# Patient Record
Sex: Female | Born: 1955
Health system: Southern US, Community
[De-identification: ages and names within clinical notes are randomized; demographics above are authoritative.]

## PROBLEM LIST (undated history)

## (undated) DIAGNOSIS — Z789 Other specified health status: Secondary | ICD-10-CM

## (undated) DIAGNOSIS — K7460 Unspecified cirrhosis of liver: Secondary | ICD-10-CM

## (undated) DIAGNOSIS — B182 Chronic viral hepatitis C: Secondary | ICD-10-CM

## (undated) DIAGNOSIS — D696 Thrombocytopenia, unspecified: Secondary | ICD-10-CM

## (undated) DIAGNOSIS — R7989 Other specified abnormal findings of blood chemistry: Secondary | ICD-10-CM

## (undated) DIAGNOSIS — R945 Abnormal results of liver function studies: Secondary | ICD-10-CM

## (undated) DIAGNOSIS — E785 Hyperlipidemia, unspecified: Secondary | ICD-10-CM

## (undated) DIAGNOSIS — B1920 Unspecified viral hepatitis C without hepatic coma: Secondary | ICD-10-CM

## (undated) HISTORY — PX: ABDOMINAL HYSTERECTOMY: SHX81

## (undated) HISTORY — DX: Thrombocytopenia, unspecified: D69.6

## (undated) HISTORY — DX: Unspecified viral hepatitis C without hepatic coma: B19.20

## (undated) HISTORY — DX: Unspecified cirrhosis of liver: K74.60

## (undated) HISTORY — PX: PARTIAL HYSTERECTOMY: SHX80

## (undated) HISTORY — PX: APPENDECTOMY: SHX54

## (undated) HISTORY — DX: Chronic viral hepatitis C: B18.2

## (undated) HISTORY — DX: Hyperlipidemia, unspecified: E78.5

## (undated) HISTORY — DX: Other specified abnormal findings of blood chemistry: R79.89

## (undated) HISTORY — DX: Abnormal results of liver function studies: R94.5

---

## 1998-09-16 ENCOUNTER — Encounter: Payer: Self-pay | Admitting: Emergency Medicine

## 1998-09-16 ENCOUNTER — Emergency Department (HOSPITAL_COMMUNITY): Admission: EM | Admit: 1998-09-16 | Discharge: 1998-09-16 | Payer: Self-pay | Admitting: Emergency Medicine

## 1998-11-28 ENCOUNTER — Emergency Department (HOSPITAL_COMMUNITY): Admission: EM | Admit: 1998-11-28 | Discharge: 1998-11-28 | Payer: Self-pay | Admitting: Emergency Medicine

## 1998-11-28 ENCOUNTER — Encounter: Payer: Self-pay | Admitting: Emergency Medicine

## 1998-11-30 ENCOUNTER — Emergency Department (HOSPITAL_COMMUNITY): Admission: EM | Admit: 1998-11-30 | Discharge: 1998-11-30 | Payer: Self-pay | Admitting: *Deleted

## 2001-07-19 ENCOUNTER — Ambulatory Visit (HOSPITAL_COMMUNITY): Admission: RE | Admit: 2001-07-19 | Discharge: 2001-07-19 | Payer: Self-pay | Admitting: Internal Medicine

## 2001-07-19 ENCOUNTER — Encounter: Payer: Self-pay | Admitting: Internal Medicine

## 2002-01-31 ENCOUNTER — Encounter: Payer: Self-pay | Admitting: Emergency Medicine

## 2002-01-31 ENCOUNTER — Emergency Department (HOSPITAL_COMMUNITY): Admission: EM | Admit: 2002-01-31 | Discharge: 2002-01-31 | Payer: Self-pay

## 2002-06-13 ENCOUNTER — Encounter: Payer: Self-pay | Admitting: Internal Medicine

## 2002-06-13 ENCOUNTER — Ambulatory Visit (HOSPITAL_COMMUNITY): Admission: RE | Admit: 2002-06-13 | Discharge: 2002-06-13 | Payer: Self-pay | Admitting: Internal Medicine

## 2002-06-13 ENCOUNTER — Encounter (INDEPENDENT_AMBULATORY_CARE_PROVIDER_SITE_OTHER): Payer: Self-pay | Admitting: Specialist

## 2002-10-10 DIAGNOSIS — B182 Chronic viral hepatitis C: Secondary | ICD-10-CM

## 2002-10-10 HISTORY — DX: Chronic viral hepatitis C: B18.2

## 2002-10-11 ENCOUNTER — Ambulatory Visit (HOSPITAL_COMMUNITY): Admission: RE | Admit: 2002-10-11 | Discharge: 2002-10-11 | Payer: Self-pay | Admitting: Internal Medicine

## 2003-04-24 ENCOUNTER — Emergency Department (HOSPITAL_COMMUNITY): Admission: EM | Admit: 2003-04-24 | Discharge: 2003-04-24 | Payer: Self-pay | Admitting: Emergency Medicine

## 2003-04-25 ENCOUNTER — Emergency Department (HOSPITAL_COMMUNITY): Admission: EM | Admit: 2003-04-25 | Discharge: 2003-04-25 | Payer: Self-pay | Admitting: Emergency Medicine

## 2003-11-25 ENCOUNTER — Emergency Department (HOSPITAL_COMMUNITY): Admission: EM | Admit: 2003-11-25 | Discharge: 2003-11-25 | Payer: Self-pay | Admitting: Emergency Medicine

## 2003-11-26 ENCOUNTER — Emergency Department (HOSPITAL_COMMUNITY): Admission: EM | Admit: 2003-11-26 | Discharge: 2003-11-26 | Payer: Self-pay | Admitting: Emergency Medicine

## 2004-06-25 ENCOUNTER — Ambulatory Visit (HOSPITAL_COMMUNITY): Admission: RE | Admit: 2004-06-25 | Discharge: 2004-06-25 | Payer: Self-pay | Admitting: Internal Medicine

## 2004-11-15 ENCOUNTER — Emergency Department (HOSPITAL_COMMUNITY): Admission: EM | Admit: 2004-11-15 | Discharge: 2004-11-15 | Payer: Self-pay | Admitting: Emergency Medicine

## 2005-09-09 ENCOUNTER — Emergency Department (HOSPITAL_COMMUNITY): Admission: EM | Admit: 2005-09-09 | Discharge: 2005-09-09 | Payer: Self-pay | Admitting: Emergency Medicine

## 2006-02-07 ENCOUNTER — Emergency Department (HOSPITAL_COMMUNITY): Admission: EM | Admit: 2006-02-07 | Discharge: 2006-02-07 | Payer: Self-pay | Admitting: Emergency Medicine

## 2006-03-24 ENCOUNTER — Emergency Department (HOSPITAL_COMMUNITY): Admission: EM | Admit: 2006-03-24 | Discharge: 2006-03-24 | Payer: Self-pay | Admitting: Emergency Medicine

## 2006-05-24 ENCOUNTER — Ambulatory Visit (HOSPITAL_COMMUNITY): Admission: RE | Admit: 2006-05-24 | Discharge: 2006-05-24 | Payer: Self-pay | Admitting: Family Medicine

## 2006-08-25 ENCOUNTER — Emergency Department (HOSPITAL_COMMUNITY): Admission: EM | Admit: 2006-08-25 | Discharge: 2006-08-25 | Payer: Self-pay | Admitting: Emergency Medicine

## 2006-10-01 ENCOUNTER — Emergency Department (HOSPITAL_COMMUNITY): Admission: EM | Admit: 2006-10-01 | Discharge: 2006-10-01 | Payer: Self-pay | Admitting: Family Medicine

## 2006-10-31 ENCOUNTER — Ambulatory Visit: Payer: Self-pay | Admitting: Gastroenterology

## 2007-02-23 ENCOUNTER — Ambulatory Visit (HOSPITAL_COMMUNITY): Admission: RE | Admit: 2007-02-23 | Discharge: 2007-02-23 | Payer: Self-pay | Admitting: Gastroenterology

## 2007-02-23 ENCOUNTER — Encounter (INDEPENDENT_AMBULATORY_CARE_PROVIDER_SITE_OTHER): Payer: Self-pay | Admitting: Interventional Radiology

## 2007-03-02 ENCOUNTER — Emergency Department (HOSPITAL_COMMUNITY): Admission: EM | Admit: 2007-03-02 | Discharge: 2007-03-02 | Payer: Self-pay | Admitting: Emergency Medicine

## 2007-05-29 ENCOUNTER — Ambulatory Visit (HOSPITAL_COMMUNITY): Admission: RE | Admit: 2007-05-29 | Discharge: 2007-05-29 | Payer: Self-pay | Admitting: Internal Medicine

## 2007-08-03 ENCOUNTER — Emergency Department (HOSPITAL_COMMUNITY): Admission: EM | Admit: 2007-08-03 | Discharge: 2007-08-03 | Payer: Self-pay | Admitting: *Deleted

## 2007-08-24 ENCOUNTER — Emergency Department (HOSPITAL_COMMUNITY): Admission: EM | Admit: 2007-08-24 | Discharge: 2007-08-24 | Payer: Self-pay | Admitting: Emergency Medicine

## 2008-05-29 ENCOUNTER — Ambulatory Visit (HOSPITAL_COMMUNITY): Admission: RE | Admit: 2008-05-29 | Discharge: 2008-05-29 | Payer: Self-pay | Admitting: Internal Medicine

## 2008-11-10 ENCOUNTER — Emergency Department (HOSPITAL_COMMUNITY): Admission: EM | Admit: 2008-11-10 | Discharge: 2008-11-10 | Payer: Self-pay | Admitting: Emergency Medicine

## 2009-05-29 ENCOUNTER — Ambulatory Visit (HOSPITAL_COMMUNITY): Admission: RE | Admit: 2009-05-29 | Discharge: 2009-05-29 | Payer: Self-pay | Admitting: Internal Medicine

## 2010-06-01 ENCOUNTER — Ambulatory Visit (HOSPITAL_COMMUNITY): Admission: RE | Admit: 2010-06-01 | Discharge: 2010-06-01 | Payer: Self-pay | Admitting: Internal Medicine

## 2010-10-31 ENCOUNTER — Encounter: Payer: Self-pay | Admitting: Gastroenterology

## 2010-10-31 ENCOUNTER — Encounter: Payer: Self-pay | Admitting: Family Medicine

## 2010-11-14 ENCOUNTER — Emergency Department (HOSPITAL_COMMUNITY): Payer: 59

## 2010-11-14 ENCOUNTER — Emergency Department (HOSPITAL_COMMUNITY)
Admission: EM | Admit: 2010-11-14 | Discharge: 2010-11-14 | Disposition: A | Discharge status: Home / Self Care | Payer: 59 | Attending: Emergency Medicine | Admitting: Emergency Medicine

## 2010-11-14 DIAGNOSIS — R05 Cough: Secondary | ICD-10-CM | POA: Insufficient documentation

## 2010-11-14 DIAGNOSIS — J069 Acute upper respiratory infection, unspecified: Secondary | ICD-10-CM | POA: Insufficient documentation

## 2010-11-14 DIAGNOSIS — R509 Fever, unspecified: Secondary | ICD-10-CM | POA: Insufficient documentation

## 2010-11-14 DIAGNOSIS — R0602 Shortness of breath: Secondary | ICD-10-CM | POA: Insufficient documentation

## 2010-11-14 DIAGNOSIS — R059 Cough, unspecified: Secondary | ICD-10-CM | POA: Insufficient documentation

## 2010-11-14 DIAGNOSIS — Z8619 Personal history of other infectious and parasitic diseases: Secondary | ICD-10-CM | POA: Insufficient documentation

## 2011-03-29 ENCOUNTER — Emergency Department (HOSPITAL_COMMUNITY): Payer: Self-pay

## 2011-03-29 ENCOUNTER — Emergency Department (HOSPITAL_COMMUNITY)
Admission: EM | Admit: 2011-03-29 | Discharge: 2011-03-29 | Disposition: A | Discharge status: Home / Self Care | Payer: Self-pay | Attending: Emergency Medicine | Admitting: Emergency Medicine

## 2011-03-29 DIAGNOSIS — R059 Cough, unspecified: Secondary | ICD-10-CM | POA: Insufficient documentation

## 2011-03-29 DIAGNOSIS — J029 Acute pharyngitis, unspecified: Secondary | ICD-10-CM | POA: Insufficient documentation

## 2011-03-29 DIAGNOSIS — J069 Acute upper respiratory infection, unspecified: Secondary | ICD-10-CM | POA: Insufficient documentation

## 2011-03-29 DIAGNOSIS — R05 Cough: Secondary | ICD-10-CM | POA: Insufficient documentation

## 2011-03-29 DIAGNOSIS — Z8619 Personal history of other infectious and parasitic diseases: Secondary | ICD-10-CM | POA: Insufficient documentation

## 2011-03-29 LAB — RAPID STREP SCREEN (MED CTR MEBANE ONLY): Streptococcus, Group A Screen (Direct): NEGATIVE

## 2011-04-22 ENCOUNTER — Other Ambulatory Visit (HOSPITAL_COMMUNITY): Payer: Self-pay | Admitting: Internal Medicine

## 2011-04-22 DIAGNOSIS — Z1231 Encounter for screening mammogram for malignant neoplasm of breast: Secondary | ICD-10-CM

## 2011-06-03 ENCOUNTER — Ambulatory Visit (HOSPITAL_COMMUNITY)
Admission: RE | Admit: 2011-06-03 | Discharge: 2011-06-03 | Disposition: A | Discharge status: Home / Self Care | Payer: Self-pay | Source: Ambulatory Visit | Attending: Internal Medicine | Admitting: Internal Medicine

## 2011-06-03 DIAGNOSIS — Z1231 Encounter for screening mammogram for malignant neoplasm of breast: Secondary | ICD-10-CM | POA: Insufficient documentation

## 2011-06-17 LAB — HM COLONOSCOPY

## 2011-08-26 ENCOUNTER — Emergency Department (HOSPITAL_COMMUNITY)
Admission: EM | Admit: 2011-08-26 | Discharge: 2011-08-26 | Disposition: A | Discharge status: Home / Self Care | Payer: Self-pay | Attending: Emergency Medicine | Admitting: Emergency Medicine

## 2011-08-26 ENCOUNTER — Encounter: Payer: Self-pay | Admitting: Emergency Medicine

## 2011-08-26 ENCOUNTER — Emergency Department (HOSPITAL_COMMUNITY): Payer: Self-pay

## 2011-08-26 DIAGNOSIS — R0602 Shortness of breath: Secondary | ICD-10-CM | POA: Insufficient documentation

## 2011-08-26 DIAGNOSIS — IMO0001 Reserved for inherently not codable concepts without codable children: Secondary | ICD-10-CM | POA: Insufficient documentation

## 2011-08-26 DIAGNOSIS — R51 Headache: Secondary | ICD-10-CM | POA: Insufficient documentation

## 2011-08-26 DIAGNOSIS — R059 Cough, unspecified: Secondary | ICD-10-CM | POA: Insufficient documentation

## 2011-08-26 DIAGNOSIS — R509 Fever, unspecified: Secondary | ICD-10-CM | POA: Insufficient documentation

## 2011-08-26 DIAGNOSIS — J069 Acute upper respiratory infection, unspecified: Secondary | ICD-10-CM | POA: Insufficient documentation

## 2011-08-26 DIAGNOSIS — R05 Cough: Secondary | ICD-10-CM | POA: Insufficient documentation

## 2011-08-26 MED ORDER — ALBUTEROL SULFATE HFA 108 (90 BASE) MCG/ACT IN AERS: 2.0000 | INHALATION_SPRAY | RESPIRATORY_TRACT | Status: DC | PRN

## 2011-08-26 MED ORDER — HYDROCOD POLST-CHLORPHEN POLST 10-8 MG/5ML PO LQCR: 5.0000 mL | Freq: Every evening | ORAL | Status: DC | PRN

## 2011-08-26 NOTE — ED Provider Notes (Signed)
History     CSN: [id number] Arrival date & time: 08/26/2011  3:39 PM   First MD Initiated Contact with Patient 08/26/11 1705      Chief Complaint  Patient presents with  . URI    (Consider location/radiation/quality/duration/timing/severity/associated sxs/prior treatment) HPI Comments: Patient reports body aches, headache, subjective fever, cough and SOB x 4 days.  Cough is sometimes productive of yellow sputum.  States this is similar to URI and bronchitis she has had in the past but is worried she has pneumonia because she recently had friends die of pneumonia.  Has been taking alka seltzer, nyquil, dayquil, and vitamin C without relief.  Has had sick contacts with similar symptoms at work.     Patient is a 55 y.o. female presenting with URI. The history is provided by the patient. No language interpreter was used.  URI The primary symptoms include headaches, cough and myalgias. The problem has not changed since onset. The onset of the illness is associated with exposure to sick contacts.    History reviewed. No pertinent past medical history.  History reviewed. No pertinent past surgical history.  History reviewed. No pertinent family history.  History  Substance Use Topics  . Smoking status: Former Games developer  . Smokeless tobacco: Not on file  . Alcohol Use: No    OB History    Grav Para Term Preterm Abortions TAB SAB Ect Mult Living                  Review of Systems  Respiratory: Positive for cough.   Musculoskeletal: Positive for myalgias.  Neurological: Positive for headaches.  All other systems reviewed and are negative.    Allergies  Penicillins  Home Medications   Current Outpatient Rx  Name Route Sig Dispense Refill  . ASPIRIN EFFERVESCENT 325 MG PO TBEF Oral Take 325 mg by mouth daily as needed. As needed for cough/cold.     Doreatha Martin COLD & FLU PO Oral Take 1 capsule by mouth daily as needed. As needed for cough/cold.     Marland Kitchen ERGOCALCIFEROL 50000  UNITS PO CAPS Oral Take 50,000 Units by mouth 2 (two) times a week. Tuesday and Thursday.     . DAYQUIL PO Oral Take 1 capsule by mouth daily as needed. As needed for cough/cold.       BP 118/90  Pulse 69  Temp(Src) 98.2 F (36.8 C) (Oral)  Resp 18  SpO2 100%  Physical Exam  Constitutional: She is oriented to person, place, and time. She appears well-developed and well-nourished.  HENT:  Mouth/Throat: Uvula is midline. No uvula swelling. No oropharyngeal exudate, posterior oropharyngeal edema or tonsillar abscesses.  Neck: Trachea normal, normal range of motion and phonation normal. Neck supple. No tracheal tenderness present. No rigidity. No tracheal deviation and normal range of motion present.  Cardiovascular: Normal rate and regular rhythm.   Pulmonary/Chest: Effort normal and breath sounds normal. No stridor. No respiratory distress. She has no wheezes. She has no rales.  Neurological: She is alert and oriented to person, place, and time.    ED Course  Procedures (including critical care time)  Labs Reviewed - No data to display Dg Chest 2 View  08/26/2011  *RADIOLOGY REPORT*  Clinical Data: Cough and shortness of breath  CHEST - 2 VIEW  Comparison: 03/29/2011  Findings: Cardiomediastinal silhouette is within normal limits. The lungs are clear. No pleural effusion.  No pneumothorax.  No acute osseous abnormality.  IMPRESSION: No acute cardiopulmonary process.  Original  Report Authenticated By: Harrel Lemon, M.D.     1. URI (upper respiratory infection)       MDM  Patient with cough and chills, body aches x 4 days + sick contacts, CXR clear.  No pneumonia.          Dillard Cannon [place], [place] 08/26/11 [phone]

## 2011-08-26 NOTE — ED Notes (Signed)
Pt discharged home. Had no further questions regarding discharge.  

## 2011-08-26 NOTE — ED Notes (Signed)
Pt presents to department for evaluation of generalized body aches, upper chest congestion, fever and productive cough. Ongoing since Sunday, no relief with OTC medications. Pt states 6/10 body aches. She is alert and oriented x4. No signs of distress.

## 2011-08-26 NOTE — ED Notes (Signed)
C/o productive cough with yellow sputum, headache, and generalized body aches since Sunday.  Taking OTC meds without relief.

## 2011-08-27 NOTE — ED Provider Notes (Signed)
Evaluation and management procedures were performed by the PA/NP under my supervision/collaboration.    Felisa Bonier, MD 08/27/11 [phone]

## 2011-09-20 ENCOUNTER — Encounter (HOSPITAL_COMMUNITY): Payer: Self-pay | Admitting: *Deleted

## 2011-09-20 ENCOUNTER — Emergency Department (HOSPITAL_COMMUNITY)
Admission: EM | Admit: 2011-09-20 | Discharge: 2011-09-20 | Disposition: A | Discharge status: Home / Self Care | Payer: BC Managed Care – PPO | Attending: Emergency Medicine | Admitting: Emergency Medicine

## 2011-09-20 DIAGNOSIS — R6889 Other general symptoms and signs: Secondary | ICD-10-CM | POA: Insufficient documentation

## 2011-09-20 DIAGNOSIS — J40 Bronchitis, not specified as acute or chronic: Secondary | ICD-10-CM

## 2011-09-20 DIAGNOSIS — R059 Cough, unspecified: Secondary | ICD-10-CM | POA: Insufficient documentation

## 2011-09-20 DIAGNOSIS — J111 Influenza due to unidentified influenza virus with other respiratory manifestations: Secondary | ICD-10-CM

## 2011-09-20 DIAGNOSIS — R05 Cough: Secondary | ICD-10-CM | POA: Insufficient documentation

## 2011-09-20 MED ORDER — HYDROCOD POLST-CHLORPHEN POLST 10-8 MG/5ML PO LQCR: 5.0000 mL | Freq: Two times a day (BID) | ORAL | Status: DC

## 2011-09-20 MED ORDER — ALBUTEROL SULFATE HFA 108 (90 BASE) MCG/ACT IN AERS: 1.0000 | INHALATION_SPRAY | Freq: Four times a day (QID) | RESPIRATORY_TRACT | Status: DC | PRN

## 2011-09-20 NOTE — ED Notes (Signed)
Pt states that for 2 weeks she has had coughing, congestion, headaches, and body aches; fever at times. Has been taking OTC meds without any relief.

## 2011-09-20 NOTE — ED Provider Notes (Signed)
History     CSN: [id number] Arrival date & time: 09/20/2011  8:35 AM   First MD Initiated Contact with Patient 09/20/11 [phone]      No chief complaint on file.   CC:  Cough and congestion   55 year old female who reports 2 weeks of cough, congestion, stuffy nose, body aches. She was seen in the ER on November 16 and had a chest x-ray. That was normal. Patient states that she works at a [employer] and is exposed to hot and cold conditions and potential sick contacts. She has had no documented fevers, no chest pain, note, shortness of breath. She is using over-the-counter medications, and albuterol with intermittent relief. Denies any vomiting. She does complain of mild sore throat. Denies any neck pain.     (Consider location/radiation/quality/duration/timing/severity/associated sxs/prior treatment) Patient is a 55 y.o. female presenting with cough.  Cough    No past medical history on file.  No past surgical history on file.  No family history on file.  History  Substance Use Topics  . Smoking status: Former Games developer  . Smokeless tobacco: Not on file  . Alcohol Use: No    OB History    Grav Para Term Preterm Abortions TAB SAB Ect Mult Living                  Review of Systems  Respiratory: Positive for cough.   All other systems reviewed and are negative.    Allergies  Penicillins  Home Medications   Current Outpatient Rx  Name Route Sig Dispense Refill  . ALBUTEROL SULFATE HFA 108 (90 BASE) MCG/ACT IN AERS Inhalation Inhale 2 puffs into the lungs every 4 (four) hours as needed for wheezing. 1 Inhaler 0  . ASPIRIN EFFERVESCENT 325 MG PO TBEF Oral Take 325 mg by mouth daily as needed. As needed for cough/cold.     Marland Kitchen HYDROCOD POLST-CHLORPHEN POLST 10-8 MG/5ML PO LQCR Oral Take 5 mLs by mouth at bedtime as needed (cough). 60 mL 0  . NYQUIL COLD & FLU PO Oral Take 1 capsule by mouth daily as needed. As needed for cough/cold.     Marland Kitchen ERGOCALCIFEROL 50000 UNITS PO  CAPS Oral Take 50,000 Units by mouth 2 (two) times a week. Tuesday and Thursday.     . DAYQUIL PO Oral Take 1 capsule by mouth daily as needed. As needed for cough/cold.       There were no vitals taken for this visit.  Physical Exam  Nursing note and vitals reviewed. Constitutional: She is oriented to person, place, and time. She appears well-developed and well-nourished. No distress.  HENT:  Head: Normocephalic and atraumatic.  Right Ear: External ear normal.  Left Ear: External ear normal.  Mouth/Throat: Oropharynx is clear and moist. No oropharyngeal exudate.  Eyes: Conjunctivae and EOM are normal. Pupils are equal, round, and reactive to light. Left eye exhibits no discharge.  Neck: Neck supple.  Cardiovascular: Normal rate and regular rhythm.  Exam reveals no gallop and no friction rub.   No murmur heard. Pulmonary/Chest: Breath sounds normal. No respiratory distress. She has no wheezes. She has no rales. She exhibits no tenderness.       Occasional dry cough. Otherwise, lungs are clear to auscultation. No wheezing, rales, or rhonchi. No retractions. No tachypnea. Overall breathing is comfortable and nonlabored.  Abdominal: Soft. Bowel sounds are normal. She exhibits no distension. There is no tenderness. There is no rebound and no guarding.  Musculoskeletal: Normal range of motion.  Lymphadenopathy:    She has no cervical adenopathy.  Neurological: She is alert and oriented to person, place, and time. No cranial nerve deficit. Coordination normal.  Skin: Skin is warm and dry. No rash noted.  Psychiatric: She has a normal mood and affect.    ED Course  Procedures (including critical care time)  Labs Reviewed - No data to display No results found.   No diagnosis found.    MDM  Pt is seen and examined;  Initial history and physical completed.  Will follow.   Previous chart and records have been reviewed thoroughly.  Clinical Data: Cough and shortness of breath  CHEST  - 2 VIEW  Comparison: 03/29/2011  Findings: Cardiomediastinal silhouette is within normal limits. The  lungs are clear. No pleural effusion. No pneumothorax. No acute  osseous abnormality.  IMPRESSION:  No acute cardiopulmonary process.  Original Report Authenticated By: Harrel Lemon, M.D.         Janel Beane A. Patrica Duel, MD 09/20/11 [phone]

## 2011-10-11 HISTORY — PX: COLONOSCOPY: SHX5424

## 2012-04-25 ENCOUNTER — Other Ambulatory Visit (HOSPITAL_COMMUNITY): Payer: Self-pay | Admitting: Internal Medicine

## 2012-04-25 DIAGNOSIS — Z1231 Encounter for screening mammogram for malignant neoplasm of breast: Secondary | ICD-10-CM

## 2012-06-04 ENCOUNTER — Ambulatory Visit (HOSPITAL_COMMUNITY)
Admission: RE | Admit: 2012-06-04 | Discharge: 2012-06-04 | Disposition: A | Discharge status: Home / Self Care | Payer: BC Managed Care – PPO | Source: Ambulatory Visit | Attending: Internal Medicine | Admitting: Internal Medicine

## 2012-06-04 DIAGNOSIS — Z1231 Encounter for screening mammogram for malignant neoplasm of breast: Secondary | ICD-10-CM

## 2012-11-15 ENCOUNTER — Emergency Department (HOSPITAL_COMMUNITY)
Admission: EM | Admit: 2012-11-15 | Discharge: 2012-11-15 | Disposition: A | Discharge status: Home / Self Care | Payer: BC Managed Care – PPO | Attending: Emergency Medicine | Admitting: Emergency Medicine

## 2012-11-15 ENCOUNTER — Emergency Department (HOSPITAL_COMMUNITY): Payer: BC Managed Care – PPO

## 2012-11-15 DIAGNOSIS — Z79899 Other long term (current) drug therapy: Secondary | ICD-10-CM | POA: Insufficient documentation

## 2012-11-15 DIAGNOSIS — R079 Chest pain, unspecified: Secondary | ICD-10-CM | POA: Insufficient documentation

## 2012-11-15 DIAGNOSIS — R509 Fever, unspecified: Secondary | ICD-10-CM | POA: Insufficient documentation

## 2012-11-15 DIAGNOSIS — J029 Acute pharyngitis, unspecified: Secondary | ICD-10-CM | POA: Insufficient documentation

## 2012-11-15 DIAGNOSIS — R059 Cough, unspecified: Secondary | ICD-10-CM | POA: Insufficient documentation

## 2012-11-15 DIAGNOSIS — R5381 Other malaise: Secondary | ICD-10-CM | POA: Insufficient documentation

## 2012-11-15 DIAGNOSIS — J069 Acute upper respiratory infection, unspecified: Secondary | ICD-10-CM

## 2012-11-15 DIAGNOSIS — R05 Cough: Secondary | ICD-10-CM | POA: Insufficient documentation

## 2012-11-15 DIAGNOSIS — Z87891 Personal history of nicotine dependence: Secondary | ICD-10-CM | POA: Insufficient documentation

## 2012-11-15 DIAGNOSIS — R5383 Other fatigue: Secondary | ICD-10-CM | POA: Insufficient documentation

## 2012-11-15 MED ORDER — IBUPROFEN 800 MG PO TABS: 800.0000 mg | ORAL_TABLET | Freq: Three times a day (TID) | ORAL | Status: DC | PRN

## 2012-11-15 MED ORDER — PREDNISONE 50 MG PO TABS: 50.0000 mg | ORAL_TABLET | Freq: Every day | ORAL | Status: DC

## 2012-11-15 MED ORDER — PROMETHAZINE-DM 6.25-15 MG/5ML PO SYRP: 5.0000 mL | ORAL_SOLUTION | Freq: Four times a day (QID) | ORAL | Status: DC | PRN

## 2012-11-15 MED ORDER — GUAIFENESIN ER 1200 MG PO TB12: 1.0000 | ORAL_TABLET | Freq: Two times a day (BID) | ORAL | Status: DC

## 2012-11-15 MED ORDER — ACETAMINOPHEN-CODEINE 120-12 MG/5ML PO SOLN: 10.0000 mL | ORAL | Status: DC | PRN

## 2012-11-15 NOTE — ED Notes (Signed)
Patient with cough, fever, pain in chest and ribs from coughing according to patient.  Denies nausea, vomiting.  Coughing up yellowish green sputum.

## 2012-11-15 NOTE — ED Provider Notes (Signed)
History     CSN: [id number]  Arrival date & time 11/15/12  1604   First MD Initiated Contact with Patient 11/15/12 1738      Chief Complaint  Patient presents with  . Cough    (Consider location/radiation/quality/duration/timing/severity/associated sxs/prior treatment) Patient is a 57 y.o. female presenting with cough.  Cough   Patient is a 57 year old female who presents today with a chief complaint of cough.  She has had this cough since Sunday.  Initially it was a dry cough and now she is producing some phlegm but no blood.  Also complains of chest soreness/pain increased with coughing and deep breaths.  Between dull and sharp pain.  No history of hypertension, hyperlipidemia or previous heart attacks.  States that she feels weak, has a sore throat and has noticed that she has been getting hot and cold.  Denies nausea, vomiting, diarrhea, constipation or anyone around her being sick.    No past medical history on file.  Past Surgical History  Procedure Date  . Abdominal hysterectomy   . Appendectomy     Family History  Problem Relation Age of Onset  . Hypertension Mother     History  Substance Use Topics  . Smoking status: Former Games developer  . Smokeless tobacco: Never Used  . Alcohol Use: No    OB History    Grav Para Term Preterm Abortions TAB SAB Ect Mult Living                  Review of Systems  Respiratory: Positive for cough.    All other systems negative except as documented in the HPI. All pertinent positives and negatives as reviewed in the HPI.  Allergies  Hydrocodone and Penicillins  Home Medications   Current Outpatient Rx  Name  Route  Sig  Dispense  Refill  . ASPIRIN EFFERVESCENT 325 MG PO TBEF   Oral   Take 325 mg by mouth daily as needed. As needed for cough/cold.          Doreatha Martin COLD & FLU PO   Oral   Take 1 capsule by mouth daily as needed. As needed for cough/cold.          Juanita Laster COLD & COUGH PO   Oral   Take 1 packet by  mouth daily as needed. For cold symptoms          . DAYQUIL PO   Oral   Take 1 capsule by mouth daily as needed. As needed for cough/cold.          . ALBUTEROL SULFATE HFA 108 (90 BASE) MCG/ACT IN AERS   Inhalation   Inhale 2 puffs into the lungs every 4 (four) hours as needed for wheezing.   1 Inhaler   0   . ALBUTEROL SULFATE HFA 108 (90 BASE) MCG/ACT IN AERS   Inhalation   Inhale 1-2 puffs into the lungs every 6 (six) hours as needed for wheezing.   1 Inhaler   0     BP 117/82  Pulse 87  Temp 98.3 F (36.8 C) (Oral)  Resp 20  Wt 135 lb (61.236 kg)  SpO2 100%  Physical Exam  Constitutional: She appears well-developed and well-nourished.  HENT:  Head: Normocephalic and atraumatic.  Right Ear: Hearing, tympanic membrane, external ear and ear canal normal.  Left Ear: Hearing, tympanic membrane, external ear and ear canal normal.  Nose: Nose normal.  Mouth/Throat: Uvula is midline, oropharynx is clear and  moist and mucous membranes are normal.  Neck:    Cardiovascular: Normal heart sounds.   Pulmonary/Chest: Effort normal. She has no wheezes. She has no rhonchi. She has no rales.  Lymphadenopathy:    She has no cervical adenopathy.    ED Course  Procedures (including critical care time)  Return here as needed. Increase your fluid intake. The patient most likely has URI based on HPI and PE.   MDM          Carlyle Dolly, PA-C 11/17/12 1506

## 2012-11-19 NOTE — ED Provider Notes (Signed)
Medical screening examination/treatment/procedure(s) were performed by non-physician practitioner and as supervising physician I was immediately available for consultation/collaboration.  Gilda Crease, MD 11/19/12 1318

## 2013-05-14 ENCOUNTER — Other Ambulatory Visit (HOSPITAL_COMMUNITY): Payer: Self-pay | Admitting: Internal Medicine

## 2013-05-14 DIAGNOSIS — Z1231 Encounter for screening mammogram for malignant neoplasm of breast: Secondary | ICD-10-CM

## 2013-06-05 ENCOUNTER — Ambulatory Visit (HOSPITAL_COMMUNITY)
Admission: RE | Admit: 2013-06-05 | Discharge: 2013-06-05 | Disposition: A | Discharge status: Home / Self Care | Payer: BC Managed Care – PPO | Source: Ambulatory Visit | Attending: Internal Medicine | Admitting: Internal Medicine

## 2013-06-05 ENCOUNTER — Ambulatory Visit (HOSPITAL_COMMUNITY): Payer: BC Managed Care – PPO

## 2013-06-05 DIAGNOSIS — Z1231 Encounter for screening mammogram for malignant neoplasm of breast: Secondary | ICD-10-CM | POA: Insufficient documentation

## 2013-07-05 ENCOUNTER — Encounter (HOSPITAL_COMMUNITY): Payer: Self-pay | Admitting: *Deleted

## 2013-07-05 ENCOUNTER — Inpatient Hospital Stay (HOSPITAL_COMMUNITY)
Admission: AD | Admit: 2013-07-05 | Discharge: 2013-07-05 | Disposition: A | Discharge status: Home / Self Care | Payer: BC Managed Care – PPO | Source: Ambulatory Visit | Attending: Obstetrics & Gynecology | Admitting: Obstetrics & Gynecology

## 2013-07-05 DIAGNOSIS — J069 Acute upper respiratory infection, unspecified: Secondary | ICD-10-CM | POA: Insufficient documentation

## 2013-07-05 DIAGNOSIS — B9789 Other viral agents as the cause of diseases classified elsewhere: Secondary | ICD-10-CM | POA: Insufficient documentation

## 2013-07-05 DIAGNOSIS — R059 Cough, unspecified: Secondary | ICD-10-CM | POA: Insufficient documentation

## 2013-07-05 DIAGNOSIS — R05 Cough: Secondary | ICD-10-CM | POA: Insufficient documentation

## 2013-07-05 HISTORY — DX: Other specified health status: Z78.9

## 2013-07-05 MED ORDER — GUAIFENESIN-CODEINE 100-10 MG/5ML PO SOLN: 5.0000 mL | Freq: Three times a day (TID) | ORAL | Status: DC | PRN

## 2013-07-05 MED ORDER — GUAIFENESIN-CODEINE 100-10 MG/5ML PO SOLN
5.0000 mL | Freq: Once | ORAL | Status: AC
  Administered 2013-07-05: 5 mL via ORAL
  Filled 2013-07-05: qty 5

## 2013-07-05 NOTE — MAU Provider Note (Signed)
Attestation of Attending Supervision of Advanced Practitioner (CNM/NP): Evaluation and management procedures were performed by the Advanced Practitioner under my supervision and collaboration.  I have reviewed the Advanced Practitioner's note and chart, and I agree with the management and plan.  HARRAWAY-SMITH, Shah Insley 3:50 PM     

## 2013-07-05 NOTE — MAU Provider Note (Signed)
  History     CSN: [id number]  Arrival date and time: 07/05/13 1417   First Provider Initiated Contact with Patient 07/05/13 1508      Chief Complaint  Patient presents with  . Cough   Cough    Taylor Santiago is a 57 y.o. who presents today with cough since Wednesday. She has had sneezing, runny nose and sore throat for one week. She has been taking Nyquil, Alka-seltzer cold, Dayquil, Delsym. She has also been drinking hot tea. She states that she gets a respiratory infection about twice a year. She goes to [facility]. She has to ride the bus, and wanted to come to the closest place.   Past Medical History  Diagnosis Date  . Medical history non-contributory     Past Surgical History  Procedure Laterality Date  . Abdominal hysterectomy    . Appendectomy      Family History  Problem Relation Age of Onset  . Hypertension Mother     History  Substance Use Topics  . Smoking status: Former Games developer  . Smokeless tobacco: Never Used  . Alcohol Use: No    Allergies:  Allergies  Allergen Reactions  . Hydrocodone Itching  . Penicillins Hives    sweats    Prescriptions prior to admission  Medication Sig Dispense Refill  . albuterol (PROVENTIL HFA;VENTOLIN HFA) 108 (90 BASE) MCG/ACT inhaler Inhale 2 puffs into the lungs every 4 (four) hours as needed for wheezing.  1 Inhaler  0  . albuterol (PROVENTIL HFA;VENTOLIN HFA) 108 (90 BASE) MCG/ACT inhaler Inhale 1-2 puffs into the lungs every 6 (six) hours as needed for wheezing.  1 Inhaler  0  . aspirin-sod bicarb-citric acid (ALKA-SELTZER) 325 MG TBEF Take 325 mg by mouth daily as needed. As needed for cough/cold.       Marland Kitchen DM-Doxylamine-Acetaminophen (NYQUIL COLD & FLU PO) Take 1 capsule by mouth daily as needed. As needed for cough/cold.       . Guaifenesin 1200 MG TB12 Take 1 tablet (1,200 mg total) by mouth 2 (two) times daily.  20 each  0  . Phenylephrine-Pheniramine-DM (THERAFLU COLD & COUGH PO) Take 1 packet by  mouth daily as needed. For cold symptoms       . Pseudoephedrine-APAP-DM (DAYQUIL PO) Take 1 capsule by mouth daily as needed. As needed for cough/cold.         Review of Systems  Respiratory: Positive for cough.    Physical Exam   Blood pressure 126/91, pulse 93, temperature 98.4 F (36.9 C), temperature source Oral, resp. rate 16, SpO2 100.00%.  Physical Exam  Nursing note and vitals reviewed. Constitutional: She is oriented to person, place, and time. She appears well-developed and well-nourished. No distress.  Cardiovascular: Normal rate.   Respiratory: Effort normal. No respiratory distress. She has no wheezes. She has no rales. She exhibits no tenderness.  Lymphadenopathy:    She has no cervical adenopathy.  Neurological: She is alert and oriented to person, place, and time.  Skin: Skin is warm and dry.  Psychiatric: She has a normal mood and affect.    MAU Course  Procedures    Assessment and Plan   1. Viral URI with cough    Comfort measures Reviewed Antibiotic nonuse discussed FU with PCP if symptoms last longer than 10 days.    Tawnya Crook 07/05/2013, 3:14 PM

## 2013-07-05 NOTE — MAU Note (Signed)
Patient states she started having a sore throat about one week ago that has gotten progressively worse and now has a bad cough. Body aches. Has bronchitis every year.

## 2013-11-14 ENCOUNTER — Emergency Department (HOSPITAL_COMMUNITY)
Admission: EM | Admit: 2013-11-14 | Discharge: 2013-11-14 | Disposition: A | Discharge status: Home / Self Care | Payer: BC Managed Care – PPO | Attending: Emergency Medicine | Admitting: Emergency Medicine

## 2013-11-14 ENCOUNTER — Encounter (HOSPITAL_COMMUNITY): Payer: Self-pay | Admitting: Emergency Medicine

## 2013-11-14 DIAGNOSIS — J069 Acute upper respiratory infection, unspecified: Secondary | ICD-10-CM | POA: Insufficient documentation

## 2013-11-14 DIAGNOSIS — Z79899 Other long term (current) drug therapy: Secondary | ICD-10-CM | POA: Insufficient documentation

## 2013-11-14 DIAGNOSIS — B9789 Other viral agents as the cause of diseases classified elsewhere: Secondary | ICD-10-CM

## 2013-11-14 DIAGNOSIS — Z87891 Personal history of nicotine dependence: Secondary | ICD-10-CM | POA: Insufficient documentation

## 2013-11-14 DIAGNOSIS — Z880 Allergy status to penicillin: Secondary | ICD-10-CM | POA: Insufficient documentation

## 2013-11-14 LAB — RAPID STREP SCREEN (MED CTR MEBANE ONLY): Streptococcus, Group A Screen (Direct): NEGATIVE

## 2013-11-14 MED ORDER — HYDROCOD POLST-CHLORPHEN POLST 10-8 MG/5ML PO LQCR: 5.0000 mL | Freq: Two times a day (BID) | ORAL | Status: DC | PRN

## 2013-11-14 NOTE — ED Notes (Signed)
Pt with scratchy throat, body aches and upper respiratory congestion x 2 weeks.  Denies fevers nvd.  VS stable.

## 2013-11-14 NOTE — Discharge Instructions (Signed)
Take ibuprofen w/ food up to three times a day, as needed for pain, fever and chills.  Take sudafed as needed for nasal/sinus congestion.  Take tussionex at night as needed for cough and severe pain.  Do not drive within four hours of taking this medication (may cause drowsiness or confusion).    Get rest, drink plenty of fluids and wash hands often to prevent spread of infection.  You should return to the ER if your cough worsens, you develop difficulty breathing or pain in your chest or symptoms have not started to improve in 5-7days.

## 2013-11-14 NOTE — ED Provider Notes (Signed)
CSN: [id number]     Arrival date & time 11/14/13  1214 History   First MD Initiated Contact with Patient 11/14/13 1401     Chief Complaint  Patient presents with  . Generalized Body Aches  . URI   (Consider location/radiation/quality/duration/timing/severity/associated sxs/prior Treatment) HPI History provided by pt.   Pt presents w/ body aches x 5 days.  Associated w/ chills, headache, nasal congestion, rhinorrhea, sore throat, productive cough, generalized weakness and fatigue.  Denies dyspnea, chest pain, vomiting and diarrhea.  Has been taking several OTC cough and cough medications w/out relief.  No known sick contacts but takes the bus frequently.  No PMH.  Past Medical History  Diagnosis Date  . Medical history non-contributory    Past Surgical History  Procedure Laterality Date  . Abdominal hysterectomy    . Appendectomy     Family History  Problem Relation Age of Onset  . Hypertension Mother    History  Substance Use Topics  . Smoking status: Former Research scientist (life sciences)  . Smokeless tobacco: Never Used  . Alcohol Use: No   OB History   Grav Para Term Preterm Abortions TAB SAB Ect Mult Living   1 1        1      Review of Systems  All other systems reviewed and are negative.    Allergies  Hydrocodone and Penicillins  Home Medications   Current Outpatient Rx  Name  Route  Sig  Dispense  Refill  . albuterol (PROVENTIL HFA;VENTOLIN HFA) 108 (90 BASE) MCG/ACT inhaler   Inhalation   Inhale 2 puffs into the lungs every 6 (six) hours as needed for wheezing or shortness of breath (rescue).         Marland Kitchen aspirin-sod bicarb-citric acid (ALKA-SELTZER) 325 MG TBEF   Oral   Take 325 mg by mouth at bedtime as needed (for cold/flu symptoms).          . Chlorphen-Pseudoephed-APAP (THERAFLU FLU/COLD/SORE THROAT PO)   Oral   Take 1 packet by mouth daily as needed (for cold).         Marland Kitchen ibuprofen (ADVIL,MOTRIN) 200 MG tablet   Oral   Take 400 mg by mouth every 6 (six) hours as  needed.         . naproxen sodium (ANAPROX) 220 MG tablet   Oral   Take 440 mg by mouth daily as needed (for pain).         . Pseudoeph-Doxylamine-DM-APAP (NYQUIL MULTI-SYMPTOM PO)   Oral   Take 2 capsules by mouth daily as needed (for cold).          BP 145/104  Pulse 76  Temp(Src) 98.7 F (37.1 C) (Oral)  Resp 18  SpO2 99% Physical Exam  Nursing note and vitals reviewed. Constitutional: She is oriented to person, place, and time. She appears well-developed and well-nourished. No distress.  HENT:  Head: Normocephalic and atraumatic.  No erythema, edema or exudate of posterior pharynx or tonsils.  Uvula mid-line.  No trismus.    Eyes:  Normal appearance  Neck: Normal range of motion.  Cardiovascular: Normal rate and regular rhythm.   Pulmonary/Chest: Effort normal and breath sounds normal. No respiratory distress. She exhibits tenderness.  Musculoskeletal: Normal range of motion.  Tenderness of muscles of arms w/out guarding  Lymphadenopathy:    She has cervical adenopathy.  Neurological: She is alert and oriented to person, place, and time.  Skin: Skin is warm and dry. No rash noted.  Psychiatric: She has a  normal mood and affect. Her behavior is normal.    ED Course  Procedures (including critical care time) Labs Review Labs Reviewed  RAPID STREP SCREEN  CULTURE, GROUP A STREP   Imaging Review No results found.  EKG Interpretation   None       MDM   1. Viral URI with cough    Healthy 58yo F presents w/ respiratory illness x 5 days.  Afebrile, NAD, well-hydrated, unremarkable ENT, nml breath sounds on exam.  Suspect influenza.  Prescribed tussionex for cough and body aches and recommended tylenol/motrin prn, fluids, rest and sudafed.  Pt aware that triage BP elevated and that she should follow up with her PCP for recheck.  Return precautions discussed.       Remer Macho, PA-C 11/14/13 [phone]

## 2013-11-14 NOTE — ED Provider Notes (Signed)
Medical screening examination/treatment/procedure(s) were performed by non-physician practitioner and as supervising physician I was immediately available for consultation/collaboration.  EKG Interpretation   None       Rolland Porter, MD, Abram Sander   Janice Norrie, MD 11/14/13 [phone]

## 2013-11-14 NOTE — ED Notes (Signed)
Pt sts she has tried aleve, [employer], Nyquil, and theraflu without relief.

## 2013-11-16 LAB — CULTURE, GROUP A STREP

## 2014-03-12 ENCOUNTER — Other Ambulatory Visit: Payer: Self-pay | Admitting: Nurse Practitioner

## 2014-03-12 DIAGNOSIS — C220 Liver cell carcinoma: Secondary | ICD-10-CM

## 2014-04-25 ENCOUNTER — Other Ambulatory Visit: Payer: Self-pay

## 2014-04-25 ENCOUNTER — Emergency Department (HOSPITAL_COMMUNITY)
Admission: EM | Admit: 2014-04-25 | Discharge: 2014-04-25 | Disposition: A | Discharge status: Home / Self Care | Payer: BC Managed Care – PPO | Attending: Emergency Medicine | Admitting: Emergency Medicine

## 2014-04-25 ENCOUNTER — Encounter (HOSPITAL_COMMUNITY): Payer: Self-pay | Admitting: Emergency Medicine

## 2014-04-25 ENCOUNTER — Emergency Department (HOSPITAL_COMMUNITY): Payer: BC Managed Care – PPO

## 2014-04-25 DIAGNOSIS — R079 Chest pain, unspecified: Secondary | ICD-10-CM | POA: Insufficient documentation

## 2014-04-25 DIAGNOSIS — Z79899 Other long term (current) drug therapy: Secondary | ICD-10-CM | POA: Insufficient documentation

## 2014-04-25 DIAGNOSIS — Z791 Long term (current) use of non-steroidal anti-inflammatories (NSAID): Secondary | ICD-10-CM | POA: Insufficient documentation

## 2014-04-25 DIAGNOSIS — Z880 Allergy status to penicillin: Secondary | ICD-10-CM | POA: Insufficient documentation

## 2014-04-25 DIAGNOSIS — G5630 Lesion of radial nerve, unspecified upper limb: Secondary | ICD-10-CM | POA: Insufficient documentation

## 2014-04-25 DIAGNOSIS — G5632 Lesion of radial nerve, left upper limb: Secondary | ICD-10-CM

## 2014-04-25 DIAGNOSIS — R61 Generalized hyperhidrosis: Secondary | ICD-10-CM | POA: Insufficient documentation

## 2014-04-25 DIAGNOSIS — Z87891 Personal history of nicotine dependence: Secondary | ICD-10-CM | POA: Insufficient documentation

## 2014-04-25 LAB — BASIC METABOLIC PANEL
ANION GAP: 13 (ref 5–15)
BUN: 8 mg/dL (ref 6–23)
CHLORIDE: 102 meq/L (ref 96–112)
CO2: 25 meq/L (ref 19–32)
Calcium: 8.6 mg/dL (ref 8.4–10.5)
Creatinine, Ser: 0.77 mg/dL (ref 0.50–1.10)
GFR calc Af Amer: >90 mL/min (ref >90)
GFR calc non Af Amer: >90 mL/min (ref >90)
Glucose, Bld: 147 mg/dL — ABNORMAL HIGH (ref 70–99)
Potassium: 4.3 mEq/L (ref 3.7–5.3)
SODIUM: 140 meq/L (ref 137–147)

## 2014-04-25 LAB — I-STAT TROPONIN, ED
TROPONIN I, POC: 0 ng/mL (ref 0.00–0.08)
Troponin i, poc: 0 ng/mL (ref 0.00–0.08)

## 2014-04-25 LAB — CBC
HCT: 33.1 % — ABNORMAL LOW (ref 36.0–46.0)
HEMOGLOBIN: 11.1 g/dL — AB (ref 12.0–15.0)
MCH: 31.4 pg (ref 26.0–34.0)
MCHC: 33.5 g/dL (ref 30.0–36.0)
MCV: 93.5 fL (ref 78.0–100.0)
PLATELETS: 76 10*3/uL — AB (ref 150–400)
RBC: 3.54 MIL/uL — AB (ref 3.87–5.11)
RDW: 13.3 % (ref 11.5–15.5)
WBC: 4.1 10*3/uL (ref 4.0–10.5)

## 2014-04-25 LAB — D-DIMER, QUANTITATIVE: D-Dimer, Quant: <0.27 ug/mL-FEU (ref 0.00–0.48)

## 2014-04-25 MED ORDER — ONDANSETRON 4 MG PO TBDP
8.0000 mg | ORAL_TABLET | Freq: Once | ORAL | Status: AC
  Administered 2014-04-25: 8 mg via ORAL
  Filled 2014-04-25: qty 2

## 2014-04-25 MED ORDER — OXYCODONE-ACETAMINOPHEN 5-325 MG PO TABS
2.0000 | ORAL_TABLET | Freq: Once | ORAL | Status: AC
  Administered 2014-04-25: 2 via ORAL
  Filled 2014-04-25: qty 2

## 2014-04-25 NOTE — ED Provider Notes (Signed)
Complains of anterior chest pain, improved by deep inspiration onset yesterday last 10 minutes at a time. Pain is nonradiating patient also awakened this morning with a left wrist drop and and numbness in her entire right arm on exam patient is alert Glasgow Coma Score 15 HEENT exam no facial asymmetry neck supple trachea midline lungs clear auscultation heart regular rate and rhythm. Abdomen nondistended nontender. Left upper extremity no swelling no deformity. He does have full range of motion with the exception of the left wrist which is unable to extend. Radial pulse 2+. Good flexion of fingers.  Heart score equals 2 based on risk factors i.e. family history, with MI in her brother in his 26s and father in his 24s ex-smoker quit 8 years ago  Orlie Dakin, MD 04/25/14 1227

## 2014-04-25 NOTE — ED Notes (Signed)
She said she felt numbness in her left arm and pain in her chest throughout the night last night. Today when she got to work she became "shaky and the pain started throbbing." shes a&ox4, resp e/u

## 2014-04-25 NOTE — ED Notes (Signed)
Ortho paged and returned page.  

## 2014-04-25 NOTE — ED Provider Notes (Signed)
CSN: [id number]     Arrival date & time 04/25/14  7846 History   First MD Initiated Contact with Patient 04/25/14 1015     Chief Complaint  Patient presents with  . Chest Pain     (Consider location/radiation/quality/duration/timing/severity/associated sxs/prior Treatment) HPI Comments: Patient is a 58 year old right hand dominant female who presents to the emergency department today with arm numbness which began when she woke up this morning. She reports this was around 5am and she is currently unable to extend her left wrist. She has associated throbbing extending up her arm. Around 830 or 9 this morning she developed chest pian which was associated with diaphoresis. This lasted approximately 15 minutes. She describes the chest pain only as a "hard hurt". She states that breathing improves her pain. She is wondering if her chest pain is related to anxiety about her arm. She denies any history of CAD. She does have a positive family history of early heart disease with her brother having an MI in his late 19s. Patient is not a smoker.  The history is provided by the patient. No language interpreter was used.    Past Medical History  Diagnosis Date  . Medical history non-contributory    Past Surgical History  Procedure Laterality Date  . Abdominal hysterectomy    . Appendectomy     Family History  Problem Relation Age of Onset  . Hypertension Mother    History  Substance Use Topics  . Smoking status: Former Research scientist (life sciences)  . Smokeless tobacco: Never Used  . Alcohol Use: No   OB History   Grav Para Term Preterm Abortions TAB SAB Ect Mult Living   1 1        1      Review of Systems  Constitutional: Positive for diaphoresis. Negative for fever and chills.  Respiratory: Negative for cough, chest tightness and shortness of breath.   Cardiovascular: Positive for chest pain.  Gastrointestinal: Negative for nausea, vomiting and abdominal pain.  Musculoskeletal: Positive for myalgias.   Neurological: Positive for weakness and numbness.  All other systems reviewed and are negative.     Allergies  Hydrocodone and Penicillins  Home Medications   Prior to Admission medications   Medication Sig Start Date End Date Taking? Authorizing Provider  albuterol (PROVENTIL HFA;VENTOLIN HFA) 108 (90 BASE) MCG/ACT inhaler Inhale 2 puffs into the lungs every 6 (six) hours as needed for wheezing or shortness of breath (rescue).   Yes Historical Provider, MD  Cholecalciferol (VITAMIN D3) 5000 UNITS CAPS Take 5,000 Units by mouth 2 (two) times a week.   Yes Historical Provider, MD  ibuprofen (ADVIL,MOTRIN) 200 MG tablet Take 400 mg by mouth every 6 (six) hours as needed for fever, headache or mild pain.    Yes Historical Provider, MD  naproxen sodium (ANAPROX) 220 MG tablet Take 440 mg by mouth daily as needed (for pain).   Yes Historical Provider, MD   BP 121/70  Pulse 73  Temp(Src) 98 F (36.7 C) (Oral)  Resp 15  Ht 5\' 7"  (1.702 m)  Wt 122 lb (55.339 kg)  BMI 19.10 kg/m2  SpO2 100% Physical Exam  Nursing note and vitals reviewed. Constitutional: She is oriented to person, place, and time. She appears well-developed and well-nourished. No distress.  HENT:  Head: Normocephalic and atraumatic.  Right Ear: External ear normal.  Left Ear: External ear normal.  Nose: Nose normal.  Mouth/Throat: Oropharynx is clear and moist.  Eyes: Conjunctivae are normal.  Neck:  Normal range of motion.  Cardiovascular: Normal rate, regular rhythm, normal heart sounds, intact distal pulses and normal pulses.   Pulses:      Radial pulses are 2+ on the right side, and 2+ on the left side.       Posterior tibial pulses are 2+ on the right side, and 2+ on the left side.  Capillary refill < 3 seconds in all fingers  Pulmonary/Chest: Effort normal and breath sounds normal. No stridor. No respiratory distress. She has no wheezes. She has no rales.  Abdominal: Soft. She exhibits no distension.   Musculoskeletal: Normal range of motion.  Patient unable to extend left wrist.   Neurological: She is alert and oriented to person, place, and time. A sensory deficit is present. GCS eye subscore is 4. GCS verbal subscore is 5. GCS motor subscore is 6.  Sensation intact in first and fifth finger. Decreased sensation in 2-4.   Skin: Skin is warm and dry. She is not diaphoretic. No erythema.  Psychiatric: She has a normal mood and affect. Her behavior is normal.    ED Course  Procedures (including critical care time) Labs Review Labs Reviewed  CBC - Abnormal; Notable for the following:    RBC 3.54 (*)    Hemoglobin 11.1 (*)    HCT 33.1 (*)    Platelets 76 (*)    All other components within normal limits  BASIC METABOLIC PANEL - Abnormal; Notable for the following:    Glucose, Bld 147 (*)    All other components within normal limits  D-DIMER, QUANTITATIVE  I-STAT TROPOININ, ED  Randolm Idol, ED    Imaging Review Dg Chest 2 View  04/25/2014   CLINICAL DATA:  Chest and left arm pain.  History of hepatitis.  EXAM: CHEST  2 VIEW  COMPARISON:  PA and lateral chest 11/15/2012.  FINDINGS: The lungs are clear. Heart size is upper normal. No pneumothorax or pleural effusion. No focal bony abnormality.  IMPRESSION: No acute finding.  Stable compared to prior exam.   Electronically Signed   By: Inge Rise M.D.   On: 04/25/2014 09:56     EKG Interpretation   Date/Time:  Friday April 25 2014 09:09:15 EDT Ventricular Rate:  83 PR Interval:  176 QRS Duration: 66 QT Interval:  368 QTC Calculation: 432 R Axis:   66 Text Interpretation:  Normal sinus rhythm Cannot rule out Anterior infarct  , age undetermined Abnormal ECG ED PHYSICIAN INTERPRETATION AVAILABLE IN  [facility] [address] Confirmed by TEST, Record ([id number]) on 04/27/2014 8:49:11 AM      2:21 PM Left message for Dr. Baird Cancer. This was attempt #4 to contact her office. Left call back number.   2:53 PM Tried to call Dr. Baird Cancer  again as I am hoping to discharge patient soon. No answer and unable to leave additional message.  2:56 PM Discussed this case with Dr. Baird Cancer' MA. Dr. Baird Cancer will be able to see patient July 22 at noon.   MDM   Final diagnoses:  Chest pain, unspecified chest pain type  Radial nerve palsy, left    Patient presents to ED with chest pain and a radial nerve palsy on her left side. Patient placed in volar short arm splint and instructed to follow up with hand surgery. Chest pain is unlikely cardiac. Heart score puts patient in low category. I called patient's PCP office to ensure close follow up. Patient has appointment on July 22 at noon. Discussed reasons to return to ED immediately.  Vital signs stable for discharge. Dr. Winfred Leeds evaluated patient and agrees with plan. Patient / Family / Caregiver informed of clinical course, understand medical decision-making process, and agree with plan.     Elwyn Lade, PA-C 04/28/14 1929

## 2014-04-25 NOTE — Discharge Instructions (Signed)
Chest Pain (Nonspecific) °It is often hard to give a specific diagnosis for the cause of chest pain. There is always a chance that your pain could be related to something serious, such as a heart attack or a blood clot in the lungs. You need to follow up with your health care provider for further evaluation. °CAUSES  °· Heartburn. °· Pneumonia or bronchitis. °· Anxiety or stress. °· Inflammation around your heart (pericarditis) or lung (pleuritis or pleurisy). °· A blood clot in the lung. °· A collapsed lung (pneumothorax). It can develop suddenly on its own (spontaneous pneumothorax) or from trauma to the chest. °· Shingles infection (herpes zoster virus). °The chest wall is composed of bones, muscles, and cartilage. Any of these can be the source of the pain. °· The bones can be bruised by injury. °· The muscles or cartilage can be strained by coughing or overwork. °· The cartilage can be affected by inflammation and become sore (costochondritis). °DIAGNOSIS  °Lab tests or other studies may be needed to find the cause of your pain. Your health care provider may have you take a test called an ambulatory electrocardiogram (ECG). An ECG records your heartbeat patterns over a 24-hour period. You may also have other tests, such as: °· Transthoracic echocardiogram (TTE). During echocardiography, sound waves are used to evaluate how blood flows through your heart. °· Transesophageal echocardiogram (TEE). °· Cardiac monitoring. This allows your health care provider to monitor your heart rate and rhythm in real time. °· Holter monitor. This is a portable device that records your heartbeat and can help diagnose heart arrhythmias. It allows your health care provider to track your heart activity for several days, if needed. °· Stress tests by exercise or by giving medicine that makes the heart beat faster. °TREATMENT  °· Treatment depends on what may be causing your chest pain. Treatment may include: °¨ Acid blockers for  heartburn. °¨ Anti-inflammatory medicine. °¨ Pain medicine for inflammatory conditions. °¨ Antibiotics if an infection is present. °· You may be advised to change lifestyle habits. This includes stopping smoking and avoiding alcohol, caffeine, and chocolate. °· You may be advised to keep your head raised (elevated) when sleeping. This reduces the chance of acid going backward from your stomach into your esophagus. °Most of the time, nonspecific chest pain will improve within 2-3 days with rest and mild pain medicine.  °HOME CARE INSTRUCTIONS  °· If antibiotics were prescribed, take them as directed. Finish them even if you start to feel better. °· For the next few days, avoid physical activities that bring on chest pain. Continue physical activities as directed. °· Do not use any tobacco products, including cigarettes, chewing tobacco, or electronic cigarettes. °· Avoid drinking alcohol. °· Only take medicine as directed by your health care provider. °· Follow your health care provider's suggestions for further testing if your chest pain does not go away. °· Keep any follow-up appointments you made. If you do not go to an appointment, you could develop lasting (chronic) problems with pain. If there is any problem keeping an appointment, call to reschedule. °SEEK MEDICAL CARE IF:  °· Your chest pain does not go away, even after treatment. °· You have a rash with blisters on your chest. °· You have a fever. °SEEK IMMEDIATE MEDICAL CARE IF:  °· You have increased chest pain or pain that spreads to your arm, neck, jaw, back, or abdomen. °· You have shortness of breath. °· You have an increasing cough, or you cough   up blood.  You have severe back or abdominal pain.  You feel nauseous or vomit.  You have severe weakness.  You faint.  You have chills. This is an emergency. Do not wait to see if the pain will go away. Get medical help at once. Call your local emergency services (911 in [place] Do not drive  yourself to the hospital. MAKE SURE YOU:   Understand these instructions.  Will watch your condition.  Will get help right away if you are not doing well or get worse. Document Released: 07/06/2005 Document Revised: 10/01/2013 Document Reviewed: 05/01/2008 [facility] Patient Information 2015 [place], [place]. This information is not intended to replace advice given to you by your health care provider. Make sure you discuss any questions you have with your health care provider.  Radial Nerve Palsy Wrist drop is also known as radial nerve palsy. It is a condition in which you can not extend your wrist. This means if you are standing with your elbow bent at a right angle and with the top of your hand pointed at the ceiling, you can not hold your hand up. It falls toward the floor.  This action of extending your wrist is caused by the muscles in the back of your arm. These muscles are controlled by the radial nerve. This means that anything affecting the radial nerve so it can not tell the muscles how to work will cause wrist drop. This is medically called radial nerve palsy. Also the radial nerve is a motor and sensory nerve so anything affecting it causes problems with movement and feeling. CAUSES  Some more common causes of wrist drop are:  A break (fracture) of the large bone in the arm between your shoulder and your elbow (humerus). This is because the radial nerve winds around the humerus.  Improper use of crutches causes this because the radial nerve runs through the armpit (axilla). Crutches which are too long can put pressure on the nerve. This is sometimes called crutch palsy.  Falling asleep with your arm over a chair and supported on the back is a common cause. This is sometimes called Saturday Night Syndrome.  Wrist drop can be associated with lead poisoning because of the effect of lead on the radial nerve. SYMPTOMS  The wrist drop is an obvious problem, but there may also be numbness  in the back of the arm, forearm or hand which provides feeling in these areas by the radial nerve. There can be difficulty straightening out the elbow in addition to the wrist. There may be numbness, tingling, pain, burning sensations or other abnormal feelings. Symptoms depend entirely on where the radial nerve is injured. DIAGNOSIS   Wrist drop is obvious just by looking at it. Your caregiver may make the diagnosis by taking your history and doing a couple tests.  One test which may be done is a nerve conduction study. This test shows if the radial nerve is conducting signals well. If not, it can determine where the nerve problem is.  Sometimes X-ray studies are done. Your caregiver will determine if further testing needs to be done. TREATMENT   Usually if the problem is found to be pressure on the nerve, simply removing the pressure will allow the nerve to go back to normal in a few weeks to a few months. Other treatments will depend upon the cause found.  Only take over-the-counter or prescription medicines for pain, discomfort, or fever as directed by your caregiver.  Sometimes seizure medications  are used.  Steroids are sometimes given to decrease swelling if it is thought to be a possible cause. Document Released: 06/02/2006 Document Revised: 12/19/2011 Document Reviewed: 07/13/2006 [facility] Patient Information 2015 [place], [place]. This information is not intended to replace advice given to you by your health care provider. Make sure you discuss any questions you have with your health care provider.

## 2014-04-25 NOTE — Progress Notes (Signed)
Orthopedic Tech Progress Note Patient Details:  Taylor Santiago 03-Feb-1956 [id number]  Ortho Devices Type of Ortho Device: Ace wrap;Volar splint Ortho Device/Splint Interventions: Application   [name], [name] 04/25/2014, 3:34 PM

## 2014-05-01 NOTE — ED Provider Notes (Signed)
Medical screening examination/treatment/procedure(s) were conducted as a shared visit with non-physician practitioner(s) and myself.  I personally evaluated the patient during the encounter.   EKG Interpretation   Date/Time:  Friday April 25 2014 09:09:15 EDT Ventricular Rate:  83 PR Interval:  176 QRS Duration: 66 QT Interval:  368 QTC Calculation: 432 R Axis:   66 Text Interpretation:  Normal sinus rhythm Cannot rule out Anterior infarct  , age undetermined Abnormal ECG ED PHYSICIAN INTERPRETATION AVAILABLE IN  [facility] [place] Confirmed by TEST, Record ([id number]) on 04/27/2014 [place], MD 05/01/14 0263

## 2014-05-07 ENCOUNTER — Ambulatory Visit
Admission: RE | Admit: 2014-05-07 | Discharge: 2014-05-07 | Disposition: A | Discharge status: Home / Self Care | Payer: BC Managed Care – PPO | Source: Ambulatory Visit | Attending: Nurse Practitioner | Admitting: Nurse Practitioner

## 2014-05-07 DIAGNOSIS — C220 Liver cell carcinoma: Secondary | ICD-10-CM

## 2014-05-15 ENCOUNTER — Ambulatory Visit (INDEPENDENT_AMBULATORY_CARE_PROVIDER_SITE_OTHER): Payer: BC Managed Care – PPO | Admitting: Neurology

## 2014-05-15 ENCOUNTER — Encounter: Payer: Self-pay | Admitting: Neurology

## 2014-05-15 VITALS — BP 131/72 | HR 78 | Ht 66.0 in | Wt 123.2 lb

## 2014-05-15 DIAGNOSIS — R200 Anesthesia of skin: Secondary | ICD-10-CM

## 2014-05-15 DIAGNOSIS — B182 Chronic viral hepatitis C: Secondary | ICD-10-CM

## 2014-05-15 DIAGNOSIS — Z8619 Personal history of other infectious and parasitic diseases: Secondary | ICD-10-CM | POA: Insufficient documentation

## 2014-05-15 DIAGNOSIS — R202 Paresthesia of skin: Secondary | ICD-10-CM

## 2014-05-15 DIAGNOSIS — M6281 Muscle weakness (generalized): Secondary | ICD-10-CM

## 2014-05-15 DIAGNOSIS — R29898 Other symptoms and signs involving the musculoskeletal system: Secondary | ICD-10-CM | POA: Insufficient documentation

## 2014-05-15 DIAGNOSIS — R209 Unspecified disturbances of skin sensation: Secondary | ICD-10-CM

## 2014-05-15 MED ORDER — ASPIRIN 81 MG PO CHEW: 81.0000 mg | CHEWABLE_TABLET | Freq: Every day | ORAL | Status: AC

## 2014-05-15 NOTE — Progress Notes (Signed)
[facility] [address] [place]. [place] [address] [phone]       OFFICE CONSULT NOTE  Taylor Santiago Date of Birth:  04/22/1956 Medical Record Number:  [id number]   Referring MD: Priscille Loveless, FNP  Reason for Referral:  Radial nerve palsy   HPI :Taylor Santiago is a 34 year African American lady who developed sudden onset of left upper extremity numbness as well as weakness when she woke up one day 3 weeks ago. She states that her whole arm was numb from the shoulder down but more involving her fingers and hand. She also told moving her fingers and formed with holding objects. For last couple of weeks this then seems to have improved but she still has numbness persisting in all 5 fingers in her hand. She had trouble initially and buttoning her clothes bending her fingers but now that seems to have improved. She denied any complaint headache, slurred speech, facial weakness, gait or balance problems. She did not have any injury to her neck or shoulders. She is no history of neck pain, radical pain, gait or  balance problems. She has no history of stroke, TIA, seizures, migraines or significant neurological problems. She has chronic hepatitis C diagnosed a few years ago but has not been on treatment but plans to start soon.  ROS:   14 system review of systems is positive for weight loss, cramps, itching, numbness, weakness and falls of systems negative  PMH:  Past Medical History  Diagnosis Date  . Medical history non-contributory   . Hepatitis C virus     Social History:  History   Social History  . Marital Status: Single    Spouse Name: N/A    Number of Children: 1  . Years of Education: 12th   Occupational History  . [employer]    Social History Main Topics  . Smoking status: Former Research scientist (life sciences)  . Smokeless tobacco: Never Used  . Alcohol Use: No  . Drug Use: No  . Sexual Activity: No   Other Topics Concern  . Not on file   Social History  Narrative  . No narrative on file    Medications:   Current Outpatient Prescriptions on File Prior to Visit  Medication Sig Dispense Refill  . albuterol (PROVENTIL HFA;VENTOLIN HFA) 108 (90 BASE) MCG/ACT inhaler Inhale 2 puffs into the lungs every 6 (six) hours as needed for wheezing or shortness of breath (rescue).      . Cholecalciferol (VITAMIN D3) 5000 UNITS CAPS Take 5,000 Units by mouth 2 (two) times a week.      Marland Kitchen ibuprofen (ADVIL,MOTRIN) 200 MG tablet Take 400 mg by mouth every 6 (six) hours as needed for fever, headache or mild pain.       . naproxen sodium (ANAPROX) 220 MG tablet Take 440 mg by mouth daily as needed (for pain).       No current facility-administered medications on file prior to visit.    Allergies:   Allergies  Allergen Reactions  . Hydrocodone Itching  . Penicillins Hives    sweats    Physical Exam General: well developed, well nourished, seated, in no evident distress Head: head normocephalic and atraumatic. Orohparynx benign Neck: supple with no carotid or supraclavicular bruits Cardiovascular: regular rate and rhythm, no murmurs Musculoskeletal: no deformity Skin:  no rash/petichiae Vascular:  Normal pulses all extremities Filed Vitals:   05/15/14 1314  BP: 131/72  Pulse: 78    Neurologic Exam Mental Status: Awake  and fully alert. Oriented to place and time. Recent and remote memory intact. Attention span, concentration and fund of knowledge appropriate. Mood and affect appropriate.  Cranial Nerves: Fundoscopic exam reveals sharp disc margins. Pupils equal, briskly reactive to light. Extraocular movements full without nystagmus. Visual fields full to confrontation. Hearing intact. Facial sensation intact. Face, tongue, palate moves normally and symmetrically.  Motor: Normal bulk and tone. Diminished strength in the left hip as well as intrinsic hand muscles. Equal weakness of wrist flexion and extension. Orbits right over left approximately.    Sensory.: Diminished touch and pin prick sensation in the left upper extremity from the elbow down both medially as well as laterally extending into all 5 fingers. Position and vibration sense are preserved. Coordination: Rapid alternating movements normal in all extremities. Finger-to-nose and heel-to-shin performed accurately bilaterally. Gait and Station: Arises from chair without difficulty. Stance is normal. Gait demonstrates normal stride length and balance . Able to heel, toe and tandem walk without difficulty.  Reflexes: 1+ and symmetric. Toes downgoing.      ASSESSMENT: 52 year African American lady with sudden onset of left upper extremity numbness and weakness 3 weeks ago possibly right brain subcortical stroke. Radial nerve palsy or cervical radiculopathy is less likely given the distribution of numbness is outside radial nerve distribution    PLAN: I had a long discussion with the patient regarding her left hand weakness numbness, discuss results of clinical evaluation, differential diagnosis, planned for further testing and answered questions. I recommend she start taking aspirin 81 mg daily and check MRI scan of the brain and cervical spine as well as EMG nerve conduction study. Refer to occupational therapy to improve left hand functioning. Return for followup in 2 months or call earlier if necessary.   Note: This document was prepared with digital dictation and possible smart phrase technology. Any transcriptional errors that result from this process are unintentional.

## 2014-05-15 NOTE — Patient Instructions (Signed)
I had a long discussion with the patient regarding her left hand weakness numbness, discuss results of clinical evaluation, differential diagnosis, planned for further testing and answered questions. I recommend she start taking aspirin 81 mg daily and check MRI scan of the brain and cervical spine as well as EMG nerve conduction study. Refer to occupational therapy to improve left hand functioning. Return for followup in 2 months or call earlier if necessary.

## 2014-05-16 ENCOUNTER — Other Ambulatory Visit (HOSPITAL_COMMUNITY): Payer: Self-pay | Admitting: Nurse Practitioner

## 2014-05-16 DIAGNOSIS — B182 Chronic viral hepatitis C: Secondary | ICD-10-CM

## 2014-05-28 ENCOUNTER — Other Ambulatory Visit: Payer: BC Managed Care – PPO

## 2014-06-05 ENCOUNTER — Ambulatory Visit (INDEPENDENT_AMBULATORY_CARE_PROVIDER_SITE_OTHER): Payer: BC Managed Care – PPO

## 2014-06-05 DIAGNOSIS — R29898 Other symptoms and signs involving the musculoskeletal system: Secondary | ICD-10-CM

## 2014-06-05 DIAGNOSIS — M6281 Muscle weakness (generalized): Secondary | ICD-10-CM

## 2014-06-06 ENCOUNTER — Ambulatory Visit (INDEPENDENT_AMBULATORY_CARE_PROVIDER_SITE_OTHER): Payer: BC Managed Care – PPO | Admitting: Neurology

## 2014-06-06 ENCOUNTER — Encounter (INDEPENDENT_AMBULATORY_CARE_PROVIDER_SITE_OTHER): Payer: Self-pay

## 2014-06-06 DIAGNOSIS — R200 Anesthesia of skin: Secondary | ICD-10-CM

## 2014-06-06 DIAGNOSIS — R29898 Other symptoms and signs involving the musculoskeletal system: Secondary | ICD-10-CM

## 2014-06-06 DIAGNOSIS — Z0289 Encounter for other administrative examinations: Secondary | ICD-10-CM

## 2014-06-06 DIAGNOSIS — M6281 Muscle weakness (generalized): Secondary | ICD-10-CM

## 2014-06-06 DIAGNOSIS — R202 Paresthesia of skin: Secondary | ICD-10-CM

## 2014-06-06 MED ORDER — GADOPENTETATE DIMEGLUMINE 469.01 MG/ML IV SOLN: 10.0000 mL | Freq: Once | INTRAVENOUS | Status: AC | PRN

## 2014-06-06 NOTE — Procedures (Signed)
   NCS (NERVE CONDUCTION STUDY) WITH EMG (ELECTROMYOGRAPHY) REPORT   STUDY DATE: August 28th 2015 PATIENT NAME: Taylor Santiago DOB: 04-12-1956 MRN: [id number]    TECHNOLOGIST: Laretta Alstrom ELECTROMYOGRAPHER: Marcial Pacas M.D.  CLINICAL INFORMATION:  58 years old right-handed female, woke up with left hand arm numbness.  On examination, bilateral upper extremity motor strength is normal, including bilateral abductor pollicis brevis, and opponents. Decreased light touch at the first 3 fingerpads.  FINDINGS: NERVE CONDUCTION STUDY: Bilateral ulnar sensory and motor responses were normal. Bilateral radial sensory and motor responses were normal. Bilateral median sensory response showed moderately prolonged peak latency, with well preserved snap amplitude.  Bilateral median motor responses showed moderately prolonged distal latency, half way latencies, with well-preserved C. map amplitude, conduction velocity.  NEEDLE ELECTROMYOGRAPHY: Selected needle examination was performed at left upper extremity, left cervical paraspinal muscles.  Left abductor pollicis brevis, normally insertion activity, no  spontaneous activity, normal morphology motor unit potential, with mildly increased recruitment patterns.   Needle examination of left pronator teres, brachioradialis, biceps triceps deltoid was normal.  There was no spontaneous activity at left cervical paraspinal muscles, left C5-6 and 7  IMPRESSION:  This is an abnormal study. There is electrodiagnostic evidence of bilateral median neuropathy across the wrist, consistent with moderate bilateral carpal tunnel syndromes. There was no evidence of left cervical radiculopathy.   INTERPRETING PHYSICIAN:   Marcial Pacas M.D. Ph.D. [facility] [address] [place], [place] [address] [phone]

## 2014-06-09 ENCOUNTER — Telehealth: Payer: Self-pay | Admitting: Neurology

## 2014-07-22 ENCOUNTER — Other Ambulatory Visit (HOSPITAL_COMMUNITY): Payer: Self-pay | Admitting: Internal Medicine

## 2014-07-22 DIAGNOSIS — Z1231 Encounter for screening mammogram for malignant neoplasm of breast: Secondary | ICD-10-CM

## 2014-07-29 ENCOUNTER — Ambulatory Visit (HOSPITAL_COMMUNITY)
Admission: RE | Admit: 2014-07-29 | Discharge: 2014-07-29 | Disposition: A | Discharge status: Home / Self Care | Payer: BC Managed Care – PPO | Source: Ambulatory Visit | Attending: Internal Medicine | Admitting: Internal Medicine

## 2014-07-29 DIAGNOSIS — Z1231 Encounter for screening mammogram for malignant neoplasm of breast: Secondary | ICD-10-CM | POA: Insufficient documentation

## 2014-08-11 ENCOUNTER — Encounter: Payer: Self-pay | Admitting: Neurology

## 2014-08-20 ENCOUNTER — Ambulatory Visit: Payer: BC Managed Care – PPO | Admitting: Neurology

## 2014-10-12 ENCOUNTER — Encounter (HOSPITAL_COMMUNITY): Payer: Self-pay | Admitting: Emergency Medicine

## 2014-10-12 ENCOUNTER — Emergency Department (HOSPITAL_COMMUNITY)
Admission: EM | Admit: 2014-10-12 | Discharge: 2014-10-12 | Discharge status: Against Medical Advice | Payer: BC Managed Care – PPO | Attending: Emergency Medicine | Admitting: Emergency Medicine

## 2014-10-12 DIAGNOSIS — H9313 Tinnitus, bilateral: Secondary | ICD-10-CM | POA: Insufficient documentation

## 2014-10-12 DIAGNOSIS — Z87891 Personal history of nicotine dependence: Secondary | ICD-10-CM | POA: Insufficient documentation

## 2014-10-12 DIAGNOSIS — Z8619 Personal history of other infectious and parasitic diseases: Secondary | ICD-10-CM | POA: Insufficient documentation

## 2014-10-12 DIAGNOSIS — R52 Pain, unspecified: Secondary | ICD-10-CM | POA: Insufficient documentation

## 2014-10-12 DIAGNOSIS — Z7982 Long term (current) use of aspirin: Secondary | ICD-10-CM | POA: Insufficient documentation

## 2014-10-12 DIAGNOSIS — R0981 Nasal congestion: Secondary | ICD-10-CM | POA: Insufficient documentation

## 2014-10-12 DIAGNOSIS — Z880 Allergy status to penicillin: Secondary | ICD-10-CM | POA: Insufficient documentation

## 2014-10-12 DIAGNOSIS — M791 Myalgia: Secondary | ICD-10-CM | POA: Insufficient documentation

## 2014-10-12 DIAGNOSIS — Z79899 Other long term (current) drug therapy: Secondary | ICD-10-CM | POA: Insufficient documentation

## 2014-10-12 DIAGNOSIS — R05 Cough: Secondary | ICD-10-CM | POA: Insufficient documentation

## 2014-10-12 NOTE — ED Notes (Addendum)
Patient left prior to being discharge without warning. Unable to obtain signature for AMA.

## 2014-10-12 NOTE — ED Notes (Signed)
Pt c/o body aches, cough with yellow mucous and ears ringing for past couple of days. Pt unsure about fever. Pt states that she has had chills.

## 2014-10-12 NOTE — ED Provider Notes (Signed)
CSN: [id number]     Arrival date & time 10/12/14  0907 History   First MD Initiated Contact with Patient 10/12/14 [phone]     Chief Complaint  Patient presents with  . Generalized Body Aches  . Otalgia  . Cough     (Consider location/radiation/quality/duration/timing/severity/associated sxs/prior Treatment) HPI Taylor Santiago is a 59 y.o. female comes in for generalized body aches. Patient states for the past day and a half she has had generalized body aches with associated congestion, right-sided headache and ringing in her ears. She reports taking TheraFlu, Alka-Seltzer and NyQuil yesterday and reports her symptoms all improved. She does not have any headache today, no ringing in her ears her body aches have improved. However, she did not want to continue taking this medication today because she wanted to be evaluated to see if she needed an antibiotic. She denies any other symptoms at this time. She did receive flu shot in November. No other modifying factors  Past Medical History  Diagnosis Date  . Medical history non-contributory   . Hepatitis C virus    Past Surgical History  Procedure Laterality Date  . Abdominal hysterectomy    . Appendectomy     Family History  Problem Relation Age of Onset  . Hypertension Mother   . Diabetes Mother   . Heart disease Maternal Grandmother   . Heart disease Maternal Grandfather    History  Substance Use Topics  . Smoking status: Former Research scientist (life sciences)  . Smokeless tobacco: Never Used  . Alcohol Use: No   OB History    Gravida Para Term Preterm AB TAB SAB Ectopic Multiple Living   1 1        1      Review of Systems  Constitutional: Negative for fever.  HENT: Positive for congestion and tinnitus. Negative for sore throat.   Eyes: Negative for visual disturbance.  Respiratory: Positive for cough. Negative for shortness of breath.   Cardiovascular: Negative for chest pain.  Gastrointestinal: Negative for nausea, vomiting and abdominal pain.   Endocrine: Negative for polyuria.  Genitourinary: Negative for dysuria.  Musculoskeletal: Positive for myalgias. Negative for neck pain and neck stiffness.  Skin: Negative for color change and rash.  Neurological: Positive for headaches.      Allergies  Hydrocodone and Penicillins  Home Medications   Prior to Admission medications   Medication Sig Start Date End Date Taking? Authorizing Provider  albuterol (PROVENTIL HFA;VENTOLIN HFA) 108 (90 BASE) MCG/ACT inhaler Inhale 2 puffs into the lungs every 6 (six) hours as needed for wheezing or shortness of breath (rescue).   Yes Historical Provider, MD  DM-Phenylephrine-Acetaminophen [facility] SEVERE COLD PO) Take 1 each by mouth once.   Yes Historical Provider, MD  naproxen sodium (ANAPROX) 220 MG tablet Take 440 mg by mouth daily as needed (for pain).   Yes Historical Provider, MD  Phenyleph-CPM-DM-Aspirin (ALKA-SELTZER PLUS COLD & COUGH PO) Take 1 each by mouth once.   Yes Historical Provider, MD  Pseudoeph-Doxylamine-DM-APAP (NYQUIL PO) Take 2 capsules by mouth once.   Yes Historical Provider, MD  aspirin (ASPIRIN CHILDRENS) 81 MG chewable tablet Chew 1 tablet (81 mg total) by mouth daily. Patient not taking: Reported on 10/12/2014 05/15/14 05/15/15  Antony Contras, MD   BP 124/83 mmHg  Pulse 86  Temp(Src) 98.1 F (36.7 C) (Oral)  Resp 18  SpO2 100% Physical Exam  Constitutional: She is oriented to person, place, and time. She appears well-developed and well-nourished. No distress.  HENT:  Head:  Normocephalic and atraumatic.  Mouth/Throat: Oropharynx is clear and moist. No oropharyngeal exudate.  Eyes: Conjunctivae are normal. Pupils are equal, round, and reactive to light. Right eye exhibits no discharge. Left eye exhibits no discharge. No scleral icterus.  Neck: Neck supple.  Cardiovascular: Normal rate, regular rhythm and normal heart sounds.   Pulmonary/Chest: Effort normal and breath sounds normal. No respiratory distress. She has  no wheezes. She has no rales.  Abdominal: Soft. She exhibits no distension. There is no tenderness.  Musculoskeletal: She exhibits no tenderness.  Neurological: She is alert and oriented to person, place, and time.  Cranial Nerves II-XII grossly intact  Skin: Skin is warm and dry. No rash noted. She is not diaphoretic.  Psychiatric: She has a normal mood and affect.  Nursing note and vitals reviewed.   ED Course  Procedures (including critical care time) Labs Review Labs Reviewed - No data to display  Imaging Review No results found.   EKG Interpretation None     Meds given in ED:  Medications - No data to display  Discharge Medication List as of 10/12/2014 10:40 AM     Filed Vitals:   10/12/14 0923  BP: 124/83  Pulse: 86  Temp: 98.1 F (36.7 C)  TempSrc: Oral  Resp: 18  SpO2: 100%    MDM  Taylor Santiago is a 59 y.o. female who comes in for generalized body aches, congestion for the past 1 day. She has tried OTC Sinus and Cold medications that provided relief of her symptoms. She reports most of her symptoms have resolved at this time, including HA and tinnitus.Denies fevers, N/V/D/C, abd pain, cp , sob.  Vitals stable - WNL -afebrile Pt resting comfortably in ED. PE--Benign Lung, Abdominal exams. Not concerning for other acute or emergent pathology.  Pt likely experiencing viral illness that is already improving. Encouraged symptomatic support and continued use of OTC medications Discussed f/u with PCP and return precautions, pt very amenable to plan.  Pt eloped prior to discharge.  Final diagnoses:  Body aches        [place], PA-C 10/13/14 [place], MD 10/14/14 [phone]

## 2014-12-29 ENCOUNTER — Emergency Department (HOSPITAL_COMMUNITY)
Admission: EM | Admit: 2014-12-29 | Discharge: 2014-12-29 | Disposition: A | Discharge status: Home / Self Care | Payer: Self-pay | Attending: Emergency Medicine | Admitting: Emergency Medicine

## 2014-12-29 ENCOUNTER — Emergency Department (HOSPITAL_COMMUNITY): Payer: Self-pay

## 2014-12-29 ENCOUNTER — Encounter (HOSPITAL_COMMUNITY): Payer: Self-pay | Admitting: Emergency Medicine

## 2014-12-29 DIAGNOSIS — Z79899 Other long term (current) drug therapy: Secondary | ICD-10-CM | POA: Insufficient documentation

## 2014-12-29 DIAGNOSIS — R058 Other specified cough: Secondary | ICD-10-CM

## 2014-12-29 DIAGNOSIS — R05 Cough: Secondary | ICD-10-CM

## 2014-12-29 DIAGNOSIS — R52 Pain, unspecified: Secondary | ICD-10-CM

## 2014-12-29 DIAGNOSIS — Z8619 Personal history of other infectious and parasitic diseases: Secondary | ICD-10-CM | POA: Insufficient documentation

## 2014-12-29 DIAGNOSIS — Z880 Allergy status to penicillin: Secondary | ICD-10-CM | POA: Insufficient documentation

## 2014-12-29 DIAGNOSIS — Z7982 Long term (current) use of aspirin: Secondary | ICD-10-CM | POA: Insufficient documentation

## 2014-12-29 DIAGNOSIS — R0981 Nasal congestion: Secondary | ICD-10-CM

## 2014-12-29 DIAGNOSIS — J069 Acute upper respiratory infection, unspecified: Secondary | ICD-10-CM | POA: Insufficient documentation

## 2014-12-29 DIAGNOSIS — J40 Bronchitis, not specified as acute or chronic: Secondary | ICD-10-CM | POA: Insufficient documentation

## 2014-12-29 MED ORDER — FLUTICASONE PROPIONATE 50 MCG/ACT NA SUSP: 2.0000 | Freq: Every day | NASAL | Status: DC

## 2014-12-29 MED ORDER — NAPROXEN 500 MG PO TABS
500.0000 mg | ORAL_TABLET | Freq: Once | ORAL | Status: AC
  Administered 2014-12-29: 500 mg via ORAL
  Filled 2014-12-29: qty 1

## 2014-12-29 MED ORDER — NAPROXEN 500 MG PO TABS
500.0000 mg | ORAL_TABLET | Freq: Two times a day (BID) | ORAL | Status: DC | PRN
Start: 2014-12-29 — End: 2015-12-31

## 2014-12-29 NOTE — ED Notes (Signed)
Patient transported to X-ray 

## 2014-12-29 NOTE — Discharge Instructions (Signed)
Continue to stay well-hydrated. Gargle warm salt water and spit it out. Continue to alternate between Tylenol and naprosyn for pain or fever. Use Mucinex for cough suppression/expectoration of mucus. Use netipot and flonase to help with nasal congestion. May consider over-the-counter Benadryl or other antihistamine to decrease secretions and for watery itchy eyes. Followup with your primary care doctor in 5-7 days for recheck of ongoing symptoms. Return to emergency department for emergent changing or worsening of symptoms.   Cough, Adult  A cough is a reflex. It helps you clear your throat and airways. A cough can help heal your body. A cough can last 2 or 3 weeks (acute) or may last more than 8 weeks (chronic). Some common causes of a cough can include an infection, allergy, or a cold. HOME CARE  Only take medicine as told by your doctor.  If given, take your medicines (antibiotics) as told. Finish them even if you start to feel better.  Use a cold steam vaporizer or humidifier in your home. This can help loosen thick spit (secretions).  Sleep so you are almost sitting up (semi-upright). Use pillows to do this. This helps reduce coughing.  Rest as needed.  Stop smoking if you smoke. GET HELP RIGHT AWAY IF:  You have yellowish-white fluid (pus) in your thick spit.  Your cough gets worse.  Your medicine does not reduce coughing, and you are losing sleep.  You cough up blood.  You have trouble breathing.  Your pain gets worse and medicine does not help.  You have a fever. MAKE SURE YOU:   Understand these instructions.  Will watch your condition.  Will get help right away if you are not doing well or get worse. Document Released: 06/09/2011 Document Revised: 02/10/2014 Document Reviewed: 06/09/2011 [facility] Patient Information 2015 [place], [place]. This information is not intended to replace advice given to you by your health care provider. Make sure you discuss any questions  you have with your health care provider.  Upper Respiratory Infection, Adult An upper respiratory infection (URI) is also known as the common cold. It is often caused by a type of germ (virus). Colds are easily spread (contagious). You can pass it to others by kissing, coughing, sneezing, or drinking out of the same glass. Usually, you get better in 1 or 2 weeks.  HOME CARE   Only take medicine as told by your doctor.  Use a warm mist humidifier or breathe in steam from a hot shower.  Drink enough water and fluids to keep your pee (urine) clear or pale yellow.  Get plenty of rest.  Return to work when your temperature is back to normal or as told by your doctor. You may use a face mask and wash your hands to stop your cold from spreading. GET HELP RIGHT AWAY IF:   After the first few days, you feel you are getting worse.  You have questions about your medicine.  You have chills, shortness of breath, or brown or red spit (mucus).  You have yellow or brown snot (nasal discharge) or pain in the face, especially when you bend forward.  You have a fever, puffy (swollen) neck, pain when you swallow, or white spots in the back of your throat.  You have a bad headache, ear pain, sinus pain, or chest pain.  You have a high-pitched whistling sound when you breathe in and out (wheezing).  You have a lasting cough or cough up blood.  You have sore muscles or a  stiff neck. MAKE SURE YOU:   Understand these instructions.  Will watch your condition.  Will get help right away if you are not doing well or get worse. Document Released: 03/14/2008 Document Revised: 12/19/2011 Document Reviewed: 01/01/2014 [facility] Patient Information 2015 [place], [place]. This information is not intended to replace advice given to you by your health care provider. Make sure you discuss any questions you have with your health care provider.

## 2014-12-29 NOTE — ED Notes (Signed)
Pt states that she has had a cough and body aches x 2 days. Low grade temp. Alert and oriented.

## 2014-12-29 NOTE — ED Provider Notes (Signed)
CSN: [id number]     Arrival date & time 12/29/14  1630 History  This chart was scribed for non-physician practitioner, Zacarias Pontes, PA-C, working with Quintella Reichert, MD, by Jeanell Sparrow, ED Scribe. This patient was seen in room WTR5/WTR5 and the patient's care was started at 6:38 PM.    Chief Complaint  Patient presents with  . Generalized Body Aches  . Cough   Patient is a 59 y.o. female presenting with cough. The history is provided by the patient. No language interpreter was used.  Cough Cough characteristics:  Productive Sputum characteristics:  Bloody Severity:  Moderate Onset quality:  Gradual Duration:  3 days Timing:  Intermittent Progression:  Unchanged Chronicity:  New Smoker: no   Context: upper respiratory infection   Relieved by:  Nothing Worsened by:  Nothing tried Ineffective treatments: OTC medication. Associated symptoms: chest pain (only with cough), chills, fever (subjective feeling of warmth), headaches (mild intermittent), myalgias (generalized), rhinorrhea (clear), sinus congestion and sore throat   Associated symptoms: no ear fullness, no ear pain, no rash, no shortness of breath and no wheezing   Risk factors: no recent travel     HPI Comments: Taylor Santiago is a 59 y.o. female with a PMHx of hepatitis C, who presents to the Emergency Department complaining of intermittent moderate cough that started 2 days ago. She states that she has been sick the past 2 days. She describes the cough as a a productive cough with yellowish phlegm and intermittent streaks of bright red blood, without frank hemoptysis. She reports associated subjective fever (feels warm), chills, intermittent mild headache/sinus congestion pain, body aches, clear rhinorrhea, sore throat, and chest pain only with coughing. Pt's temperature in the ED is 98.1. She states that she took OTC medication without any relief. She denies any hx of asthma or smoking. She also denies any SOB,  wheezing, trismus, drooling, ear pain/drainage, abdominal pain, nausea, vomiting, diarrhea, hematuria, dysuria, weakness, light headedness, or blurry vision. Denies known sick contacts.    Past Medical History  Diagnosis Date  . Medical history non-contributory   . Hepatitis C virus    Past Surgical History  Procedure Laterality Date  . Abdominal hysterectomy    . Appendectomy     Family History  Problem Relation Age of Onset  . Hypertension Mother   . Diabetes Mother   . Heart disease Maternal Grandmother   . Heart disease Maternal Grandfather    History  Substance Use Topics  . Smoking status: Former Research scientist (life sciences)  . Smokeless tobacco: Never Used  . Alcohol Use: No   OB History    Gravida Para Term Preterm AB TAB SAB Ectopic Multiple Living   1 1        1      Review of Systems  Constitutional: Positive for fever (subjective feeling of warmth) and chills.  HENT: Positive for rhinorrhea (clear), sinus pressure and sore throat. Negative for drooling, ear discharge, ear pain and trouble swallowing.   Eyes: Negative for photophobia and visual disturbance.  Respiratory: Positive for cough. Negative for shortness of breath and wheezing.   Cardiovascular: Positive for chest pain (only with cough).  Gastrointestinal: Negative for nausea, vomiting, abdominal pain and diarrhea.  Genitourinary: Negative for dysuria and hematuria.  Musculoskeletal: Positive for myalgias (generalized). Negative for arthralgias.  Skin: Negative for rash.  Allergic/Immunologic: Negative for immunocompromised state.  Neurological: Positive for headaches (mild intermittent). Negative for weakness, light-headedness and numbness.  Psychiatric/Behavioral: Negative for confusion.  10 Systems reviewed and  all are negative for acute change except as noted in the HPI.  Allergies  Hydrocodone and Penicillins  Home Medications   Prior to Admission medications   Medication Sig Start Date End Date Taking?  Authorizing Provider  albuterol (PROVENTIL HFA;VENTOLIN HFA) 108 (90 BASE) MCG/ACT inhaler Inhale 2 puffs into the lungs every 6 (six) hours as needed for wheezing or shortness of breath (rescue).    Historical Provider, MD  aspirin (ASPIRIN CHILDRENS) 81 MG chewable tablet Chew 1 tablet (81 mg total) by mouth daily. Patient not taking: Reported on 10/12/2014 05/15/14 05/15/15  Garvin Fila, MD  DM-Phenylephrine-Acetaminophen [facility] SEVERE COLD PO) Take 1 each by mouth once.    Historical Provider, MD  naproxen sodium (ANAPROX) 220 MG tablet Take 440 mg by mouth daily as needed (for pain).    Historical Provider, MD  Phenyleph-CPM-DM-Aspirin (ALKA-SELTZER PLUS COLD & COUGH PO) Take 1 each by mouth once.    Historical Provider, MD  Pseudoeph-Doxylamine-DM-APAP (NYQUIL PO) Take 2 capsules by mouth once.    Historical Provider, MD   BP 127/72 mmHg  Pulse 85  Temp(Src) 98.1 F (36.7 C) (Oral)  SpO2 100% Physical Exam  Constitutional: She is oriented to person, place, and time. Vital signs are normal. She appears well-developed and well-nourished.  Non-toxic appearance. No distress.  Afebrile, nontoxic, NAD  HENT:  Head: Normocephalic and atraumatic.  Right Ear: Hearing, tympanic membrane, external ear and ear canal normal.  Left Ear: Hearing, tympanic membrane, external ear and ear canal normal.  Nose: Mucosal edema and rhinorrhea present. Right sinus exhibits maxillary sinus tenderness and frontal sinus tenderness. Left sinus exhibits maxillary sinus tenderness and frontal sinus tenderness.  Mouth/Throat: Uvula is midline, oropharynx is clear and moist and mucous membranes are normal. No trismus in the jaw. No uvula swelling.  Ears clear bilaterally. Nose with mild nasal mucosa edema and clear rhinorrhea. Orophranyx clear and moist without tonillar swelling or exudates. All sinuses TTP.   Eyes: Conjunctivae and EOM are normal. Right eye exhibits no discharge. Left eye exhibits no discharge.   Neck: Normal range of motion. Neck supple. No spinous process tenderness and no muscular tenderness present. No rigidity. Normal range of motion present.  No meningismus.   Cardiovascular: Normal rate, regular rhythm, normal heart sounds and intact distal pulses.  Exam reveals no gallop and no friction rub.   No murmur heard. RRR, nl s1/s2, no m/r/g, distal pulses intact, no pedal edema   Pulmonary/Chest: Effort normal and breath sounds normal. No respiratory distress. She has no decreased breath sounds. She has no wheezes. She has no rhonchi. She has no rales. She exhibits tenderness. She exhibits no crepitus, no deformity and no retraction.  CTAB in all lung fields, no w/r/r, no hypoxia or increased WOB, speaking in full sentences, SpO2 100% on RA  Mild chest wall TTP anteriorly without crepitus, retraction or deformity.   Abdominal: Soft. Normal appearance and bowel sounds are normal. She exhibits no distension. There is no tenderness. There is no rigidity, no rebound and no guarding.  Musculoskeletal: Normal range of motion.  Lymphadenopathy:    She has cervical adenopathy.  Shotty anterior LAD bilaterally.   Neurological: She is alert and oriented to person, place, and time. She has normal strength. No sensory deficit.  Skin: Skin is warm, dry and intact. No rash noted.  Psychiatric: She has a normal mood and affect.  Nursing note and vitals reviewed.   ED Course  Procedures (including critical care time) DIAGNOSTIC STUDIES:  Oxygen Saturation is 100% on RA, normal by my interpretation.    COORDINATION OF CARE: 6:43 PM- Pt advised of plan for treatment and pt agrees.  Labs Review Labs Reviewed - No data to display  Imaging Review Dg Chest 2 View  12/29/2014   CLINICAL DATA:  Fever, cough, congestion for 2 days  EXAM: CHEST  2 VIEW  COMPARISON:  April 25, 2014  FINDINGS: The heart size and mediastinal contours are within normal limits. The heart size is upper limits are normal.  There is no focal infiltrate, pulmonary edema, or pleural effusion. The visualized skeletal structures are stable.  IMPRESSION: No active cardiopulmonary disease.   Electronically Signed   By: Abelardo Diesel M.D.   On: 12/29/2014 19:40     EKG Interpretation None      MDM   Final diagnoses:  Cough productive of purulent sputum  Nasal congestion  Body aches  Bronchitis  URI (upper respiratory infection)    59 y.o. female here with productive cough, unknown fevers but with chills. Pt is afebrile with a clear lung exam but given productive cough will obtain CXR. Mild rhinorrhea with nasal mucosa congestion. Likely viral URI but will get CXR to r/o PNA. Doubt need for inhalers or prednisone. Will give naprosyn here for pain and then reassess.  8:12 PM CXR clear. Pt is agreeable to symptomatic treatment with close follow up with PCP as needed but spoke at length about emergent changing or worsening of symptoms that should prompt return to ER. Pt voices understanding and is agreeable to plan. Stable at time of discharge.  I personally performed the services described in this documentation, which was scribed in my presence. The recorded information has been reviewed and is accurate.  BP 127/72 mmHg  Pulse 85  Temp(Src) 98.1 F (36.7 C) (Oral)  SpO2 100%  Meds ordered this encounter  Medications  . naproxen (NAPROSYN) tablet 500 mg    Sig:   . naproxen (NAPROSYN) 500 MG tablet    Sig: Take 1 tablet (500 mg total) by mouth 2 (two) times daily as needed for mild pain, moderate pain or headache (TAKE WITH MEALS.).    Dispense:  20 tablet    Refill:  0    Order Specific Question:  Supervising Provider    Answer:  MILLER, BRIAN [[id number]]  . fluticasone (FLONASE) 50 MCG/ACT nasal spray    Sig: Place 2 sprays into both nostrils daily.    Dispense:  16 g    Refill:  0    Order Specific Question:  Supervising Provider    Answer:  Noemi Chapel [[id number]]       Louie Flenner Camprubi-Soms,  PA-C 12/29/14 2016  Quintella Reichert, MD 12/29/14 2317

## 2015-08-19 ENCOUNTER — Emergency Department (HOSPITAL_COMMUNITY)
Admission: EM | Admit: 2015-08-19 | Discharge: 2015-08-19 | Disposition: A | Discharge status: Home / Self Care | Payer: 59 | Attending: Physician Assistant | Admitting: Physician Assistant

## 2015-08-19 ENCOUNTER — Encounter (HOSPITAL_COMMUNITY): Payer: Self-pay

## 2015-08-19 ENCOUNTER — Emergency Department (HOSPITAL_COMMUNITY): Payer: 59

## 2015-08-19 DIAGNOSIS — Z87891 Personal history of nicotine dependence: Secondary | ICD-10-CM | POA: Insufficient documentation

## 2015-08-19 DIAGNOSIS — Z7951 Long term (current) use of inhaled steroids: Secondary | ICD-10-CM | POA: Insufficient documentation

## 2015-08-19 DIAGNOSIS — J069 Acute upper respiratory infection, unspecified: Secondary | ICD-10-CM | POA: Insufficient documentation

## 2015-08-19 DIAGNOSIS — Z880 Allergy status to penicillin: Secondary | ICD-10-CM | POA: Diagnosis not present

## 2015-08-19 DIAGNOSIS — Z8619 Personal history of other infectious and parasitic diseases: Secondary | ICD-10-CM | POA: Diagnosis not present

## 2015-08-19 DIAGNOSIS — Z79899 Other long term (current) drug therapy: Secondary | ICD-10-CM | POA: Insufficient documentation

## 2015-08-19 DIAGNOSIS — R05 Cough: Secondary | ICD-10-CM | POA: Diagnosis present

## 2015-08-19 LAB — I-STAT CHEM 8, ED
BUN: 9 mg/dL (ref 6–20)
Calcium, Ion: 1.24 mmol/L — ABNORMAL HIGH (ref 1.12–1.23)
Chloride: 98 mmol/L — ABNORMAL LOW (ref 101–111)
Creatinine, Ser: 0.8 mg/dL (ref 0.44–1.00)
Glucose, Bld: 110 mg/dL — ABNORMAL HIGH (ref 65–99)
HEMATOCRIT: 43 % (ref 36.0–46.0)
HEMOGLOBIN: 14.6 g/dL (ref 12.0–15.0)
Potassium: 3.9 mmol/L (ref 3.5–5.1)
Sodium: 139 mmol/L (ref 135–145)
TCO2: 26 mmol/L (ref 0–100)

## 2015-08-19 LAB — RAPID STREP SCREEN (MED CTR MEBANE ONLY): Streptococcus, Group A Screen (Direct): NEGATIVE

## 2015-08-19 MED ORDER — ACETAMINOPHEN 325 MG PO TABS
650.0000 mg | ORAL_TABLET | Freq: Once | ORAL | Status: AC
  Administered 2015-08-19: 650 mg via ORAL
  Filled 2015-08-19: qty 2

## 2015-08-19 MED ORDER — BENZONATATE 100 MG PO CAPS
100.0000 mg | ORAL_CAPSULE | Freq: Three times a day (TID) | ORAL | Status: DC | PRN
Start: 2015-08-19 — End: 2015-12-31

## 2015-08-19 MED ORDER — BENZONATATE 100 MG PO CAPS
100.0000 mg | ORAL_CAPSULE | Freq: Once | ORAL | Status: AC
  Administered 2015-08-19: 100 mg via ORAL
  Filled 2015-08-19: qty 1

## 2015-08-19 MED ORDER — ACETAMINOPHEN 500 MG PO TABS: 500.0000 mg | ORAL_TABLET | Freq: Four times a day (QID) | ORAL | Status: DC | PRN

## 2015-08-19 NOTE — ED Notes (Signed)
Pt. Reports having sore throat , cough congestion, body aches chills since Sunday .  Pt. Got the flu shot 1 month ago .  She has a hx of URI>

## 2015-08-19 NOTE — ED Notes (Signed)
Patient being transported to xray at this time 

## 2015-08-19 NOTE — ED Notes (Signed)
Patient has returned from being out of the department for testing

## 2015-08-19 NOTE — ED Provider Notes (Signed)
CSN: [id number]     Arrival date & time 08/19/15  1118 History  By signing my name below, I, Eustaquio Maize, attest that this documentation has been prepared under the direction and in the presence of Gloriann Loan, PA-C. Electronically Signed: Eustaquio Maize, ED Scribe. 08/19/2015. 12:24 PM.  Chief Complaint  Patient presents with  . Cough   The history is provided by the patient. No language interpreter was used.     HPI Comments: Taylor Santiago is a 59 y.o. female who presents to the Emergency Department complaining of gradual onset, constant, sore throat, rhinorrhea, productive cough with phlegm, and myalgias x 3 days, gradually worsening. She notes that she began coughing up flecks of bright red blood in her sputum today. Pt also complains of chest tightness with coughing. She has taken Alka seltzer, NyQuil Cold & Flu, and drank hot tea without relief. Pt has been staying hydrated and eating normally. She has hx of URI and states her symptoms feel similar. Denies fever, ear pain, neck stiffness, leg swelling, or any other associated symptoms. Non smoker. Pt had a flu shot approximately 1 month ago. No hx DVT/PE.   Past Medical History  Diagnosis Date  . Medical history non-contributory   . Hepatitis C virus    Past Surgical History  Procedure Laterality Date  . Abdominal hysterectomy    . Appendectomy     Family History  Problem Relation Age of Onset  . Hypertension Mother   . Diabetes Mother   . Heart disease Maternal Grandmother   . Heart disease Maternal Grandfather    Social History  Substance Use Topics  . Smoking status: Former Research scientist (life sciences)  . Smokeless tobacco: Never Used  . Alcohol Use: No   OB History    Gravida Para Term Preterm AB TAB SAB Ectopic Multiple Living   1 1        1      Review of Systems  All other systems reviewed and are negative.   Allergies  Hydrocodone and Penicillins  Home Medications   Prior to Admission medications   Medication Sig Start  Date End Date Taking? Authorizing Provider  acetaminophen (TYLENOL) 500 MG tablet Take 1 tablet (500 mg total) by mouth every 6 (six) hours as needed. 08/19/15   Gloriann Loan, PA-C  albuterol (PROVENTIL HFA;VENTOLIN HFA) 108 (90 BASE) MCG/ACT inhaler Inhale 2 puffs into the lungs every 6 (six) hours as needed for wheezing or shortness of breath (rescue).    Historical Provider, MD  benzonatate (TESSALON) 100 MG capsule Take 1 capsule (100 mg total) by mouth 3 (three) times daily as needed for cough. 08/19/15   Avianah Pellman, PA-C  DM-Phenylephrine-Acetaminophen (THERAFLU SEVERE COLD PO) Take 30 mLs by mouth daily as needed (flu symptoms).     Historical Provider, MD  fluticasone (FLONASE) 50 MCG/ACT nasal spray Place 2 sprays into both nostrils daily. 12/29/14   Mercedes Camprubi-Soms, PA-C  naproxen (NAPROSYN) 500 MG tablet Take 1 tablet (500 mg total) by mouth 2 (two) times daily as needed for mild pain, moderate pain or headache (TAKE WITH MEALS.). 12/29/14   Mercedes Camprubi-Soms, PA-C  naproxen sodium (ANAPROX) 220 MG tablet Take 220 mg by mouth daily as needed (for pain).     Historical Provider, MD  Phenyleph-CPM-DM-Aspirin (ALKA-SELTZER PLUS COLD & COUGH PO) Take 1 tablet by mouth daily as needed (flu symptoms).     Historical Provider, MD  Pseudoeph-Doxylamine-DM-APAP (NYQUIL PO) Take 2 capsules by mouth daily as needed (flu symptoms).  Historical Provider, MD   Triage Vitals: BP 130/78 mmHg  Pulse 62  Temp(Src) 98.1 F (36.7 C) (Oral)  Resp 20  SpO2 97%   Physical Exam  Constitutional: She is oriented to person, place, and time. She appears well-developed and well-nourished. No distress.  HENT:  Head: Normocephalic and atraumatic.  Right Ear: Tympanic membrane normal.  Left Ear: Tympanic membrane normal.  Nose: Nose normal.  Mouth/Throat: Uvula is midline, oropharynx is clear and moist and mucous membranes are normal. No oropharyngeal exudate, posterior oropharyngeal edema, posterior  oropharyngeal erythema or tonsillar abscesses.  Eyes: Conjunctivae and EOM are normal.  Neck: Neck supple. No tracheal deviation present.  No nuchal rigidity.  Cardiovascular: Normal rate, regular rhythm and normal heart sounds.   Pulmonary/Chest: Effort normal and breath sounds normal. No respiratory distress. She has no wheezes. She has no rales.  Abdominal: Soft. Bowel sounds are normal.  Musculoskeletal: Normal range of motion.  Neurological: She is alert and oriented to person, place, and time.  Skin: Skin is warm and dry.  Psychiatric: She has a normal mood and affect. Her behavior is normal.  Nursing note and vitals reviewed.   ED Course  Procedures (including critical care time)  DIAGNOSTIC STUDIES: Oxygen Saturation is 97% on RA, normal by my interpretation.    COORDINATION OF CARE: 12:23 PM-Discussed treatment plan which includes Tylenol, CXR, rapid strep test, and Chem 8 with pt at bedside and pt agreed to plan.   Labs Review Labs Reviewed  I-STAT CHEM 8, ED - Abnormal; Notable for the following:    Chloride 98 (*)    Glucose, Bld 110 (*)    Calcium, Ion 1.24 (*)    All other components within normal limits  RAPID STREP SCREEN (NOT AT [facility])  CULTURE, GROUP A STREP    Imaging Review Dg Chest 2 View  08/19/2015  CLINICAL DATA:  Cough EXAM: CHEST  2 VIEW COMPARISON:  12/29/2014 FINDINGS: The heart size and mediastinal contours are within normal limits. Both lungs are clear. The visualized skeletal structures are unremarkable. IMPRESSION: No active cardiopulmonary disease. Electronically Signed   By: Franchot Gallo M.D.   On: 08/19/2015 13:12   I have personally reviewed and evaluated these images and lab results as part of my medical decision-making.   EKG Interpretation None      MDM   Final diagnoses:  URI (upper respiratory infection)   Patient presents with cough and sore throat.  VSS, NAD.  HEENT unremarkable.  Lungs CTAB bilaterally.  Heart RRR,abdomen  soft and benign.  Will obtain CXR and chem 8.  Will give tylenol, tessalon perle. Doubt PE.  Doubt PNA.  Doubt meningitis. Suspect URI.  Hgb stable 14.6.  CXR negative.  Evaluation does not show pathology requring ongoing emergent intervention or admission. Pt is hemodynamically stable and mentating appropriately. Discussed findings/results and plan with patient/guardian, who agrees with plan. All questions answered. Return precautions discussed and outpatient follow up given.    I personally performed the services described in this documentation, which was scribed in my presence. The recorded information has been reviewed and is accurate.      Gloriann Loan, PA-C 08/19/15 Ward, MD 08/19/15 1645

## 2015-08-19 NOTE — ED Notes (Signed)
Pt c/o sore throat, cough and bodyaches x 4 days. States has had blood in sputum.

## 2015-08-19 NOTE — Discharge Instructions (Signed)
Upper Respiratory Infection, Adult Most upper respiratory infections (URIs) are a viral infection of the air passages leading to the lungs. A URI affects the nose, throat, and upper air passages. The most common type of URI is nasopharyngitis and is typically referred to as "the common cold." URIs run their course and usually go away on their own. Most of the time, a URI does not require medical attention, but sometimes a bacterial infection in the upper airways can follow a viral infection. This is called a secondary infection. Sinus and middle ear infections are common types of secondary upper respiratory infections. Bacterial pneumonia can also complicate a URI. A URI can worsen asthma and chronic obstructive pulmonary disease (COPD). Sometimes, these complications can require emergency medical care and may be life threatening.  CAUSES Almost all URIs are caused by viruses. A virus is a type of germ and can spread from one person to another.  RISKS FACTORS You may be at risk for a URI if:   You smoke.   You have chronic heart or lung disease.  You have a weakened defense (immune) system.   You are very young or very old.   You have nasal allergies or asthma.  You work in crowded or poorly ventilated areas.  You work in health care facilities or schools. SIGNS AND SYMPTOMS  Symptoms typically develop 2-3 days after you come in contact with a cold virus. Most viral URIs last 7-10 days. However, viral URIs from the influenza virus (flu virus) can last 14-18 days and are typically more severe. Symptoms may include:   Runny or stuffy (congested) nose.   Sneezing.   Cough.   Sore throat.   Headache.   Fatigue.   Fever.   Loss of appetite.   Pain in your forehead, behind your eyes, and over your cheekbones (sinus pain).  Muscle aches.  DIAGNOSIS  Your health care provider may diagnose a URI by:  Physical exam.  Tests to check that your symptoms are not due to  another condition such as:  Strep throat.  Sinusitis.  Pneumonia.  Asthma. TREATMENT  A URI goes away on its own with time. It cannot be cured with medicines, but medicines may be prescribed or recommended to relieve symptoms. Medicines may help:  Reduce your fever.  Reduce your cough.  Relieve nasal congestion. HOME CARE INSTRUCTIONS   Take medicines only as directed by your health care provider.   Gargle warm saltwater or take cough drops to comfort your throat as directed by your health care provider.  Use a warm mist humidifier or inhale steam from a shower to increase air moisture. This may make it easier to breathe.  Drink enough fluid to keep your urine clear or pale yellow.   Eat soups and other clear broths and maintain good nutrition.   Rest as needed.   Return to work when your temperature has returned to normal or as your health care provider advises. You may need to stay home longer to avoid infecting others. You can also use a face mask and careful hand washing to prevent spread of the virus.  Increase the usage of your inhaler if you have asthma.   Do not use any tobacco products, including cigarettes, chewing tobacco, or electronic cigarettes. If you need help quitting, ask your health care provider. PREVENTION  The best way to protect yourself from getting a cold is to practice good hygiene.   Avoid oral or hand contact with people with cold   symptoms.   Wash your hands often if contact occurs.  There is no clear evidence that vitamin C, vitamin E, echinacea, or exercise reduces the chance of developing a cold. However, it is always recommended to get plenty of rest, exercise, and practice good nutrition.  SEEK MEDICAL CARE IF:   You are getting worse rather than better.   Your symptoms are not controlled by medicine.   You have chills.  You have worsening shortness of breath.  You have brown or red mucus.  You have yellow or brown nasal  discharge.  You have pain in your face, especially when you bend forward.  You have a fever.  You have swollen neck glands.  You have pain while swallowing.  You have white areas in the back of your throat. SEEK IMMEDIATE MEDICAL CARE IF:   You have severe or persistent:  Headache.  Ear pain.  Sinus pain.  Chest pain.  You have chronic lung disease and any of the following:  Wheezing.  Prolonged cough.  Coughing up blood.  A change in your usual mucus.  You have a stiff neck.  You have changes in your:  Vision.  Hearing.  Thinking.  Mood. MAKE SURE YOU:   Understand these instructions.  Will watch your condition.  Will get help right away if you are not doing well or get worse.   This information is not intended to replace advice given to you by your health care provider. Make sure you discuss any questions you have with your health care provider.   Document Released: 03/22/2001 Document Revised: 02/10/2015 Document Reviewed: 01/01/2014 Elsevier Interactive Patient Education 2016 [employer]  

## 2015-08-21 LAB — CULTURE, GROUP A STREP: Strep A Culture: NEGATIVE

## 2015-12-31 ENCOUNTER — Emergency Department (HOSPITAL_COMMUNITY)
Admission: EM | Admit: 2015-12-31 | Discharge: 2015-12-31 | Disposition: A | Discharge status: Home / Self Care | Payer: Self-pay | Attending: Emergency Medicine | Admitting: Emergency Medicine

## 2015-12-31 ENCOUNTER — Encounter (HOSPITAL_COMMUNITY): Payer: Self-pay | Admitting: *Deleted

## 2015-12-31 ENCOUNTER — Emergency Department (HOSPITAL_COMMUNITY): Payer: Self-pay

## 2015-12-31 DIAGNOSIS — Z880 Allergy status to penicillin: Secondary | ICD-10-CM | POA: Insufficient documentation

## 2015-12-31 DIAGNOSIS — J159 Unspecified bacterial pneumonia: Secondary | ICD-10-CM | POA: Insufficient documentation

## 2015-12-31 DIAGNOSIS — Z7951 Long term (current) use of inhaled steroids: Secondary | ICD-10-CM | POA: Insufficient documentation

## 2015-12-31 DIAGNOSIS — J189 Pneumonia, unspecified organism: Secondary | ICD-10-CM

## 2015-12-31 DIAGNOSIS — Z8619 Personal history of other infectious and parasitic diseases: Secondary | ICD-10-CM | POA: Insufficient documentation

## 2015-12-31 DIAGNOSIS — Z87891 Personal history of nicotine dependence: Secondary | ICD-10-CM | POA: Insufficient documentation

## 2015-12-31 DIAGNOSIS — Z79899 Other long term (current) drug therapy: Secondary | ICD-10-CM | POA: Insufficient documentation

## 2015-12-31 MED ORDER — BENZONATATE 100 MG PO CAPS: 100.0000 mg | ORAL_CAPSULE | Freq: Three times a day (TID) | ORAL | Status: DC | PRN

## 2015-12-31 MED ORDER — NAPROXEN 500 MG PO TABS: 500.0000 mg | ORAL_TABLET | Freq: Two times a day (BID) | ORAL | Status: DC

## 2015-12-31 MED ORDER — IBUPROFEN 400 MG PO TABS
800.0000 mg | ORAL_TABLET | Freq: Once | ORAL | Status: AC
  Administered 2015-12-31: 800 mg via ORAL
  Filled 2015-12-31: qty 4

## 2015-12-31 MED ORDER — BENZONATATE 100 MG PO CAPS
100.0000 mg | ORAL_CAPSULE | Freq: Once | ORAL | Status: AC
  Administered 2015-12-31: 100 mg via ORAL
  Filled 2015-12-31: qty 1

## 2015-12-31 MED ORDER — DOXYCYCLINE HYCLATE 100 MG PO CAPS: 100.0000 mg | ORAL_CAPSULE | Freq: Two times a day (BID) | ORAL | Status: DC

## 2015-12-31 NOTE — ED Provider Notes (Signed)
CSN: [id number]     Arrival date & time 12/31/15  1451 History  By signing my name below, I, Rowan Blase, attest that this documentation has been prepared under the direction and in the presence of non-physician practitioner, Gloriann Loan, PA-C. Electronically Signed: Rowan Blase, Scribe. 12/31/2015. 4:26 PM.   Chief Complaint  Patient presents with  . Cough   The history is provided by the patient. No language interpreter was used.   HPI Comments:  Taylor Santiago is a 60 y.o. female who presents to the Emergency Department complaining of persistent cough intermittently productive of yellow phlegm and flecks of blood for the past two weeks. Pt reports associated sore throat, subjective fever, headache, body aches, chills, and shortness of breath only when coughing. She has taken OTC cold medications with no relief. She notes she has a h/o URI twice a year; she states current symptoms feel similar. Pt denies h/o smoking or asthma, h/o DVT/PE, recent surgery, recent travel, nausea or vomiting.  Past Medical History  Diagnosis Date  . Medical history non-contributory   . Hepatitis C virus    Past Surgical History  Procedure Laterality Date  . Abdominal hysterectomy    . Appendectomy     Family History  Problem Relation Age of Onset  . Hypertension Mother   . Diabetes Mother   . Heart disease Maternal Grandmother   . Heart disease Maternal Grandfather    Social History  Substance Use Topics  . Smoking status: Former Research scientist (life sciences)  . Smokeless tobacco: Never Used  . Alcohol Use: No   OB History    Gravida Para Term Preterm AB TAB SAB Ectopic Multiple Living   1 1        1      Review of Systems  Constitutional: Positive for fever and chills.  HENT: Positive for sore throat.   Respiratory: Positive for cough and shortness of breath.   Gastrointestinal: Negative for nausea and vomiting.  Musculoskeletal: Positive for myalgias (generalized).  Neurological: Positive for  headaches.  All other systems reviewed and are negative.   Allergies  Hydrocodone and Penicillins  Home Medications   Prior to Admission medications   Medication Sig Start Date End Date Taking? Authorizing Provider  acetaminophen (TYLENOL) 500 MG tablet Take 1 tablet (500 mg total) by mouth every 6 (six) hours as needed. 08/19/15   Gloriann Loan, PA-C  albuterol (PROVENTIL HFA;VENTOLIN HFA) 108 (90 BASE) MCG/ACT inhaler Inhale 2 puffs into the lungs every 6 (six) hours as needed for wheezing or shortness of breath (rescue).    Historical Provider, MD  benzonatate (TESSALON) 100 MG capsule Take 1 capsule (100 mg total) by mouth 3 (three) times daily as needed for cough. 08/19/15   Kyrstan Gotwalt, PA-C  DM-Phenylephrine-Acetaminophen (THERAFLU SEVERE COLD PO) Take 30 mLs by mouth daily as needed (flu symptoms).     Historical Provider, MD  fluticasone (FLONASE) 50 MCG/ACT nasal spray Place 2 sprays into both nostrils daily. 12/29/14   Mercedes Camprubi-Soms, PA-C  naproxen (NAPROSYN) 500 MG tablet Take 1 tablet (500 mg total) by mouth 2 (two) times daily as needed for mild pain, moderate pain or headache (TAKE WITH MEALS.). 12/29/14   Mercedes Camprubi-Soms, PA-C  naproxen sodium (ANAPROX) 220 MG tablet Take 220 mg by mouth daily as needed (for pain).     Historical Provider, MD  Phenyleph-CPM-DM-Aspirin (ALKA-SELTZER PLUS COLD & COUGH PO) Take 1 tablet by mouth daily as needed (flu symptoms).     Historical Provider, MD  Pseudoeph-Doxylamine-DM-APAP (NYQUIL PO) Take 2 capsules by mouth daily as needed (flu symptoms).     Historical Provider, MD   BP 121/90 mmHg  Pulse 72  Temp(Src) 98.2 F (36.8 C) (Oral)  Resp 16  SpO2 100% Physical Exam  Constitutional: She is oriented to person, place, and time. She appears well-developed and well-nourished.  Non-toxic appearance. She does not have a sickly appearance. She does not appear ill.  HENT:  Head: Normocephalic and atraumatic.  Right Ear: Tympanic  membrane and external ear normal.  Left Ear: Tympanic membrane and external ear normal.  Nose: Nose normal.  Mouth/Throat: Uvula is midline, oropharynx is clear and moist and mucous membranes are normal. No oropharyngeal exudate, posterior oropharyngeal edema or posterior oropharyngeal erythema.  Eyes: Conjunctivae are normal. Pupils are equal, round, and reactive to light.  Neck: Normal range of motion. Neck supple.  Cardiovascular: Normal rate, regular rhythm and normal heart sounds.   No murmur heard. Pulmonary/Chest: Effort normal and breath sounds normal. No accessory muscle usage or stridor. No respiratory distress. She has no wheezes. She has no rhonchi. She has no rales.  Abdominal: Soft. Bowel sounds are normal. She exhibits no distension. There is no tenderness.  Musculoskeletal: Normal range of motion.  Lymphadenopathy:    She has no cervical adenopathy.  Neurological: She is alert and oriented to person, place, and time.  Speech clear without dysarthria.  Skin: Skin is warm and dry.  Psychiatric: She has a normal mood and affect. Her behavior is normal.    ED Course  Procedures  DIAGNOSTIC STUDIES:  Oxygen Saturation is 100% on RA, normal by my interpretation.    COORDINATION OF CARE:  4:25 PM Will order chest x-ray and administer cough and pain medication. Discussed treatment plan with pt at bedside and pt agreed to plan.  Labs Review Labs Reviewed - No data to display  Imaging Review Dg Chest 2 View  12/31/2015  CLINICAL DATA:  Persistent cough intermittently productive of yellow phlegm and blood for 2 weeks. Sore throat, fever and headaches. EXAM: CHEST  2 VIEW COMPARISON:  08/19/2015 and 12/29/2014. FINDINGS: The heart size is stable at the upper limits of normal. The mediastinal contours are normal. There are patchy nodular opacities peripherally in the right lung, probably in the upper and middle lobes, best seen on the frontal examination. The left lung is clear.  There is no pleural effusion or pneumothorax. No acute osseous findings are seen. IMPRESSION: Patchy peripheral airspace opacities in the right lung suspicious for inflammation or aspirated blood. Followup PA and lateral chest X-ray is recommended in 3-4 weeks (following trial of antibiotic therapy as clinically warranted) to ensure resolution and exclude underlying malignancy. Electronically Signed   By: Richardean Sale M.D.   On: 12/31/2015 17:18   I have personally reviewed and evaluated these images and lab results as part of my medical decision-making.   EKG Interpretation None      MDM   Final diagnoses:  CAP (community acquired pneumonia)    Patient presents with persistent cough, likely bronchitis.  VSS, NAD.  Subjective fevers, myalgias, and generalized headache.  On exam, heart RRR, lungs CTAB, abdomen soft and benign, patient appears non-toxic or septic.  HENT exam unremarkable.  CENTOR criteria 0, no indication for rapid strep.  Will obtain CXR to evaluate for PNA.  No indication for breathing treatment or prednisone, no wheezing on exam, no hx of asthma.  Patient given tessalon perle and ibuprofen in ED. CXR shows patchy  peripheral airspace opacities in right lung suspicious for inflammation or aspirated blood.  Follow up CXR in 3-4 weeks to ensure resolution and exclude underlying malignancy.  Patient is low risk for CAP using CURB criteria; stable for outpatient treatment.  No recent hospitalization or other risk factors to meet HCAP.  Plan to discharge home with Doxycycline and tessalon perles.  Discussed return precautions.  Patient agrees and acknowledges the above plan for discharge.    I personally performed the services described in this documentation, which was scribed in my presence. The recorded information has been reviewed and is accurate.    Gloriann Loan, PA-C 12/31/15 1742  Virgel Manifold, MD 01/01/16 [phone]

## 2015-12-31 NOTE — ED Notes (Signed)
Pt reports cough, congestion, fevers, sore throat for 2 weeks.

## 2015-12-31 NOTE — Discharge Instructions (Signed)
1. Continue home medications. 2. Start taking Doxycycline twice a day.  Tessalon perles for your cough, and ibuprofen or tylenol for pain. 3.  Follow up with your PCP or this department for repeat chest xray in 3-4 weeks to make sure pneumonia has been cleared.   Community-Acquired Pneumonia, Adult Pneumonia is an infection of the lungs. There are different types of pneumonia. One type can develop while a person is in a hospital. A different type, called community-acquired pneumonia, develops in people who are not, or have not recently been, in the hospital or other health care facility.  CAUSES Pneumonia may be caused by bacteria, viruses, or funguses. Community-acquired pneumonia is often caused by Streptococcus pneumonia bacteria. These bacteria are often passed from one person to another by breathing in droplets from the cough or sneeze of an infected person. RISK FACTORS The condition is more likely to develop in:  People who havechronic diseases, such as chronic obstructive pulmonary disease (COPD), asthma, congestive heart failure, cystic fibrosis, diabetes, or kidney disease.  People who haveearly-stage or late-stage HIV.  People who havesickle cell disease.  People who havehad their spleen removed (splenectomy).  People who havepoor Human resources officer.  People who havemedical conditions that increase the risk of breathing in (aspirating) secretions their own mouth and nose.   People who havea weakened immune system (immunocompromised).  People who smoke.  People whotravel to areas where pneumonia-causing germs commonly exist.  People whoare around animal habitats or animals that have pneumonia-causing germs, including birds, bats, rabbits, cats, and farm animals. SYMPTOMS Symptoms of this condition include:  Adry cough.  A wet (productive) cough.  Fever.  Sweating.  Chest pain, especially when breathing deeply or coughing.  Rapid breathing or difficulty  breathing.  Shortness of breath.  Shaking chills.  Fatigue.  Muscle aches. DIAGNOSIS Your health care provider will take a medical history and perform a physical exam. You may also have other tests, including:  Imaging studies of your chest, including X-rays.  Tests to check your blood oxygen level and other blood gases.  Other tests on blood, mucus (sputum), fluid around your lungs (pleural fluid), and urine. If your pneumonia is severe, other tests may be done to identify the specific cause of your illness. TREATMENT The type of treatment that you receive depends on many factors, such as the cause of your pneumonia, the medicines you take, and other medical conditions that you have. For most adults, treatment and recovery from pneumonia may occur at home. In some cases, treatment must happen in a hospital. Treatment may include:  Antibiotic medicines, if the pneumonia was caused by bacteria.  Antiviral medicines, if the pneumonia was caused by a virus.  Medicines that are given by mouth or through an IV tube.  Oxygen.  Respiratory therapy. Although rare, treating severe pneumonia may include:  Mechanical ventilation. This is done if you are not breathing well on your own and you cannot maintain a safe blood oxygen level.  Thoracentesis. This procedureremoves fluid around one lung or both lungs to help you breathe better. HOME CARE INSTRUCTIONS  Take over-the-counter and prescription medicines only as told by your health care provider.  Only takecough medicine if you are losing sleep. Understand that cough medicine can prevent your body's natural ability to remove mucus from your lungs.  If you were prescribed an antibiotic medicine, take it as told by your health care provider. Do not stop taking the antibiotic even if you start to feel better.  Sleep in  a semi-upright position at night. Try sleeping in a reclining chair, or place a few pillows under your head.  Do  not use tobacco products, including cigarettes, chewing tobacco, and e-cigarettes. If you need help quitting, ask your health care provider.  Drink enough water to keep your urine clear or pale yellow. This will help to thin out mucus secretions in your lungs. PREVENTION There are ways that you can decrease your risk of developing community-acquired pneumonia. Consider getting a pneumococcal vaccine if:  You are older than 60 years of age.  You are older than 60 years of age and are undergoing cancer treatment, have chronic lung disease, or have other medical conditions that affect your immune system. Ask your health care provider if this applies to you. There are different types and schedules of pneumococcal vaccines. Ask your health care provider which vaccination option is best for you. You may also prevent community-acquired pneumonia if you take these actions:  Get an influenza vaccine every year. Ask your health care provider which type of influenza vaccine is best for you.  Go to the dentist on a regular basis.  Wash your hands often. Use hand sanitizer if soap and water are not available. SEEK MEDICAL CARE IF:  You have a fever.  You are losing sleep because you cannot control your cough with cough medicine. SEEK IMMEDIATE MEDICAL CARE IF:  You have worsening shortness of breath.  You have increased chest pain.  Your sickness becomes worse, especially if you are an older adult or have a weakened immune system.  You cough up blood.   This information is not intended to replace advice given to you by your health care provider. Make sure you discuss any questions you have with your health care provider.   Document Released: 09/26/2005 Document Revised: 06/17/2015 Document Reviewed: 01/21/2015 Elsevier Interactive Patient Education [employer].

## 2016-02-15 ENCOUNTER — Encounter (HOSPITAL_COMMUNITY): Payer: Self-pay | Admitting: Emergency Medicine

## 2016-02-15 ENCOUNTER — Emergency Department (HOSPITAL_COMMUNITY)
Admission: EM | Admit: 2016-02-15 | Discharge: 2016-02-15 | Disposition: A | Discharge status: Home / Self Care | Payer: BLUE CROSS/BLUE SHIELD | Attending: Emergency Medicine | Admitting: Emergency Medicine

## 2016-02-15 ENCOUNTER — Emergency Department (HOSPITAL_COMMUNITY): Payer: BLUE CROSS/BLUE SHIELD

## 2016-02-15 DIAGNOSIS — Z87891 Personal history of nicotine dependence: Secondary | ICD-10-CM | POA: Insufficient documentation

## 2016-02-15 DIAGNOSIS — M25572 Pain in left ankle and joints of left foot: Secondary | ICD-10-CM | POA: Diagnosis not present

## 2016-02-15 DIAGNOSIS — Z7951 Long term (current) use of inhaled steroids: Secondary | ICD-10-CM | POA: Insufficient documentation

## 2016-02-15 DIAGNOSIS — M79672 Pain in left foot: Secondary | ICD-10-CM | POA: Diagnosis present

## 2016-02-15 DIAGNOSIS — Z880 Allergy status to penicillin: Secondary | ICD-10-CM | POA: Insufficient documentation

## 2016-02-15 DIAGNOSIS — M25472 Effusion, left ankle: Secondary | ICD-10-CM | POA: Insufficient documentation

## 2016-02-15 DIAGNOSIS — Z8619 Personal history of other infectious and parasitic diseases: Secondary | ICD-10-CM | POA: Diagnosis not present

## 2016-02-15 DIAGNOSIS — Z79899 Other long term (current) drug therapy: Secondary | ICD-10-CM | POA: Diagnosis not present

## 2016-02-15 MED ORDER — NAPROXEN 500 MG PO TABS: 500.0000 mg | ORAL_TABLET | Freq: Two times a day (BID) | ORAL | Status: DC

## 2016-02-15 MED ORDER — NAPROXEN 500 MG PO TABS
500.0000 mg | ORAL_TABLET | Freq: Once | ORAL | Status: AC
  Administered 2016-02-15: 500 mg via ORAL
  Filled 2016-02-15: qty 1

## 2016-02-15 NOTE — ED Provider Notes (Signed)
CSN: [id number]     Arrival date & time 02/15/16  1828 History  By signing my name below, I, Taylor Santiago, attest that this documentation has been prepared under the direction and in the presence of Gloriann Loan, PA-C Electronically Signed: Soijett Santiago, ED Scribe. 02/15/2016. 8:11 PM.   Chief Complaint  Patient presents with  . Foot Pain     The history is provided by the patient. No language interpreter was used.    Taylor Santiago is a 60 y.o. female who presents to the Emergency Department complaining of left foot pain with swelling onset 4 days. Denies injury/trauma to her left foot. Pt states that she stands on hard floors at work. Pt is having associated symptoms of leg cramping at night and redness. She notes that she has tried home remedies without medications for the relief of her symptoms. She denies fever and any other symptoms.   Pt secondarily notes that she was dx with CAP and she completed the abx course. Pt states that she didn't come back for the follow up CXR and would like to be evaluated today for it. Pt denies any other symptoms at this time.    Past Medical History  Diagnosis Date  . Medical history non-contributory   . Hepatitis C virus    Past Surgical History  Procedure Laterality Date  . Abdominal hysterectomy    . Appendectomy     Family History  Problem Relation Age of Onset  . Hypertension Mother   . Diabetes Mother   . Heart disease Maternal Grandmother   . Heart disease Maternal Grandfather    Social History  Substance Use Topics  . Smoking status: Former Research scientist (life sciences)  . Smokeless tobacco: Never Used  . Alcohol Use: No   OB History    Gravida Para Term Preterm AB TAB SAB Ectopic Multiple Living   1 1        1      Review of Systems  Constitutional: Negative for fever.  Musculoskeletal: Positive for joint swelling and arthralgias. Negative for gait problem.  Skin: Positive for color change. Negative for wound.  All other systems reviewed and are  negative.     Allergies  Hydrocodone and Penicillins  Home Medications   Prior to Admission medications   Medication Sig Start Date End Date Taking? Authorizing Provider  albuterol (PROVENTIL HFA;VENTOLIN HFA) 108 (90 BASE) MCG/ACT inhaler Inhale 2 puffs into the lungs every 6 (six) hours as needed for wheezing or shortness of breath (rescue).   Yes Historical Provider, MD  naproxen sodium (ANAPROX) 220 MG tablet Take 220 mg by mouth daily as needed (for pain).    Yes Historical Provider, MD  acetaminophen (TYLENOL) 500 MG tablet Take 1 tablet (500 mg total) by mouth every 6 (six) hours as needed. Patient not taking: Reported on 02/15/2016 08/19/15   Gloriann Loan, PA-C  benzonatate (TESSALON) 100 MG capsule Take 1 capsule (100 mg total) by mouth 3 (three) times daily as needed for cough. Patient not taking: Reported on 02/15/2016 12/31/15   Gloriann Loan, PA-C  doxycycline (VIBRAMYCIN) 100 MG capsule Take 1 capsule (100 mg total) by mouth 2 (two) times daily. Patient not taking: Reported on 02/15/2016 12/31/15   Gloriann Loan, PA-C  fluticasone [facility]) 50 MCG/ACT nasal spray Place 2 sprays into both nostrils daily. Patient not taking: Reported on 02/15/2016 12/29/14   Mercedes Camprubi-Soms, PA-C  naproxen (NAPROSYN) 500 MG tablet Take 1 tablet (500 mg total) by mouth 2 (two) times  daily. 02/15/16   Gloriann Loan, PA-C   BP 145/81 mmHg  Pulse 72  Temp(Src) 98.3 F (36.8 C) (Oral)  Resp 16  Ht 5\' 6"  (1.676 m)  Wt 54.432 kg  BMI 19.38 kg/m2  SpO2 100% Physical Exam  Constitutional: She is oriented to person, place, and time. She appears well-developed and well-nourished.  Non-toxic appearance. She does not have a sickly appearance. She does not appear ill.  HENT:  Head: Normocephalic and atraumatic.  Mouth/Throat: Oropharynx is clear and moist.  Eyes: Conjunctivae are normal. Pupils are equal, round, and reactive to light.  Neck: Normal range of motion. Neck supple.  Cardiovascular: Normal rate,  regular rhythm and normal heart sounds.   No murmur heard. Pulses:      Dorsalis pedis pulses are 2+ on the left side.  Pulmonary/Chest: Effort normal and breath sounds normal. No accessory muscle usage or stridor. No respiratory distress. She has no wheezes. She has no rhonchi. She has no rales.  Abdominal: Soft. Bowel sounds are normal. She exhibits no distension. There is no tenderness.  Musculoskeletal:       Left ankle: She exhibits decreased range of motion (secondary to pain) and swelling. She exhibits no deformity, no laceration and normal pulse. Tenderness. Lateral malleolus tenderness found.       Left foot: Normal.  Lymphadenopathy:    She has no cervical adenopathy.  Neurological: She is alert and oriented to person, place, and time.  Speech clear without dysarthria. Sensation and strength intact bilaterally in lower extremities.    Skin: Skin is warm and dry. There is erythema.  Mild swelling of left ankle with warmth and erythema extending to distal shin. No induration or fluctuance.  Psychiatric: She has a normal mood and affect. Her behavior is normal.  Nursing note and vitals reviewed.   ED Course  Procedures (including critical care time) DIAGNOSTIC STUDIES: Oxygen Saturation is 100% on RA, nl by my interpretation.    COORDINATION OF CARE: 8:11 PM Discussed treatment plan with pt at bedside which includes left ankle, CXR, and pt agreed to plan.    Labs Review Labs Reviewed - No data to display  Imaging Review Dg Chest 2 View  02/15/2016  CLINICAL DATA:  Followup pneumonia EXAM: CHEST  2 VIEW COMPARISON:  12/31/2015 FINDINGS: The heart size and mediastinal contours are within normal limits. No airspace consolidation identified to suggest ongoing infection. Within the right upper lobe there is a cluster of peripheral nodular densities. These are indeterminate but appear new from 04/25/2014. The visualized skeletal structures are unremarkable. IMPRESSION: No evidence  for active pneumonia. Indeterminate peripheral nodular densities in the right upper lobe are new from 04/25/2014. Consider more definitive assessment with nonemergent CT of the chest. Electronically Signed   By: Kerby Moors M.D.   On: 02/15/2016 20:47   Dg Ankle Complete Left  02/15/2016  CLINICAL DATA:  Left ankle pain for 4 days.  No known injury. EXAM: LEFT ANKLE COMPLETE - 3+ VIEW COMPARISON:  None. FINDINGS: Mild lateral soft tissue swelling. No acute bony abnormality. Specifically, no fracture, subluxation, or dislocation. Soft tissues are intact. IMPRESSION: No acute bony abnormality. Electronically Signed   By: Rolm Baptise M.D.   On: 02/15/2016 20:45   I have personally reviewed and evaluated these images as part of my medical decision-making.   EKG Interpretation None      MDM   Final diagnoses:  Left ankle pain   Patient X-Ray negative for obvious fracture or dislocation.  Pt advised to follow up with orthopedics. Patient given ASO brace while in ED, conservative therapy recommended and discussed. Will be discharged with naprosyn Rx. Patient will be discharged home & is agreeable with above plan. Returns precautions discussed. Pt appears safe for discharge.  CXR with IMPRESSION: No evidence for active pneumonia. Indeterminate peripheral nodular densities in the right upper lobe are new from 04/25/2014. Consider more definitive assessment with nonemergent CT of the chest.  Findings discussed with patient. Follow up with PCP to schedule outpatient chest CT. Return precautions discussed. Pt appears safe for discharge.   I personally performed the services described in this documentation, which was scribed in my presence. The recorded information has been reviewed and is accurate.    Gloriann Loan, PA-C 02/15/16 [id number]  Daleen Bo, MD 02/16/16 1048

## 2016-02-15 NOTE — ED Notes (Signed)
Pt states that she has had L foot swelling and pain since Friday. Alert and oriented. Atraumatic.

## 2016-02-15 NOTE — Discharge Instructions (Signed)
Your ankle xray does not show any abnormalities.  This is likely tendonitis.  Apply heat as needed.  Please take Naprosyn twice daily for pain.  Follow up with orthopedics if you have continued pain.  Your repeat chest x-ray shows that your Pneumonia has cleared up; however, they did find indeterminate peripheral nodular densities in the right upper lobe which are new from 04/25/2014.  Consider more definitive assessment with non-emergent CT of the chest.  Please follow up with your primary care physician to schedule a chest CT.   Ankle Pain Ankle pain is a common symptom. The bones, cartilage, tendons, and muscles of the ankle joint perform a lot of work each day. The ankle joint holds your body weight and allows you to move around. Ankle pain can occur on either side or back of 1 or both ankles. Ankle pain may be sharp and burning or dull and aching. There may be tenderness, stiffness, redness, or warmth around the ankle. The pain occurs more often when a person walks or puts pressure on the ankle. CAUSES  There are many reasons ankle pain can develop. It is important to work with your caregiver to identify the cause since many conditions can impact the bones, cartilage, muscles, and tendons. Causes for ankle pain include:  Injury, including a break (fracture), sprain, or strain often due to a fall, sports, or a high-impact activity.  Swelling (inflammation) of a tendon (tendonitis).  Achilles tendon rupture.  Ankle instability after repeated sprains and strains.  Poor foot alignment.  Pressure on a nerve (tarsal tunnel syndrome).  Arthritis in the ankle or the lining of the ankle.  Crystal formation in the ankle (gout or pseudogout). DIAGNOSIS  A diagnosis is based on your medical history, your symptoms, results of your physical exam, and results of diagnostic tests. Diagnostic tests may include X-ray exams or a computerized magnetic scan (magnetic resonance imaging, MRI). TREATMENT    Treatment will depend on the cause of your ankle pain and may include:  Keeping pressure off the ankle and limiting activities.  Using crutches or other walking support (a cane or brace).  Using rest, ice, compression, and elevation.  Participating in physical therapy or home exercises.  Wearing shoe inserts or special shoes.  Losing weight.  Taking medications to reduce pain or swelling or receiving an injection.  Undergoing surgery. HOME CARE INSTRUCTIONS   Only take over-the-counter or prescription medicines for pain, discomfort, or fever as directed by your caregiver.  Put ice on the injured area.  Put ice in a plastic bag.  Place a towel between your skin and the bag.  Leave the ice on for 15-20 minutes at a time, 03-04 times a day.  Keep your leg raised (elevated) when possible to lessen swelling.  Avoid activities that cause ankle pain.  Follow specific exercises as directed by your caregiver.  Record how often you have ankle pain, the location of the pain, and what it feels like. This information may be helpful to you and your caregiver.  Ask your caregiver about returning to work or sports and whether you should drive.  Follow up with your caregiver for further examination, therapy, or testing as directed. SEEK MEDICAL CARE IF:   Pain or swelling continues or worsens beyond 1 week.  You have an oral temperature above 102 F (38.9 C).  You are feeling unwell or have chills.  You are having an increasingly difficult time with walking.  You have loss of sensation or other  new symptoms.  You have questions or concerns. MAKE SURE YOU:   Understand these instructions.  Will watch your condition.  Will get help right away if you are not doing well or get worse.   This information is not intended to replace advice given to you by your health care provider. Make sure you discuss any questions you have with your health care provider.   Document  Released: 03/16/2010 Document Revised: 12/19/2011 Document Reviewed: 04/28/2015 Elsevier Interactive Patient Education [employer].

## 2016-02-24 DIAGNOSIS — M7662 Achilles tendinitis, left leg: Secondary | ICD-10-CM | POA: Insufficient documentation

## 2016-02-29 ENCOUNTER — Other Ambulatory Visit: Payer: Self-pay | Admitting: Internal Medicine

## 2016-02-29 DIAGNOSIS — R918 Other nonspecific abnormal finding of lung field: Secondary | ICD-10-CM

## 2016-03-14 ENCOUNTER — Other Ambulatory Visit: Payer: BLUE CROSS/BLUE SHIELD

## 2016-07-07 ENCOUNTER — Emergency Department (HOSPITAL_COMMUNITY)
Admission: EM | Admit: 2016-07-07 | Discharge: 2016-07-07 | Disposition: A | Discharge status: Home / Self Care | Payer: BLUE CROSS/BLUE SHIELD | Attending: Emergency Medicine | Admitting: Emergency Medicine

## 2016-07-07 ENCOUNTER — Emergency Department (HOSPITAL_COMMUNITY): Payer: BLUE CROSS/BLUE SHIELD

## 2016-07-07 ENCOUNTER — Encounter (HOSPITAL_COMMUNITY): Payer: Self-pay | Admitting: Physical Medicine and Rehabilitation

## 2016-07-07 DIAGNOSIS — Z87891 Personal history of nicotine dependence: Secondary | ICD-10-CM | POA: Insufficient documentation

## 2016-07-07 DIAGNOSIS — J40 Bronchitis, not specified as acute or chronic: Secondary | ICD-10-CM | POA: Insufficient documentation

## 2016-07-07 DIAGNOSIS — R05 Cough: Secondary | ICD-10-CM | POA: Diagnosis present

## 2016-07-07 MED ORDER — IPRATROPIUM-ALBUTEROL 0.5-2.5 (3) MG/3ML IN SOLN
3.0000 mL | Freq: Once | RESPIRATORY_TRACT | Status: AC
  Administered 2016-07-07: 3 mL via RESPIRATORY_TRACT
  Filled 2016-07-07: qty 3

## 2016-07-07 MED ORDER — ALBUTEROL SULFATE HFA 108 (90 BASE) MCG/ACT IN AERS
2.0000 | INHALATION_SPRAY | RESPIRATORY_TRACT | Status: DC | PRN
  Administered 2016-07-07: 2 via RESPIRATORY_TRACT
  Filled 2016-07-07: qty 6.7

## 2016-07-07 MED ORDER — AZITHROMYCIN 250 MG PO TABS: 250.0000 mg | ORAL_TABLET | Freq: Every day | ORAL | 0 refills | Status: DC

## 2016-07-07 NOTE — Discharge Instructions (Signed)
Continue to take your over the cough and congestion medications. Follow up with your doctor or return as needed for worsening symptoms.

## 2016-07-07 NOTE — ED Triage Notes (Signed)
Pt reports productive cough and sinus congestion x2 weeks. No relief with OTC medications.

## 2016-07-07 NOTE — ED Notes (Signed)
Pt d'c home, ambulatory

## 2016-07-07 NOTE — ED Provider Notes (Signed)
[facility] DEPT Provider Note   CSN: [id number] Arrival date & time: 07/07/16  1518   By signing my name below, I, [name], attest that this documentation has been prepared under the direction and in the presence of Debroah Baller, NP Electronically Signed: [name], ED Scribe. 07/07/16. 4:47 PM.  History   Chief Complaint Chief Complaint  Patient presents with  . Cough    HPI  Taylor Santiago is a 60 y.o. female with a PMHx of Hepatitis C, who presents to the Emergency Department complaining of productive cough onset 2 weeks. Pt reports that she initially had bloody sputum that is now yellow-green with intermittent black sputum. Pt states that she has a 20 smoking hx and she has not smoked cigarettes in 12 years. She states that she is having associated symptoms of sore throat due to cough, right ear pain, sinus pressure, subjective fever, chills, generalized body aches, abdominal pain due to cough, nasal congestion, and wheezing. She states that she has tried dayquil, mucinex, and other OTC medications with no relief for her symptoms. She denies nausea, vomiting, and any other symptoms. Denies PMHx of bronchitis or asthma.  Patient is allergic to Penicillin.     The history is provided by the patient. No language interpreter was used.  Cough  This is a new problem. Episode onset: 2 weeks. The problem occurs constantly. The problem has not changed since onset.The cough is productive of sputum (yellow-green and intermittently black). Associated symptoms include chills, sore throat (due to cough), myalgias (generalized) and wheezing. Ear pain: right. She has tried decongestants and cough syrup for the symptoms. The treatment provided no relief. Smoker: 20 year smoking hx, 12 years smoke-free. Her past medical history does not include bronchitis or asthma.    Past Medical History:  Diagnosis Date  . Hepatitis C virus   . Medical history non-contributory     Patient Active  Problem List   Diagnosis Date Noted  . Left hand weakness 05/15/2014  . Numbness and tingling in left arm 05/15/2014  . Hepatitis C, chronic ([place]) 05/15/2014    Past Surgical History:  Procedure Laterality Date  . ABDOMINAL HYSTERECTOMY    . APPENDECTOMY      OB History    Gravida Para Term Preterm AB Living   1 1       1    SAB TAB Ectopic Multiple Live Births                   Home Medications    Prior to Admission medications   Medication Sig Start Date End Date Taking? Authorizing Provider  acetaminophen (TYLENOL) 500 MG tablet Take 1 tablet (500 mg total) by mouth every 6 (six) hours as needed. Patient not taking: Reported on 02/15/2016 08/19/15   Gloriann Loan, PA-C  albuterol (PROVENTIL HFA;VENTOLIN HFA) 108 (90 BASE) MCG/ACT inhaler Inhale 2 puffs into the lungs every 6 (six) hours as needed for wheezing or shortness of breath (rescue).    Historical Provider, MD  azithromycin (ZITHROMAX) 250 MG tablet Take 1 tablet (250 mg total) by mouth daily. Take first 2 tablets together, then 1 every day until finished. 07/07/16   Eyoel Throgmorton Bunnie Pion, NP  benzonatate (TESSALON) 100 MG capsule Take 1 capsule (100 mg total) by mouth 3 (three) times daily as needed for cough. Patient not taking: Reported on 02/15/2016 12/31/15   Gloriann Loan, PA-C  doxycycline (VIBRAMYCIN) 100 MG capsule Take 1 capsule (100 mg total) by mouth 2 (two)  times daily. Patient not taking: Reported on 02/15/2016 12/31/15   Gloriann Loan, PA-C  fluticasone [facility]) 50 MCG/ACT nasal spray Place 2 sprays into both nostrils daily. Patient not taking: Reported on 02/15/2016 12/29/14   Mercedes Camprubi-Soms, PA-C  naproxen (NAPROSYN) 500 MG tablet Take 1 tablet (500 mg total) by mouth 2 (two) times daily. 02/15/16   Gloriann Loan, PA-C  naproxen sodium (ANAPROX) 220 MG tablet Take 220 mg by mouth daily as needed (for pain).     Historical Provider, MD    Family History Family History  Problem Relation Age of Onset  . Hypertension Mother     . Diabetes Mother   . Heart disease Maternal Grandmother   . Heart disease Maternal Grandfather     Social History Social History  Substance Use Topics  . Smoking status: Former Research scientist (life sciences)  . Smokeless tobacco: Never Used  . Alcohol use No     Allergies   Hydrocodone; Penicillins; and Prednisone   Review of Systems Review of Systems  Constitutional: Positive for chills.  HENT: Positive for congestion, sinus pressure and sore throat (due to cough). Ear pain: right.   Eyes: Negative for pain and itching.  Respiratory: Positive for cough and wheezing.   Gastrointestinal: Positive for abdominal pain (due to cough). Negative for nausea and vomiting.  Musculoskeletal: Positive for myalgias (generalized).  Skin: Negative for wound.  Psychiatric/Behavioral: Negative for confusion. The patient is not nervous/anxious.      Physical Exam Updated Vital Signs BP 144/77 (BP Location: Right Arm)   Pulse 74   Temp 98.8 F (37.1 C) (Oral)   Resp 14   SpO2 100%   Physical Exam  Constitutional: She is oriented to person, place, and time. She appears well-developed and well-nourished. No distress.  HENT:  Head: Normocephalic and atraumatic.  Right Ear: Tympanic membrane, external ear and ear canal normal. Tympanic membrane is not erythematous.  Left Ear: Tympanic membrane, external ear and ear canal normal. Tympanic membrane is not erythematous.  Nose: Rhinorrhea present. Right sinus exhibits maxillary sinus tenderness. Right sinus exhibits no frontal sinus tenderness. Left sinus exhibits maxillary sinus tenderness. Left sinus exhibits no frontal sinus tenderness.  Mouth/Throat: Uvula is midline, oropharynx is clear and moist and mucous membranes are normal. No posterior oropharyngeal edema or posterior oropharyngeal erythema.  TM's dull bilaterally without erythema  Eyes: EOM are normal. Pupils are equal, round, and reactive to light.  Neck: Neck supple.  Cardiovascular: Normal rate,  regular rhythm and normal heart sounds.  Exam reveals no gallop and no friction rub.   No murmur heard. Pulmonary/Chest: Effort normal and breath sounds normal. No respiratory distress. She has no wheezes. She has no rales.  Abdominal: Soft. There is no tenderness.  Musculoskeletal: Normal range of motion.  Neurological: She is alert and oriented to person, place, and time.  Skin: Skin is warm and dry.  Psychiatric: She has a normal mood and affect. Her behavior is normal.  Nursing note and vitals reviewed.    ED Treatments / Results  DIAGNOSTIC STUDIES: Oxygen Saturation is 100% on RA, nl by my interpretation.    COORDINATION OF CARE: 4:32 PM Discussed treatment plan with pt at bedside which includes breathing treatment and CXR and pt agreed to plan.    Radiology Dg Chest 2 View  Result Date: 07/07/2016 CLINICAL DATA:  Chest pain and coughing. EXAM: CHEST  2 VIEW COMPARISON:  02/15/2016 FINDINGS: Cardiomediastinal silhouette is normal. Mediastinal contours appear intact. There is no evidence of focal  airspace consolidation, pleural effusion or pneumothorax. Osseous structures are without acute abnormality. Soft tissues are grossly normal. IMPRESSION: No active cardiopulmonary disease. Electronically Signed   By: Fidela Salisbury M.D.   On: 07/07/2016 17:20    Procedures Procedures (including critical care time)  Medications Ordered in ED Medications  albuterol (PROVENTIL HFA;VENTOLIN HFA) 108 (90 Base) MCG/ACT inhaler 2 puff (not administered)  ipratropium-albuterol (DUONEB) 0.5-2.5 (3) MG/3ML nebulizer solution 3 mL (3 mLs Nebulization Given 07/07/16 1639)     Initial Impression / Assessment and Plan / ED Course  I have reviewed the triage vital signs and the nursing notes.  Pertinent imaging results that were available during my care of the patient were reviewed by me and considered in my medical decision making (see chart for details).  Clinical Course    Patient  complaining of symptoms of bronchitis. Moderate symptoms have been present for greater than 10 days with purulent nasal discharge and maxillary sinus pain. CXR negative for acute infiltrate. Patient discharged with azithromycin, albuterol inhaler, and she will continue her OTC medications for cough.  Instructions given for warm saline nasal wash and recommendations for follow-up with primary care physician.     Final Clinical Impressions(s) / ED Diagnoses   Final diagnoses:  Bronchitis    New Prescriptions New Prescriptions   AZITHROMYCIN (ZITHROMAX) 250 MG TABLET    Take 1 tablet (250 mg total) by mouth daily. Take first 2 tablets together, then 1 every day until finished.   I personally performed the services described in this documentation, which was scribed in my presence. The recorded information has been reviewed and is accurate.     [place], NP 07/07/16 [place] Liu, MD 07/09/16 [phone]

## 2016-08-11 ENCOUNTER — Other Ambulatory Visit: Payer: Self-pay | Admitting: Internal Medicine

## 2016-08-11 DIAGNOSIS — Z1231 Encounter for screening mammogram for malignant neoplasm of breast: Secondary | ICD-10-CM

## 2016-08-24 ENCOUNTER — Ambulatory Visit
Admission: RE | Admit: 2016-08-24 | Discharge: 2016-08-24 | Disposition: A | Discharge status: Home / Self Care | Payer: BLUE CROSS/BLUE SHIELD | Source: Ambulatory Visit | Attending: Internal Medicine | Admitting: Internal Medicine

## 2016-08-24 DIAGNOSIS — Z1231 Encounter for screening mammogram for malignant neoplasm of breast: Secondary | ICD-10-CM

## 2016-09-07 ENCOUNTER — Emergency Department (HOSPITAL_COMMUNITY)
Admission: EM | Admit: 2016-09-07 | Discharge: 2016-09-07 | Disposition: A | Discharge status: Home / Self Care | Payer: BLUE CROSS/BLUE SHIELD | Attending: Emergency Medicine | Admitting: Emergency Medicine

## 2016-09-07 ENCOUNTER — Encounter (HOSPITAL_COMMUNITY): Payer: Self-pay

## 2016-09-07 ENCOUNTER — Emergency Department (HOSPITAL_COMMUNITY): Payer: BLUE CROSS/BLUE SHIELD

## 2016-09-07 DIAGNOSIS — Z87891 Personal history of nicotine dependence: Secondary | ICD-10-CM | POA: Insufficient documentation

## 2016-09-07 DIAGNOSIS — Z79899 Other long term (current) drug therapy: Secondary | ICD-10-CM | POA: Diagnosis not present

## 2016-09-07 DIAGNOSIS — B9789 Other viral agents as the cause of diseases classified elsewhere: Secondary | ICD-10-CM

## 2016-09-07 DIAGNOSIS — R05 Cough: Secondary | ICD-10-CM | POA: Diagnosis present

## 2016-09-07 DIAGNOSIS — J069 Acute upper respiratory infection, unspecified: Secondary | ICD-10-CM | POA: Insufficient documentation

## 2016-09-07 MED ORDER — NAPROXEN 500 MG PO TABS: 500.0000 mg | ORAL_TABLET | Freq: Two times a day (BID) | ORAL | 0 refills | Status: DC

## 2016-09-07 MED ORDER — FLUTICASONE PROPIONATE 50 MCG/ACT NA SUSP: 1.0000 | Freq: Every day | NASAL | 0 refills | Status: DC

## 2016-09-07 MED ORDER — BENZONATATE 100 MG PO CAPS: 100.0000 mg | ORAL_CAPSULE | Freq: Three times a day (TID) | ORAL | 0 refills | Status: DC

## 2016-09-07 NOTE — ED Provider Notes (Signed)
[facility] DEPT Provider Note    By signing my name below, I, [name], attest that this documentation has been prepared under the direction and in the presence of [facility], . Electronically Signed: [name], ED Scribe. 09/07/16. 6:56 PM.   History   Chief Complaint Chief Complaint  Patient presents with  . Cough  . Nasal Congestion   The history is provided by the patient and medical records. No language interpreter was used.    HPI Comments:  Taylor Santiago is a 60 y.o. female who presents to the Emergency Department complaining of nasal congestion, sore throat and productive cough of phlegm and some intermittent blood that began 5 days ago. She reports associated popping of bilateral ears, subjective fever, chills and chest/abdominal soreness from coughing. She reports being around a sick child not too long ago. She has not taken anything for her symptoms. She denies modifying factors. She denies nausea, vomiting, abdominal pain, otalgia. She reports allergies to PCN, Hydrocodone and Percocet.   Past Medical History:  Diagnosis Date  . Hepatitis C virus   . Medical history non-contributory     Patient Active Problem List   Diagnosis Date Noted  . Left hand weakness 05/15/2014  . Numbness and tingling in left arm 05/15/2014  . Hepatitis C, chronic ([place]) 05/15/2014    Past Surgical History:  Procedure Laterality Date  . ABDOMINAL HYSTERECTOMY    . APPENDECTOMY      OB History    Gravida Para Term Preterm AB Living   1 1       1    SAB TAB Ectopic Multiple Live Births                   Home Medications    Prior to Admission medications   Medication Sig Start Date End Date Taking? Authorizing Provider  acetaminophen (TYLENOL) 500 MG tablet Take 1 tablet (500 mg total) by mouth every 6 (six) hours as needed. Patient not taking: Reported on 02/15/2016 08/19/15   Gloriann Loan, PA-C  albuterol (PROVENTIL HFA;VENTOLIN HFA) 108 (90 BASE) MCG/ACT  inhaler Inhale 2 puffs into the lungs every 6 (six) hours as needed for wheezing or shortness of breath (rescue).    Historical Provider, MD  azithromycin (ZITHROMAX) 250 MG tablet Take 1 tablet (250 mg total) by mouth daily. Take first 2 tablets together, then 1 every day until finished. 07/07/16   Ayianna Darnold Bunnie Pion, NP  benzonatate (TESSALON) 100 MG capsule Take 1 capsule (100 mg total) by mouth every 8 (eight) hours. 09/07/16   Kabrina Christiano Bunnie Pion, NP  fluticasone (FLONASE) 50 MCG/ACT nasal spray Place 1 spray into both nostrils daily. 09/07/16   Shonette Rhames Bunnie Pion, NP  naproxen (NAPROSYN) 500 MG tablet Take 1 tablet (500 mg total) by mouth 2 (two) times daily. 09/07/16   Caidin Heidenreich Bunnie Pion, NP  naproxen sodium (ANAPROX) 220 MG tablet Take 220 mg by mouth daily as needed (for pain).     Historical Provider, MD    Family History Family History  Problem Relation Age of Onset  . Hypertension Mother   . Diabetes Mother   . Heart disease Maternal Grandmother   . Heart disease Maternal Grandfather     Social History Social History  Substance Use Topics  . Smoking status: Former Research scientist (life sciences)  . Smokeless tobacco: Never Used  . Alcohol use No     Allergies   Hydrocodone; Penicillins; and Prednisone   Review of Systems Review of Systems  Constitutional:  Positive for chills and fever (subjective).  HENT: Positive for congestion and sore throat. Negative for ear pain.   Respiratory: Positive for cough.   Gastrointestinal: Negative for abdominal pain, nausea and vomiting.  Musculoskeletal: Positive for myalgias.  All other systems reviewed and are negative.    Physical Exam Updated Vital Signs BP 143/86 (BP Location: Left Arm)   Pulse 85   Temp 98.3 F (36.8 C) (Oral)   Resp 18   Ht 5\' 7"  (1.702 m)   Wt 133 lb (60.3 kg)   SpO2 97%   BMI 20.83 kg/m   Physical Exam  Constitutional: She is oriented to person, place, and time. She appears well-developed and well-nourished.  HENT:  Head: Normocephalic  and atraumatic.  Right Ear: Tympanic membrane and ear canal normal.  Left Ear: Tympanic membrane and ear canal normal.  Nose: Rhinorrhea present.  Mouth/Throat: Uvula is midline, oropharynx is clear and moist and mucous membranes are normal.  Eyes: Conjunctivae and EOM are normal. Pupils are equal, round, and reactive to light.  Neck: Neck supple.  Cardiovascular: Normal rate and regular rhythm.  Exam reveals no gallop and no friction rub.   No murmur heard. Pulmonary/Chest: Effort normal and breath sounds normal.  Abdominal: Soft. There is no tenderness.  Musculoskeletal: Normal range of motion.  Neurological: She is alert and oriented to person, place, and time. No cranial nerve deficit.  Skin: Skin is warm and dry.  Psychiatric: She has a normal mood and affect. Her behavior is normal.  Nursing note and vitals reviewed.    ED Treatments / Results  DIAGNOSTIC STUDIES: Oxygen Saturation is 97% on RA, normal by my interpretation.   COORDINATION OF CARE: 6:54 PM- Will prescribe Tessalon Perles and Naprosyn. Pt verbalizes understanding and agrees to plan.  Medications - No data to display  Labs (all labs ordered are listed, but only abnormal results are displayed) Labs Reviewed - No data to display  Radiology No results found.  Procedures Procedures (including critical care time)  Medications Ordered in ED Medications - No data to display   Initial Impression / Assessment and Plan / ED Course  I have reviewed the triage vital signs and the nursing notes.  Pertinent imaging results that were available during my care of the patient were reviewed by me and considered in my medical decision making (see chart for details).  Clinical Course     Pt symptoms consistent with URI. CXR negative for acute infiltrate. Pt will be discharged with symptomatic treatment.  Discussed return precautions.  Pt is hemodynamically stable & in NAD prior to discharge.   I personally  performed the services described in this documentation, which was scribed in my presence. The recorded information has been reviewed and is accurate.   Final Clinical Impressions(s) / ED Diagnoses   Final diagnoses:  Viral URI with cough    New Prescriptions Discharge Medication List as of 09/07/2016  7:01 PM       Ashley [place], NP 09/10/16 [place], DO 09/11/16 1648

## 2016-09-07 NOTE — ED Triage Notes (Signed)
Patient complains of 4 days of cough, congestion, chills and body aches. States that she is more congested at night, NAD

## 2016-10-18 ENCOUNTER — Encounter: Payer: Self-pay | Admitting: Gastroenterology

## 2016-10-18 ENCOUNTER — Other Ambulatory Visit (HOSPITAL_COMMUNITY): Payer: Self-pay | Admitting: Nurse Practitioner

## 2016-10-18 DIAGNOSIS — B182 Chronic viral hepatitis C: Secondary | ICD-10-CM

## 2016-11-03 ENCOUNTER — Encounter (HOSPITAL_COMMUNITY): Payer: Self-pay

## 2016-11-03 ENCOUNTER — Emergency Department (HOSPITAL_COMMUNITY): Payer: BLUE CROSS/BLUE SHIELD

## 2016-11-03 ENCOUNTER — Emergency Department (HOSPITAL_COMMUNITY)
Admission: EM | Admit: 2016-11-03 | Discharge: 2016-11-03 | Disposition: A | Discharge status: Home / Self Care | Payer: BLUE CROSS/BLUE SHIELD | Attending: Emergency Medicine | Admitting: Emergency Medicine

## 2016-11-03 DIAGNOSIS — R05 Cough: Secondary | ICD-10-CM | POA: Insufficient documentation

## 2016-11-03 DIAGNOSIS — J069 Acute upper respiratory infection, unspecified: Secondary | ICD-10-CM | POA: Insufficient documentation

## 2016-11-03 DIAGNOSIS — Z87891 Personal history of nicotine dependence: Secondary | ICD-10-CM | POA: Diagnosis not present

## 2016-11-03 DIAGNOSIS — B9789 Other viral agents as the cause of diseases classified elsewhere: Secondary | ICD-10-CM

## 2016-11-03 LAB — RAPID STREP SCREEN (MED CTR MEBANE ONLY): Streptococcus, Group A Screen (Direct): NEGATIVE

## 2016-11-03 MED ORDER — GUAIFENESIN-CODEINE 100-10 MG/5ML PO SOLN: 5.0000 mL | Freq: Three times a day (TID) | ORAL | 0 refills | Status: DC | PRN

## 2016-11-03 NOTE — ED Provider Notes (Signed)
[facility] DEPT Provider Note   CSN: [id number] Arrival date & time: 11/03/16  1416   By signing my name below, I, Taylor Santiago, attest that this documentation has been prepared under the direction and in the presence of Taylor Schools, PA-C. Electronically Signed: Neta Santiago, ED Scribe. 11/03/2016. 5:12 PM.   History   Chief Complaint Chief Complaint  Patient presents with  . URI   The history is provided by the patient. No language interpreter was used.   HPI Comments:  Taylor Santiago is a 61 y.o. female with PMHx of hepatitis C who presents to the Emergency Department complaining of multiple URI symptoms x 3 days. Pt complains of associated sore throat, intermittent left ear pain, generalized body aches, productive cough with green sputum, subjective intermittent fever. Pt reports possible sick contact with grandkids. Pt did get a flu shot in November 2017. Pt took tylenol with no relief. Pt denies vomiting.   Past Medical History:  Diagnosis Date  . Hepatitis C virus   . Medical history non-contributory     Patient Active Problem List   Diagnosis Date Noted  . Left hand weakness 05/15/2014  . Numbness and tingling in left arm 05/15/2014  . Hepatitis C, chronic ([place]) 05/15/2014    Past Surgical History:  Procedure Laterality Date  . ABDOMINAL HYSTERECTOMY    . APPENDECTOMY      OB History    Gravida Para Term Preterm AB Living   1 1       1    SAB TAB Ectopic Multiple Live Births                   Home Medications    Prior to Admission medications   Medication Sig Start Date End Date Taking? Authorizing Provider  acetaminophen (TYLENOL) 500 MG tablet Take 1 tablet (500 mg total) by mouth every 6 (six) hours as needed. Patient not taking: Reported on 02/15/2016 08/19/15   Gloriann Loan, PA-C  albuterol (PROVENTIL HFA;VENTOLIN HFA) 108 (90 BASE) MCG/ACT inhaler Inhale 2 puffs into the lungs every 6 (six) hours as needed for wheezing or shortness of  breath (rescue).    Historical Provider, MD  azithromycin (ZITHROMAX) 250 MG tablet Take 1 tablet (250 mg total) by mouth daily. Take first 2 tablets together, then 1 every day until finished. 07/07/16   Hope Bunnie Pion, NP  benzonatate (TESSALON) 100 MG capsule Take 1 capsule (100 mg total) by mouth every 8 (eight) hours. 09/07/16   Hope Bunnie Pion, NP  fluticasone (FLONASE) 50 MCG/ACT nasal spray Place 1 spray into both nostrils daily. 09/07/16   Hope Bunnie Pion, NP  naproxen (NAPROSYN) 500 MG tablet Take 1 tablet (500 mg total) by mouth 2 (two) times daily. 09/07/16   Hope Bunnie Pion, NP  naproxen sodium (ANAPROX) 220 MG tablet Take 220 mg by mouth daily as needed (for pain).     Historical Provider, MD    Family History Family History  Problem Relation Age of Onset  . Hypertension Mother   . Diabetes Mother   . Heart disease Maternal Grandmother   . Heart disease Maternal Grandfather     Social History Social History  Substance Use Topics  . Smoking status: Former Research scientist (life sciences)  . Smokeless tobacco: Never Used  . Alcohol use No     Allergies   Hydrocodone; Penicillins; and Prednisone   Review of Systems Review of Systems  Constitutional: Positive for fever.  HENT: Positive for ear pain and  sore throat.   Respiratory: Positive for cough.   Gastrointestinal: Negative for vomiting.  Musculoskeletal: Positive for myalgias.  All other systems reviewed and are negative.    Physical Exam Updated Vital Signs BP 139/83 (BP Location: Left Arm)   Pulse 85   Temp 98.6 F (37 C) (Oral)   Resp 14   Ht 5\' 7"  (1.702 m)   Wt 113 lb 14.4 oz (51.7 kg)   SpO2 100%   BMI 17.84 kg/m   Physical Exam  Constitutional: She appears well-developed and well-nourished. No distress.  HENT:  Head: Normocephalic and atraumatic.  Right Ear: Tympanic membrane, external ear and ear canal normal.  Left Ear: Tympanic membrane, external ear and ear canal normal.  Nose: Mucosal edema and rhinorrhea present.    Mouth/Throat: Uvula is midline and oropharynx is clear and moist. No tonsillar exudate.  Eyes: Conjunctivae are normal.  Neck: Normal range of motion. Neck supple.  Cardiovascular: Normal rate and regular rhythm.   Pulmonary/Chest: Effort normal and breath sounds normal.  CTAB   Abdominal: She exhibits no distension.  Lymphadenopathy:    She has no cervical adenopathy.  Neurological: She is alert.  Skin: Skin is warm and dry.  Psychiatric: She has a normal mood and affect.  Nursing note and vitals reviewed.    ED Treatments / Results  DIAGNOSTIC STUDIES:  Oxygen Saturation is 100% on RA, normal by my interpretation.    COORDINATION OF CARE:  5:11 PM Will order cough medicine. Discussed treatment plan with pt at bedside and pt agreed to plan.   Labs (all labs ordered are listed, but only abnormal results are displayed) Labs Reviewed  RAPID STREP SCREEN (NOT AT [facility])  CULTURE, GROUP A STREP [facility])    EKG  EKG Interpretation None       Radiology Dg Chest 2 View  Result Date: 11/03/2016 CLINICAL DATA:  Cough and congestion over the last 4 days. Chest pain. Shortness of breath. EXAM: CHEST  2 VIEW COMPARISON:  09/07/2016 FINDINGS: Heart size is normal. Mediastinal shadows are normal. There is mild scarring at the right lung base. No active infiltrate, mass, effusion or collapse. No significant bone finding. IMPRESSION: No active disease.  Mild chronic scarring right base. Electronically Signed   By: Nelson Chimes M.D.   On: 11/03/2016 15:46    Procedures Procedures (including critical care time)  Medications Ordered in ED Medications - No data to display   Initial Impression / Assessment and Plan / ED Course  Pt symptoms consistent with URI. CXR negative for acute infiltrate. Pt will be discharged with symptomatic treatment.  Discussed return precautions.  Pt is hemodynamically stable & in NAD prior to discharge. Afebrile and not tachycardic.   I have reviewed the  triage vital signs and the nursing notes.  Pertinent labs & imaging results that were available during my care of the patient were reviewed by me and considered in my medical decision making (see chart for details).      Final Clinical Impressions(s) / ED Diagnoses   Final diagnoses:  Viral URI with cough    New Prescriptions Discharge Medication List as of 11/03/2016  5:40 PM    START taking these medications   Details  guaiFENesin-codeine 100-10 MG/5ML syrup Take 5 mLs by mouth 3 (three) times daily as needed for cough., Starting Thu 11/03/2016, Print      I personally performed the services described in this documentation, which was scribed in my presence. The recorded information has been  reviewed and is accurate.     Doristine Devoid, PA-C 11/07/16 [place], MD 11/07/16 [phone]

## 2016-11-03 NOTE — Discharge Instructions (Signed)
This is likely a viral illness that should self resolve in 7-10 days. Stay hydrated with plenty of water. Use your Flonase. Take Mucinex over-the-counter. I'm giving a prescription for cough medicine. If it makes you itch use Benadryl or avoid taking the medication. Follow up with her primary care doctor if her symptoms do not improve. Return to ED if you develop any chest pain, worsening shortness of breath, worsening fevers or any other reason.

## 2016-11-03 NOTE — ED Triage Notes (Signed)
PT C/O COUGH, FEVER, NASAL CONGESTION, SORE THROAT, AND LEFT EAR ACHE ON AND OFF X3 DAYS.

## 2016-11-06 LAB — CULTURE, GROUP A STREP (THRC)

## 2016-11-08 ENCOUNTER — Ambulatory Visit (HOSPITAL_COMMUNITY)
Admission: RE | Admit: 2016-11-08 | Discharge: 2016-11-08 | Disposition: A | Discharge status: Home / Self Care | Payer: BLUE CROSS/BLUE SHIELD | Source: Ambulatory Visit | Attending: Nurse Practitioner | Admitting: Nurse Practitioner

## 2016-11-08 DIAGNOSIS — B182 Chronic viral hepatitis C: Secondary | ICD-10-CM | POA: Diagnosis present

## 2016-11-08 DIAGNOSIS — K740 Hepatic fibrosis: Secondary | ICD-10-CM | POA: Diagnosis not present

## 2016-11-25 ENCOUNTER — Encounter: Payer: Self-pay | Admitting: Gastroenterology

## 2016-11-25 ENCOUNTER — Ambulatory Visit (INDEPENDENT_AMBULATORY_CARE_PROVIDER_SITE_OTHER): Payer: BLUE CROSS/BLUE SHIELD | Admitting: Gastroenterology

## 2016-11-25 VITALS — BP 108/60 | HR 88 | Ht 67.0 in | Wt 131.6 lb

## 2016-11-25 DIAGNOSIS — B182 Chronic viral hepatitis C: Secondary | ICD-10-CM | POA: Diagnosis not present

## 2016-11-25 DIAGNOSIS — K7469 Other cirrhosis of liver: Secondary | ICD-10-CM

## 2016-11-25 NOTE — Progress Notes (Addendum)
[facility] Consult Note:  History: Taylor Santiago 11/25/2016  Referring physician: Maximino Greenland, MD  Reason for consult/chief complaint: Cirrhosis (discuss egd for varices); Dysphagia (occasionally to foods like rice and nuggets; x several months); and Abdominal Pain (mid abdominal pain and ruq abdominal discomfort at times)   Subjective  HPI:  This is a 61 year old woman referred by the [facility] hepatology clinic for cirrhosis and consideration of variceal screening. She has occasional solid food dysphagia and often is right upper quadrant pain that she attributes to her liver disease. It is nonradiating with no clear triggers or relieving factors. She was diagnosed with hepatitis C about 13 years ago and will be starting treatment in the next few months. Recent labs with hepatology clinic revealed platelets of 105, raising concern for cirrhosis. She has never had fluid retention, encephalopathy or an episode of GI bleeding.   ROS:  Review of Systems  Constitutional: Negative for appetite change and unexpected weight change.  HENT: Negative for mouth sores and voice change.   Eyes: Negative for pain and redness.  Respiratory: Negative for cough and shortness of breath.   Cardiovascular: Negative for chest pain and palpitations.  Genitourinary: Negative for dysuria and hematuria.  Musculoskeletal: Positive for back pain. Negative for arthralgias and myalgias.  Skin: Negative for pallor and rash.  Neurological: Negative for weakness and headaches.  Hematological: Negative for adenopathy.     Past Medical History: Past Medical History:  Diagnosis Date  . Abnormal LFTs (liver function tests)   . Cirrhosis ([place])   . Hepatitis C, chronic ([place]) 2004   genotype 1a  . Medical history non-contributory   . Thrombocytopenia ([place])      Past Surgical History: Past Surgical History:  Procedure Laterality Date  . ABDOMINAL HYSTERECTOMY    . APPENDECTOMY    .  COLONOSCOPY  2013   Notes indicate she had a screening colonoscopy in 2013  Family History: Family History  Problem Relation Age of Onset  . Hypertension Mother   . Diabetes Mother   . Ovarian cancer Mother   . Heart disease Maternal Grandmother   . Heart disease Maternal Grandfather   . Colon cancer Neg Hx     Social History: Social History   Social History  . Marital status: Single    Spouse name: N/A  . Number of children: 1  . Years of education: 12th   Occupational History  . Shirt Presser [employer]   Social History Main Topics  . Smoking status: Former Research scientist (life sciences)  . Smokeless tobacco: Never Used  . Alcohol use No     Comment: 6 pack-12 pack a month; as of 11/25/16 "only on holidays"  . Drug use: No  . Sexual activity: No   Other Topics Concern  . None   Social History Narrative  . None    Allergies: Possible reactions to hydrocodone and penicillins  Outpatient Meds: Current Outpatient Prescriptions  Medication Sig Dispense Refill  . acetaminophen (TYLENOL) 500 MG tablet Take 1 tablet (500 mg total) by mouth every 6 (six) hours as needed. 30 tablet 0  . albuterol (PROVENTIL HFA;VENTOLIN HFA) 108 (90 BASE) MCG/ACT inhaler Inhale 2 puffs into the lungs every 6 (six) hours as needed for wheezing or shortness of breath (rescue).    . fluticasone (FLONASE) 50 MCG/ACT nasal spray Place 1 spray into both nostrils daily. 16 g 0   No current facility-administered medications for this visit.       ___________________________________________________________________ Objective  Exam:  BP 108/60   Pulse 88   Ht 5\' 7"  (1.702 m)   Wt 131 lb 9.6 oz (59.7 kg)   BMI 20.61 kg/m    General: this is a(n) Thin African-American woman, decreased muscle mass  She is pleasant and conversational   Eyes: sclera anicteric, no redness  ENT: oral mucosa moist without lesions, no cervical or supraclavicular lymphadenopathy, good dentition  CV: RRR without  murmur, S1/S2, no JVD, no peripheral edema  Resp: clear to auscultation bilaterally, normal RR and effort noted  GI: soft, no tenderness, with active bowel sounds. No guarding or palpable organomegaly noted.  Skin; warm and dry, no rash or jaundice noted  Neuro: awake, alert and oriented x 3. Normal gross motor function and fluent speech  Labs:  From November 2017, WBC 3.8 hemoglobin 12.7 platelets 105 Colon phosphatase 201, total bilirubin 1.0, AST 44, ALT 108, albumin 3.9 creatinine 0.88    Assessment: Encounter Diagnoses  Name Primary?  . Other cirrhosis of liver ([place]) Yes  . Chronic hepatitis C without hepatic coma ([place])     She probably does have cirrhosis as evidenced by thrombocytopenia. She is also continuing to use alcohol regularly.  Plan:  EGD for variceal screening. She is agreeable after explanation of the procedure and its indication.  The benefits and risks of the planned procedure were described in detail with the patient or (when appropriate) their health care proxy.  Risks were outlined as including, but not limited to, bleeding, infection, perforation, adverse medication reaction leading to cardiac or pulmonary decompensation, or pancreatitis (if ERCP).  The limitation of incomplete mucosal visualization was also discussed.  No guarantees or warranties were given.   Thank you for the courtesy of this consult.  Please call me with any questions or concerns.  Nelida Meuse III  CC: Maximino Greenland, MD  [place], NP   [facility] hepatology clinic

## 2016-11-25 NOTE — Patient Instructions (Signed)
If you are age 61 or older, your body mass index should be between 23-30. Your Body mass index is 20.61 kg/m. If this is out of the aforementioned range listed, please consider follow up with your Primary Care Provider.  If you are age 60 or younger, your body mass index should be between 19-25. Your Body mass index is 20.61 kg/m. If this is out of the aformentioned range listed, please consider follow up with your Primary Care Provider.   You have been scheduled for an endoscopy. Please follow written instructions given to you at your visit today. If you use inhalers (even only as needed), please bring them with you on the day of your procedure. Your physician has requested that you go to www.startemmi.com and enter the access code given to you at your visit today. This web site gives a general overview about your procedure. However, you should still follow specific instructions given to you by our office regarding your preparation for the procedure.  Thank you for choosing [facility]  Dr Wilfrid Lund III

## 2016-11-30 ENCOUNTER — Encounter: Payer: Self-pay | Admitting: Gastroenterology

## 2016-12-14 ENCOUNTER — Ambulatory Visit (AMBULATORY_SURGERY_CENTER): Payer: BLUE CROSS/BLUE SHIELD | Admitting: Gastroenterology

## 2016-12-14 ENCOUNTER — Encounter: Payer: Self-pay | Admitting: Gastroenterology

## 2016-12-14 VITALS — BP 133/91 | HR 67 | Temp 96.8°F | Resp 19 | Ht 67.0 in | Wt 131.0 lb

## 2016-12-14 DIAGNOSIS — K7469 Other cirrhosis of liver: Secondary | ICD-10-CM

## 2016-12-14 MED ORDER — SODIUM CHLORIDE 0.9 % IV SOLN: 500.0000 mL | INTRAVENOUS | Status: DC

## 2016-12-14 NOTE — Progress Notes (Signed)
To recovery, report tpo McCoy, RN, VSS

## 2016-12-14 NOTE — Op Note (Signed)
[facility] Patient Name: Taylor Santiago Procedure Date: 12/14/2016 9:59 AM MRN: [id number] Endoscopist: Mallie Mussel L. Loletha Carrow , MD Age: 61 Referring MD:  Date of Birth: [birth year] Gender: Female Account #: [id number] Procedure:                Upper GI endoscopy Indications:              Cirrhosis rule out esophageal varices Medicines:                Monitored Anesthesia Care Procedure:                Pre-Anesthesia Assessment:                           - Prior to the procedure, a History and Physical                            was performed, and patient medications and                            allergies were reviewed. The patient's tolerance of                            previous anesthesia was also reviewed. The risks                            and benefits of the procedure and the sedation                            options and risks were discussed with the patient.                            All questions were answered, and informed consent                            was obtained. Prior Anticoagulants: The patient has                            taken no previous anticoagulant or antiplatelet                            agents. ASA Grade Assessment: III - A patient with                            severe systemic disease. After reviewing the risks                            and benefits, the patient was deemed in                            satisfactory condition to undergo the procedure.                           After obtaining informed consent, the endoscope was  passed under direct vision. Throughout the                            procedure, the patient's blood pressure, pulse, and                            oxygen saturations were monitored continuously. The                            Endoscope was introduced through the mouth, and                            advanced to the second part of duodenum. The upper                            GI endoscopy was  accomplished without difficulty.                            The patient tolerated the procedure well. Scope In: Scope Out: Findings:                 The esophagus was normal.                           The stomach was normal.                           The cardia and gastric fundus were normal on                            retroflexion.                           The examined duodenum was normal. Complications:            No immediate complications. Estimated Blood Loss:     Estimated blood loss: none. Impression:               - Normal esophagus.                           - Normal stomach.                           - Normal examined duodenum.                           - No specimens collected. Recommendation:           - Patient has a contact number available for                            emergencies. The signs and symptoms of potential                            delayed complications were discussed with the  patient. Return to normal activities tomorrow.                            Written discharge instructions were provided to the                            patient.                           - Resume previous diet.                           - Continue present medications.                           Follow up as scheduled in [facility]. Taylor Santiago L. Loletha Carrow, MD 12/14/2016 10:09:56 AM This report has been signed electronically.

## 2016-12-14 NOTE — Patient Instructions (Signed)
Discharge instructions given. Normal exam. Resume previous medications. YOU HAD AN ENDOSCOPIC PROCEDURE TODAY AT THE [facility]:   Refer to the procedure report that was given to you for any specific questions about what was found during the examination.  If the procedure report does not answer your questions, please call your gastroenterologist to clarify.  If you requested that your care partner not be given the details of your procedure findings, then the procedure report has been included in a sealed envelope for you to review at your convenience later.  YOU SHOULD EXPECT: Some feelings of bloating in the abdomen. Passage of more gas than usual.  Walking can help get rid of the air that was put into your GI tract during the procedure and reduce the bloating. If you had a lower endoscopy (such as a colonoscopy or flexible sigmoidoscopy) you may notice spotting of blood in your stool or on the toilet paper. If you underwent a bowel prep for your procedure, you may not have a normal bowel movement for a few days.  Please Note:  You might notice some irritation and congestion in your nose or some drainage.  This is from the oxygen used during your procedure.  There is no need for concern and it should clear up in a day or so.  SYMPTOMS TO REPORT IMMEDIATELY:   Following upper endoscopy (EGD)  Vomiting of blood or coffee ground material  New chest pain or pain under the shoulder blades  Painful or persistently difficult swallowing  New shortness of breath  Fever of 100F or higher  Black, tarry-looking stools  For urgent or emergent issues, a gastroenterologist can be reached at any hour by calling [phone].   DIET:  We do recommend a small meal at first, but then you may proceed to your regular diet.  Drink plenty of fluids but you should avoid alcoholic beverages for 24 hours.  ACTIVITY:  You should plan to take it easy for the rest of today and you should NOT DRIVE or  use heavy machinery until tomorrow (because of the sedation medicines used during the test).    FOLLOW UP: Our staff will call the number listed on your records the next business day following your procedure to check on you and address any questions or concerns that you may have regarding the information given to you following your procedure. If we do not reach you, we will leave a message.  However, if you are feeling well and you are not experiencing any problems, there is no need to return our call.  We will assume that you have returned to your regular daily activities without incident.  If any biopsies were taken you will be contacted by phone or by letter within the next 1-3 weeks.  Please call us at [phone] if you have not heard about the biopsies in 3 weeks.    SIGNATURES/CONFIDENTIALITY: You and/or your care partner have signed paperwork which will be entered into your electronic medical record.  These signatures attest to the fact that that the information above on your After Visit Summary has been reviewed and is understood.  Full responsibility of the confidentiality of this discharge information lies with you and/or your care-partner. 

## 2016-12-15 ENCOUNTER — Telehealth: Payer: Self-pay | Admitting: *Deleted

## 2016-12-15 NOTE — Telephone Encounter (Signed)
  Follow up Call-  Call back number 12/14/2016  Post procedure Call Back phone  # [phone]  Permission to leave phone message Yes  Some recent data might be hidden     Patient questions:  Do you have a fever, pain , or abdominal swelling? No. Pain Score  0 *  Have you tolerated food without any problems? Yes.    Have you been able to return to your normal activities? Yes.    Do you have any questions about your discharge instructions: Diet   No. Medications  No. Follow up visit  No.  Do you have questions or concerns about your Care? No.  Actions: * If pain score is 4 or above: No action needed, pain <4.

## 2017-03-23 ENCOUNTER — Other Ambulatory Visit: Payer: Self-pay | Admitting: Nurse Practitioner

## 2017-03-23 DIAGNOSIS — K7400 Hepatic fibrosis, unspecified: Secondary | ICD-10-CM

## 2017-03-23 DIAGNOSIS — K7469 Other cirrhosis of liver: Secondary | ICD-10-CM

## 2017-03-31 ENCOUNTER — Ambulatory Visit
Admission: RE | Admit: 2017-03-31 | Discharge: 2017-03-31 | Disposition: A | Discharge status: Home / Self Care | Payer: BLUE CROSS/BLUE SHIELD | Source: Ambulatory Visit | Attending: Nurse Practitioner | Admitting: Nurse Practitioner

## 2017-03-31 DIAGNOSIS — K7400 Hepatic fibrosis, unspecified: Secondary | ICD-10-CM

## 2017-08-04 ENCOUNTER — Other Ambulatory Visit: Payer: Self-pay | Admitting: Internal Medicine

## 2017-08-04 DIAGNOSIS — Z1231 Encounter for screening mammogram for malignant neoplasm of breast: Secondary | ICD-10-CM

## 2017-08-29 ENCOUNTER — Ambulatory Visit
Admission: RE | Admit: 2017-08-29 | Discharge: 2017-08-29 | Disposition: A | Discharge status: Home / Self Care | Payer: BLUE CROSS/BLUE SHIELD | Source: Ambulatory Visit | Attending: Internal Medicine | Admitting: Internal Medicine

## 2017-08-29 DIAGNOSIS — Z1231 Encounter for screening mammogram for malignant neoplasm of breast: Secondary | ICD-10-CM

## 2017-10-25 ENCOUNTER — Encounter: Payer: BLUE CROSS/BLUE SHIELD | Admitting: Neurology

## 2018-02-15 ENCOUNTER — Other Ambulatory Visit: Payer: Self-pay | Admitting: Nurse Practitioner

## 2018-02-15 DIAGNOSIS — K7400 Hepatic fibrosis, unspecified: Secondary | ICD-10-CM

## 2018-02-26 ENCOUNTER — Ambulatory Visit
Admission: RE | Admit: 2018-02-26 | Discharge: 2018-02-26 | Disposition: A | Discharge status: Home / Self Care | Payer: BLUE CROSS/BLUE SHIELD | Source: Ambulatory Visit | Attending: Nurse Practitioner | Admitting: Nurse Practitioner

## 2018-02-26 DIAGNOSIS — K7400 Hepatic fibrosis, unspecified: Secondary | ICD-10-CM

## 2018-03-05 ENCOUNTER — Encounter (HOSPITAL_COMMUNITY): Payer: Self-pay | Admitting: Family Medicine

## 2018-03-05 ENCOUNTER — Ambulatory Visit (HOSPITAL_COMMUNITY)
Admission: EM | Admit: 2018-03-05 | Discharge: 2018-03-05 | Disposition: A | Discharge status: Home / Self Care | Payer: BLUE CROSS/BLUE SHIELD | Attending: Family Medicine | Admitting: Family Medicine

## 2018-03-05 DIAGNOSIS — M5410 Radiculopathy, site unspecified: Secondary | ICD-10-CM

## 2018-03-05 LAB — POCT URINALYSIS DIP (DEVICE)
Bilirubin Urine: NEGATIVE
Glucose, UA: NEGATIVE mg/dL
Hgb urine dipstick: NEGATIVE
Ketones, ur: NEGATIVE mg/dL
Leukocytes, UA: NEGATIVE
Nitrite: NEGATIVE
Protein, ur: NEGATIVE mg/dL
SPECIFIC GRAVITY, URINE: 1.015 (ref 1.005–1.030)
UROBILINOGEN UA: 0.2 mg/dL (ref 0.0–1.0)
pH: 5 (ref 5.0–8.0)

## 2018-03-05 MED ORDER — METHYLPREDNISOLONE 8 MG PO TABS
8.0000 mg | ORAL_TABLET | Freq: Every day | ORAL | 0 refills | Status: DC
Start: 2018-03-05 — End: 2018-06-06

## 2018-03-05 NOTE — ED Triage Notes (Addendum)
Pt here for lower back pain radiating into lower abd pain since Friday. She denis any urinary symptoms or constipation. Hurts to breath, cough and move. Taking ibuprofen for pain but not helping.

## 2018-03-05 NOTE — Discharge Instructions (Addendum)
Continue conservative management of rest, ice, heat, and gentle stretches Prescribed prednisone.  Take as directed and to completion Follow up with PCP if symptoms persist Present to ER if worsening or new symptoms (fever, chills, chest pain, abdominal pain, changes in bowel or bladder habits, pain radiating into lower legs, etc...)

## 2018-03-05 NOTE — ED Provider Notes (Signed)
[place]   [id number] 03/05/18 Arrival Time: 3875  SUBJECTIVE: History from: patient. MAKAI AGOSTINELLI is a 62 y.o. female complains bilateral low back pain that began 3 days ago.  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  Localizes the pain to the low back and radiates towards her groin.  Describes the pain as constant and sharp in character.  Has tried OTC medications without relief.  Denies aggravating symptoms.  Denies similar symptoms in the past.  Denies fever, chills, nausea, vomiting, urinary symptoms, erythema, ecchymosis, effusion, weakness, numbness and tingling, loss of bowel or bladder.      ROS: As per HPI.  Past Medical History:  Diagnosis Date  . Abnormal LFTs (liver function tests)   . Cirrhosis ([place])   . Hepatitis C, chronic ([place]) 2004   genotype 1a  . Medical history non-contributory   . Thrombocytopenia ([place])    Past Surgical History:  Procedure Laterality Date  . ABDOMINAL HYSTERECTOMY    . APPENDECTOMY    . COLONOSCOPY  2013   Allergies reviewed Hydrocodone Itching    Penicillins Hives   Percocet [oxycodone-acetaminophen]    Prednisone Rash   Current Facility-Administered Medications on File Prior to Encounter  Medication Dose Route Frequency Provider Last Rate Last Dose  . 0.9 %  sodium chloride infusion  500 mL Intravenous Continuous Doran Stabler, MD       Current Outpatient Medications on File Prior to Encounter  Medication Sig Dispense Refill  . acetaminophen (TYLENOL) 500 MG tablet Take 1 tablet (500 mg total) by mouth every 6 (six) hours as needed. 30 tablet 0  . albuterol (PROVENTIL HFA;VENTOLIN HFA) 108 (90 BASE) MCG/ACT inhaler Inhale 2 puffs into the lungs every 6 (six) hours as needed for wheezing or shortness of breath (rescue).    . fluticasone (FLONASE) 50 MCG/ACT nasal spray Place 1 spray into both nostrils daily. (Patient not taking: Reported on 12/14/2016)  16 g 0   Social History   Socioeconomic History  . Marital status: Single    Spouse name: Not on file  . Number of children: 1  . Years of education: 12th  . Highest education level: Not on file  Occupational History  . Occupation: Counsellor: [employer]  . Financial resource strain: Not on file  . Food insecurity:    Worry: Not on file    Inability: Not on file  . Transportation needs:    Medical: Not on file    Non-medical: Not on file  Tobacco Use  . Smoking status: Former Research scientist (life sciences)  . Smokeless tobacco: Never Used  Substance and Sexual Activity  . Alcohol use: No    Comment: 6 pack-12 pack a month; as of 11/25/16 "only on holidays"  . Drug use: No  . Sexual activity: Never  Lifestyle  . Physical activity:    Days per week: Not on file    Minutes per session: Not on file  . Stress: Not on file  Relationships  . Social connections:    Talks on phone: Not on file    Gets together: Not on file    Attends religious service: Not on file    Active member of club or organization: Not on file    Attends meetings of clubs or organizations: Not on file    Relationship status: Not on file  . Intimate partner violence:  Fear of current or ex partner: Not on file    Emotionally abused: Not on file    Physically abused: Not on file    Forced sexual activity: Not on file  Other Topics Concern  . Not on file  Social History Narrative  . Not on file   Family History  Problem Relation Age of Onset  . Hypertension Mother   . Diabetes Mother   . Ovarian cancer Mother   . Heart disease Maternal Grandmother   . Heart disease Maternal Grandfather   . Colon cancer Neg Hx     OBJECTIVE:  Vitals:   03/05/18 1214  BP: (!) 134/97  Pulse: 70  Resp: 18  Temp: 98.2 F (36.8 C)  SpO2: 100%    General appearance: AOx3; in no acute distress Head: NCAT Lungs: CTA bilaterally Heart: RRR.  Clear S1 and S2 without murmur, gallops, or  rubs.  Radial pulses 2+ bilaterally. Musculoskeletal: Back Inspection: Skin warm, dry, clear and intact without obvious erythema, effusion, or ecchymosis.  Palpation: Diffusely tender about the lumbar spine; particularly tender about the paravertebral muscles ROM: LROM Strength: 5/5 shld abduction, 5/5 shld adduction, 5/5 elbow flexion, 5/5 elbow extension, 5/5 grip strength, 5/5 hip flexion, 5/5 knee abduction, 5/5 knee adduction, 5/5 knee flexion, 5/5 knee extension, 5/5 dorsiflexion DTR: 2+ patellar reflex bilateral and symmetrical Skin: warm and dry Neurologic: Ambulates with minimal difficulty; CN 2-12 grossly intact Sensation intact about the upper/ lower extremities Psychological: alert and cooperative; normal mood and affect  Recent Results (from the past 2160 hour(s))  POCT urinalysis dip (device)     Status: None   Collection Time: 03/05/18 12:46 PM  Result Value Ref Range   Glucose, UA NEGATIVE NEGATIVE mg/dL   Bilirubin Urine NEGATIVE NEGATIVE   Ketones, ur NEGATIVE NEGATIVE mg/dL   Specific Gravity, Urine 1.015 1.005 - 1.030   Hgb urine dipstick NEGATIVE NEGATIVE   pH 5.0 5.0 - 8.0   Protein, ur NEGATIVE NEGATIVE mg/dL   Urobilinogen, UA 0.2 0.0 - 1.0 mg/dL   Nitrite NEGATIVE NEGATIVE   Leukocytes, UA NEGATIVE NEGATIVE    Comment: Biochemical Testing Only. Please order routine urinalysis from main lab if confirmatory testing is needed.    ASSESSMENT & PLAN:  1. Acute low back pain with radicular symptoms, duration less than 6 weeks     Meds ordered this encounter  Medications  . methylPREDNISolone (MEDROL) 8 MG tablet    Sig: Take 1 tablet (8 mg total) by mouth daily.    Dispense:  5 tablet    Refill:  0    Order Specific Question:   Supervising Provider    Answer:   Wynona Luna [[id number]]    Continue conservative management of rest, ice, heat, and gentle stretches Prescribed prednisone.  Take as directed and to completion Follow up with PCP if  symptoms persist Present to ER if worsening or new symptoms (fever, chills, chest pain, abdominal pain, changes in bowel or bladder habits, pain radiating into lower legs, etc...)   Reviewed expectations re: course of current medical issues. Questions answered. Outlined signs and symptoms indicating need for more acute intervention. Patient verbalized understanding. After Visit Summary given.    Lestine Box, PA-C 03/05/18 1313

## 2018-06-06 ENCOUNTER — Ambulatory Visit (HOSPITAL_COMMUNITY)
Admission: EM | Admit: 2018-06-06 | Discharge: 2018-06-06 | Disposition: A | Discharge status: Home / Self Care | Payer: BLUE CROSS/BLUE SHIELD | Attending: Family Medicine | Admitting: Family Medicine

## 2018-06-06 ENCOUNTER — Encounter (HOSPITAL_COMMUNITY): Payer: Self-pay | Admitting: Emergency Medicine

## 2018-06-06 DIAGNOSIS — B9789 Other viral agents as the cause of diseases classified elsewhere: Secondary | ICD-10-CM | POA: Diagnosis not present

## 2018-06-06 DIAGNOSIS — J069 Acute upper respiratory infection, unspecified: Secondary | ICD-10-CM

## 2018-06-06 MED ORDER — CETIRIZINE HCL 10 MG PO CAPS: 10.0000 mg | ORAL_CAPSULE | Freq: Every day | ORAL | 0 refills | Status: DC

## 2018-06-06 MED ORDER — PSEUDOEPH-BROMPHEN-DM 30-2-10 MG/5ML PO SYRP: 5.0000 mL | ORAL_SOLUTION | Freq: Three times a day (TID) | ORAL | 0 refills | Status: DC | PRN

## 2018-06-06 MED ORDER — FLUTICASONE PROPIONATE 50 MCG/ACT NA SUSP: 1.0000 | Freq: Every day | NASAL | 0 refills | Status: DC

## 2018-06-06 NOTE — ED Triage Notes (Signed)
Pt sts URI sx with cough and body aches

## 2018-06-06 NOTE — Discharge Instructions (Addendum)
Symptoms likely from a viral upper respiratory infection Please continue symptomatic management, I expect her symptoms to gradually improve over the next week Please begin daily allergy pill like Zyrtec or Claritin, sent in generic version of Zyrtec Please use Flonase nasal spray 1 to 2 sprays in each nostril daily May use cough syrup also as needed to help with cough and congestion. Ibuprofen for body aches and headache  Please continue to monitor your symptoms, please return if not improving by the end of the weekend, please also return if symptoms worsening, developing shortness of breath, chest discomfort, fever.

## 2018-06-06 NOTE — ED Provider Notes (Addendum)
[facility]    CSN: [id number] Arrival date & time: 06/06/18  1238     History   Chief Complaint Chief Complaint  Patient presents with  . URI    HPI Taylor Santiago is a 62 y.o. female history of cirrhosis presenting today for evaluation of URI symptoms.Patient is presenting with URI symptoms- congestion, cough, sore throat.  Also noting watery eyes, hot and cold chills, subjective fevers.  Cough is mainly at nighttime, minimal cough during the day.  Patient's main complaints are body aches. Symptoms have been going on for 3 days. Patient has tried Alka-Seltzer plus and NyQuil, with minimal relief. Denies fever, nausea, vomiting, diarrhea. Denies shortness of breath and chest pain.  Patient smokes approximately 1 to 2 cigarettes a day.  Denies any lower extremity swelling or leg pain.    HPI  Past Medical History:  Diagnosis Date  . Abnormal LFTs (liver function tests)   . Cirrhosis ([place])   . Hepatitis C, chronic ([place]) 2004   genotype 1a  . Medical history non-contributory   . Thrombocytopenia [facility])     Patient Active Problem List   Diagnosis Date Noted  . Left hand weakness 05/15/2014  . Numbness and tingling in left arm 05/15/2014  . Hepatitis C, chronic ([place]) 05/15/2014    Past Surgical History:  Procedure Laterality Date  . ABDOMINAL HYSTERECTOMY    . APPENDECTOMY    . COLONOSCOPY  2013    OB History    Gravida  1   Para  1   Term      Preterm      AB      Living  1     SAB      TAB      Ectopic      Multiple      Live Births               Home Medications    Prior to Admission medications   Medication Sig Start Date End Date Taking? Authorizing Provider  acetaminophen (TYLENOL) 500 MG tablet Take 1 tablet (500 mg total) by mouth every 6 (six) hours as needed. 08/19/15   Gloriann Loan, PA-C  albuterol (PROVENTIL HFA;VENTOLIN HFA) 108 (90 BASE) MCG/ACT inhaler Inhale 2 puffs into the lungs every 6 (six) hours as needed for  wheezing or shortness of breath (rescue).    [provider]  brompheniramine-pseudoephedrine-DM 30-2-10 MG/5ML syrup Take 5 mLs by mouth 3 (three) times daily as needed. 06/06/18   Nahdia Doucet C, PA-C  Cetirizine HCl 10 MG CAPS Take 1 capsule (10 mg total) by mouth daily for 15 days. 06/06/18 06/21/18  Briante Loveall C, PA-C  fluticasone (FLONASE) 50 MCG/ACT nasal spray Place 1-2 sprays into both nostrils daily for 7 days. 06/06/18 06/13/18  Logann Whitebread, Elesa Hacker, PA-C    Family History Family History  Problem Relation Age of Onset  . Hypertension Mother   . Diabetes Mother   . Ovarian cancer Mother   . Heart disease Maternal Grandmother   . Heart disease Maternal Grandfather   . Colon cancer Neg Hx     Social History Social History   Tobacco Use  . Smoking status: Former Research scientist (life sciences)  . Smokeless tobacco: Never Used  Substance Use Topics  . Alcohol use: No    Comment: 6 pack-12 pack a month; as of 11/25/16 "only on holidays"  . Drug use: No     Allergies   Hydrocodone; Penicillins; Percocet [oxycodone-acetaminophen]; and Prednisone  Review of Systems Review of Systems  Constitutional: Positive for fatigue and fever. Negative for activity change, appetite change and chills.  HENT: Positive for congestion, rhinorrhea and sore throat. Negative for ear pain, sinus pressure and trouble swallowing.   Eyes: Negative for discharge and redness.  Respiratory: Positive for cough. Negative for chest tightness and shortness of breath.   Cardiovascular: Negative for chest pain.  Gastrointestinal: Negative for abdominal pain, diarrhea, nausea and vomiting.  Musculoskeletal: Positive for myalgias.  Skin: Negative for rash.  Neurological: Negative for dizziness, light-headedness and headaches.     Physical Exam Triage Vital Signs ED Triage Vitals [06/06/18 1312]  Enc Vitals Group     BP 136/82     Pulse Rate 68     Resp 18     Temp 97.7 F (36.5 C)     Temp Source Oral      SpO2 96 %     Weight      Height      Head Circumference      Peak Flow      Pain Score      Pain Loc      Pain Edu?      Excl. in [place]?    No data found.  Updated Vital Signs BP 136/82 (BP Location: Left Arm)   Pulse 68   Temp 97.7 F (36.5 C) (Oral)   Resp 18   SpO2 96%   Visual Acuity Right Eye Distance:   Left Eye Distance:   Bilateral Distance:    Right Eye Near:   Left Eye Near:    Bilateral Near:     Physical Exam  Constitutional: She appears well-developed and well-nourished. No distress.  HENT:  Head: Normocephalic and atraumatic.  Bilateral ears without tenderness to palpation of external auricle, tragus and mastoid, EAC's without erythema or swelling, TM's with good bony landmarks and cone of light. Non erythematous.  Oral mucosa pink and moist, no tonsillar enlargement or exudate. Posterior pharynx patent and erythematous, no uvula deviation or swelling.  Hoarse voice.  Eyes: Conjunctivae are normal.  Neck: Neck supple.  Cardiovascular: Normal rate and regular rhythm.  No murmur heard. Pulmonary/Chest: Effort normal and breath sounds normal. No respiratory distress.  Breathing comfortably at rest, CTABL, no wheezing, rales or other adventitious sounds auscultated  Abdominal: Soft. There is no tenderness.  Musculoskeletal: She exhibits no edema.  Neurological: She is alert.  Skin: Skin is warm and dry.  Psychiatric: She has a normal mood and affect.  Nursing note and vitals reviewed.    UC Treatments / Results  Labs (all labs ordered are listed, but only abnormal results are displayed) Labs Reviewed - No data to display  EKG None  Radiology No results found.  Procedures Procedures (including critical care time)  Medications Ordered in UC Medications - No data to display  Initial Impression / Assessment and Plan / UC Course  I have reviewed the triage vital signs and the nursing notes.  Pertinent labs & imaging results that were available  during my care of the patient were reviewed by me and considered in my medical decision making (see chart for details).     Patient with URI symptoms for 3 days, no fever, no tachycardia, exam unremarkable.  Most likely with viral illness.  Cough worse at nighttime, likely related to drainage.  Will begin on daily allergy pill and provide nasal steroid sprays to help with congestion and drainage.  Cough syrup provided to help with  cough and congestion as well.  Ibuprofen for body aches and headache, avoid Tylenol given liver history.  Continue to monitor symptoms, return if worsening or not improving.Discussed strict return precautions. Patient verbalized understanding and is agreeable with plan.  Final Clinical Impressions(s) / UC Diagnoses   Final diagnoses:  Viral URI with cough     Discharge Instructions     Symptoms likely from a viral upper respiratory infection Please continue symptomatic management, I expect her symptoms to gradually improve over the next week Please begin daily allergy pill like Zyrtec or Claritin, sent in generic version of Zyrtec Please use Flonase nasal spray 1 to 2 sprays in each nostril daily May use cough syrup also as needed to help with cough and congestion. Ibuprofen for body aches and headache  Please continue to monitor your symptoms, please return if not improving by the end of the weekend, please also return if symptoms worsening, developing shortness of breath, chest discomfort, fever.    ED Prescriptions    Medication Sig Dispense Auth. Provider   fluticasone (FLONASE) 50 MCG/ACT nasal spray Place 1-2 sprays into both nostrils daily for 7 days. 1 g Demara Lover C, PA-C   Cetirizine HCl 10 MG CAPS Take 1 capsule (10 mg total) by mouth daily for 15 days. 15 capsule Lamonda Noxon C, PA-C   brompheniramine-pseudoephedrine-DM 30-2-10 MG/5ML syrup Take 5 mLs by mouth 3 (three) times daily as needed. 120 mL Terryl Niziolek C, PA-C     Controlled  Substance Prescriptions [place] Controlled Substance Registry consulted? Not Applicable   Janith Lima, PA-C 06/06/18 [address] [place], [place] 06/06/18 1438

## 2018-07-26 ENCOUNTER — Other Ambulatory Visit: Payer: Self-pay | Admitting: Internal Medicine

## 2018-07-26 DIAGNOSIS — Z1231 Encounter for screening mammogram for malignant neoplasm of breast: Secondary | ICD-10-CM

## 2018-08-21 ENCOUNTER — Other Ambulatory Visit: Payer: Self-pay | Admitting: Internal Medicine

## 2018-08-30 ENCOUNTER — Ambulatory Visit
Admission: RE | Admit: 2018-08-30 | Discharge: 2018-08-30 | Disposition: A | Discharge status: Home / Self Care | Payer: BLUE CROSS/BLUE SHIELD | Source: Ambulatory Visit | Attending: Internal Medicine | Admitting: Internal Medicine

## 2018-08-30 DIAGNOSIS — Z1231 Encounter for screening mammogram for malignant neoplasm of breast: Secondary | ICD-10-CM

## 2018-09-27 ENCOUNTER — Ambulatory Visit: Payer: BLUE CROSS/BLUE SHIELD | Admitting: Nurse Practitioner

## 2018-09-27 ENCOUNTER — Encounter: Payer: Self-pay | Admitting: Nurse Practitioner

## 2018-09-27 VITALS — BP 136/76 | HR 73 | Temp 97.6°F | Ht 66.0 in | Wt 135.4 lb

## 2018-09-27 DIAGNOSIS — M810 Age-related osteoporosis without current pathological fracture: Secondary | ICD-10-CM | POA: Diagnosis not present

## 2018-09-27 NOTE — Progress Notes (Signed)
Subjective:     Patient ID: Taylor Santiago , female    DOB: 1956-09-04 , 62 y.o.   MRN: [id number]  Chief Complaint  Patient presents with  . Prolia    HPI  Here for prolia injection.  She does have joint pain to her elbows.      Past Medical History:  Diagnosis Date  . Abnormal LFTs (liver function tests)   . Cirrhosis ([place])   . Hepatitis C, chronic ([place]) 2004   genotype 1a  . Medical history non-contributory   . Thrombocytopenia ([place])      Family History  Problem Relation Age of Onset  . Hypertension Mother   . Diabetes Mother   . Ovarian cancer Mother   . Heart disease Maternal Grandmother   . Heart disease Maternal Grandfather   . Colon cancer Neg Hx   . Breast cancer Neg Hx      Current Outpatient Medications:  .  acetaminophen (TYLENOL) 500 MG tablet, Take 1 tablet (500 mg total) by mouth every 6 (six) hours as needed., Disp: 30 tablet, Rfl: 0 .  albuterol (PROVENTIL HFA;VENTOLIN HFA) 108 (90 BASE) MCG/ACT inhaler, Inhale 2 puffs into the lungs every 6 (six) hours as needed for wheezing or shortness of breath (rescue)., Disp: , Rfl:  .  Cetirizine HCl 10 MG CAPS, Take 1 capsule (10 mg total) by mouth daily for 15 days., Disp: 15 capsule, Rfl: 0 .  fluticasone (FLONASE) 50 MCG/ACT nasal spray, Place 1-2 sprays into both nostrils daily for 7 days., Disp: 1 g, Rfl: 0 .  PROLIA 60 MG/ML SOSY injection, INJECT 60 MG UNDER THE SKIN EVERY 6 MONTHS., Disp: 1 Syringe, Rfl: 1  Current Facility-Administered Medications:  .  0.9 %  sodium chloride infusion, 500 mL, Intravenous, Continuous, Danis, Estill Cotta III, MD .  denosumab (PROLIA) injection 60 mg, 60 mg, Subcutaneous, Once, [name], FNP   Allergies  Allergen Reactions  . Hydrocodone Itching  . Penicillins Hives    Sweats Has patient had a PCN reaction causing immediate rash, facial/tongue/throat swelling, SOB or lightheadedness with hypotension: No Has patient had a PCN reaction causing severe rash involving  mucus membranes or skin necrosis:No Has patient had a PCN reaction that required hospitalization No Has patient had a PCN reaction occurring within the last 10 years: no   If all of the above answers are "NO", then may proceed with Cephalosporin use.   Marland Kitchen Percocet [Oxycodone-Acetaminophen]   . Prednisone Rash     Review of Systems  Respiratory: Negative.   Cardiovascular: Negative.  Negative for chest pain, palpitations and leg swelling.  Musculoskeletal: Positive for arthralgias. Negative for gait problem and myalgias.  Skin: Negative.      Today's Vitals   09/27/18 1553  BP: 136/76  Pulse: 73  Temp: 97.6 F (36.4 C)  TempSrc: Oral  SpO2: 96%  Weight: 135 lb 6.4 oz (61.4 kg)  Height: 5\' 6"  (1.676 m)  PainSc: 0-No pain   Body mass index is 21.85 kg/m.   Objective:  Physical Exam Constitutional:      Appearance: Normal appearance.  Pulmonary:     Effort: Pulmonary effort is normal.     Breath sounds: Normal breath sounds.  Skin:    Capillary Refill: Capillary refill takes less than 2 seconds.  Neurological:     General: No focal deficit present.     Mental Status: She is alert and oriented to person, place, and time.  Assessment And Plan:   1. Age-related osteoporosis without current pathological fracture  Doing well with prolia injection  Prolia injection given in office  Continue taking calcium and vitamin d       [name], FNP

## 2018-10-02 DIAGNOSIS — M810 Age-related osteoporosis without current pathological fracture: Secondary | ICD-10-CM | POA: Insufficient documentation

## 2018-10-02 MED ORDER — DENOSUMAB 60 MG/ML ~~LOC~~ SOSY: 60.0000 mg | PREFILLED_SYRINGE | Freq: Once | SUBCUTANEOUS | Status: DC

## 2018-10-13 IMAGING — US US ABDOMEN LIMITED
1 series · 14 of 25 positions shown · non-contrast
Comparison: November 08, 2016

CLINICAL DATA: History of hepatic fibrosis. Patient has received
therapy for hepatitis-C

EXAM:
ULTRASOUND ABDOMEN LIMITED RIGHT UPPER QUADRANT

[Series 1: us abdomen limited · 0.14mm/px · 14 of 45 slices shown]
[im 1/45]
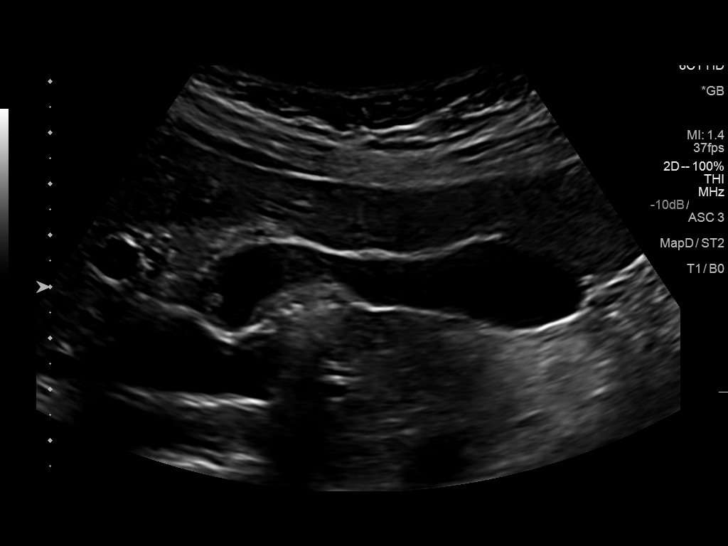
[im 4/45]
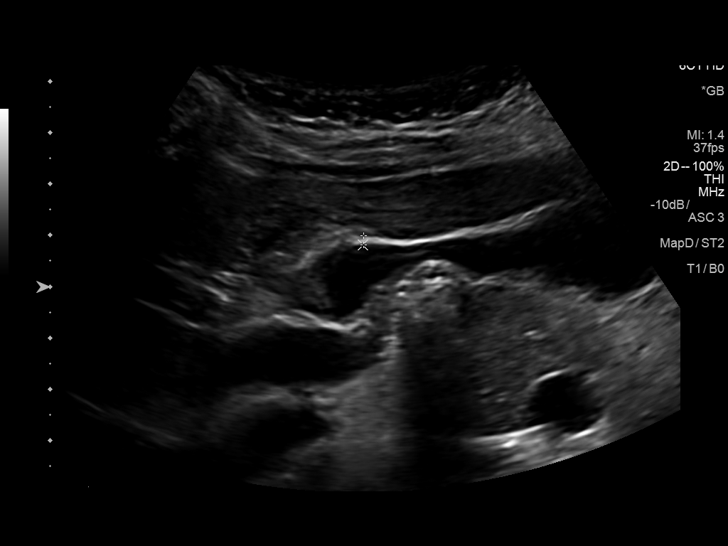
[im 8/45]
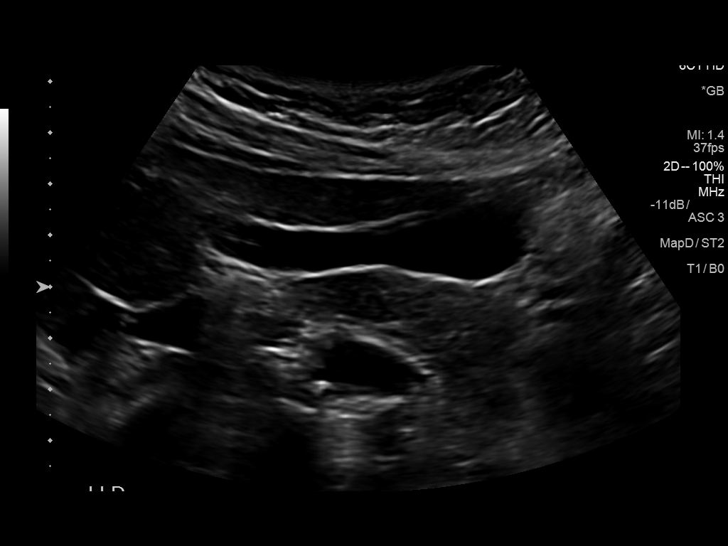
[im 12/45]
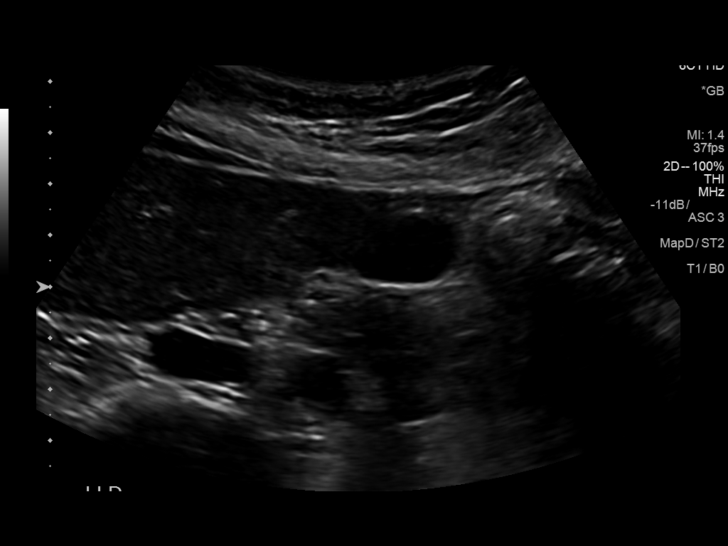
[im 15/45]
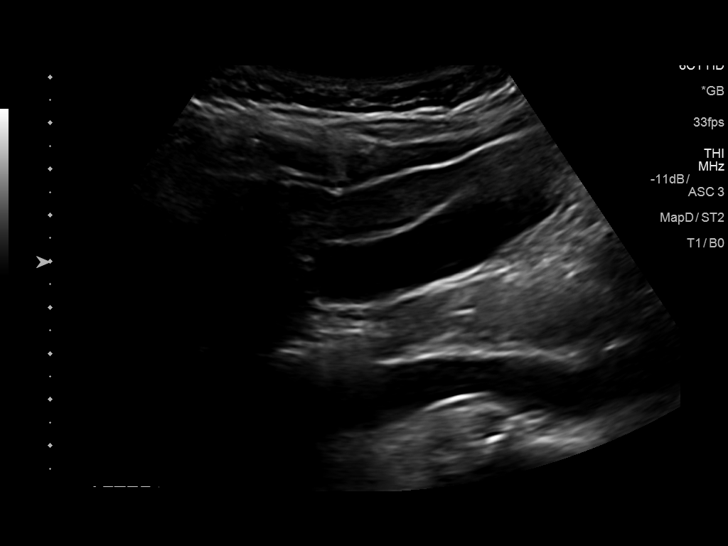
[im 17/45]
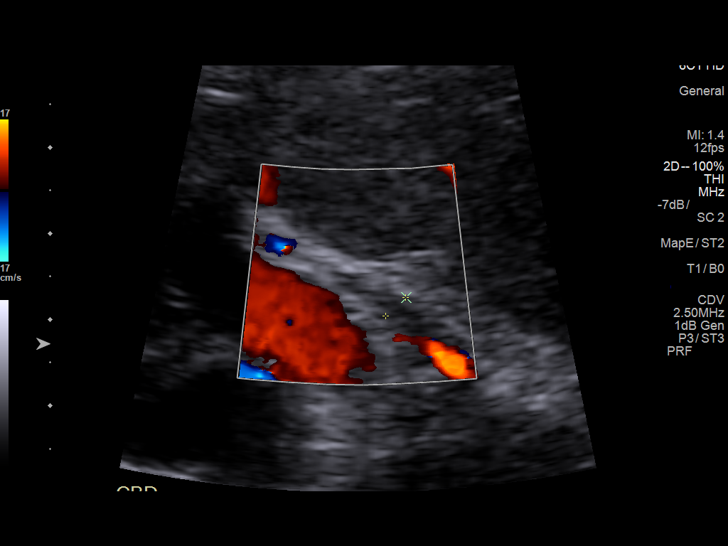
[im 21/45]
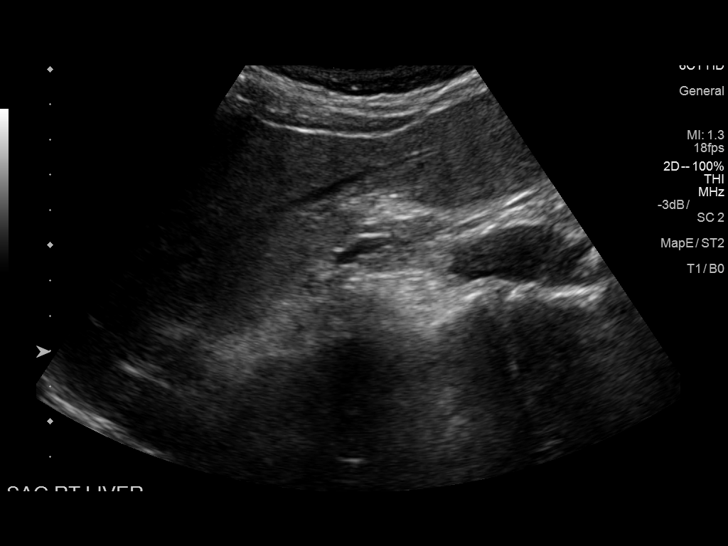
[im 24/45]
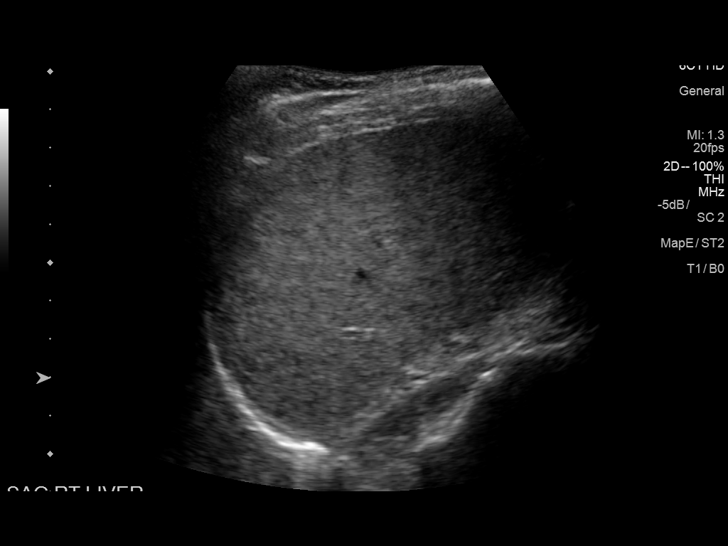
[im 28/45]
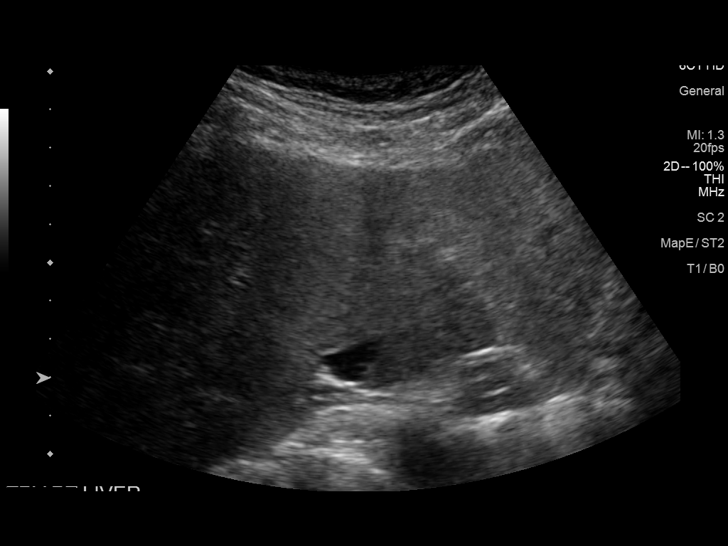
[im 30/45]
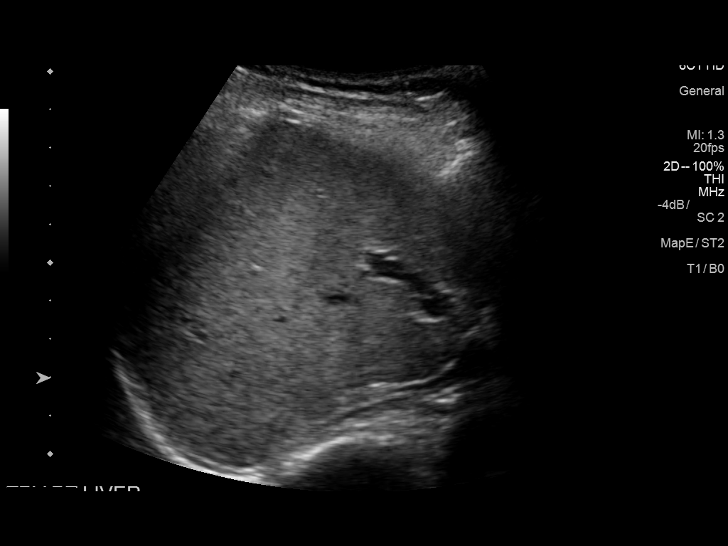
[im 34/45]
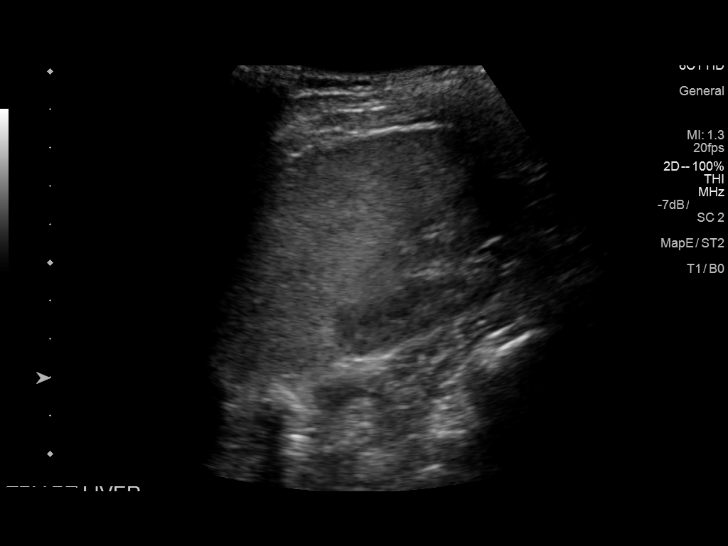
[im 37/45]
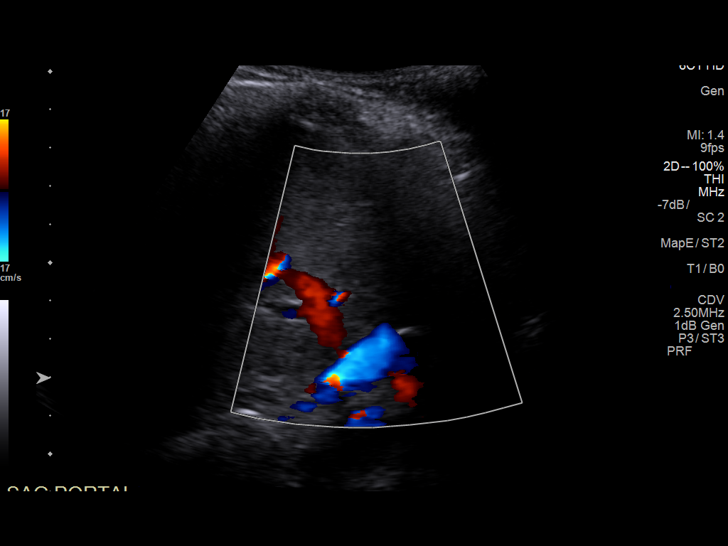
[im 41/45]
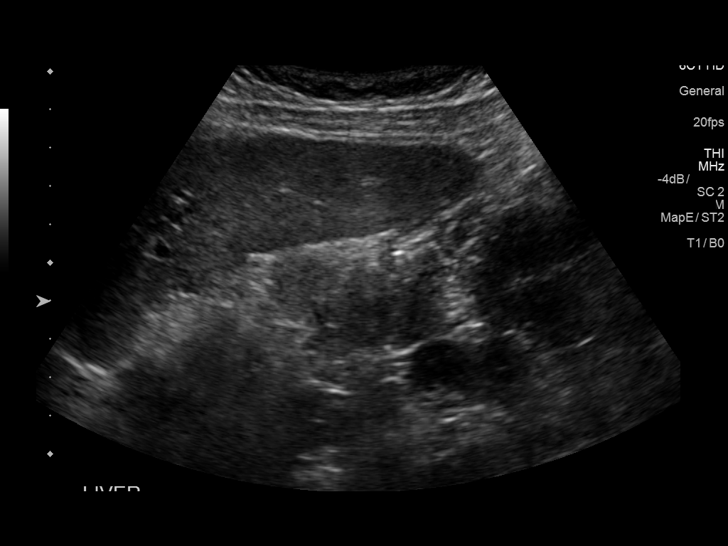
[im 45/45]
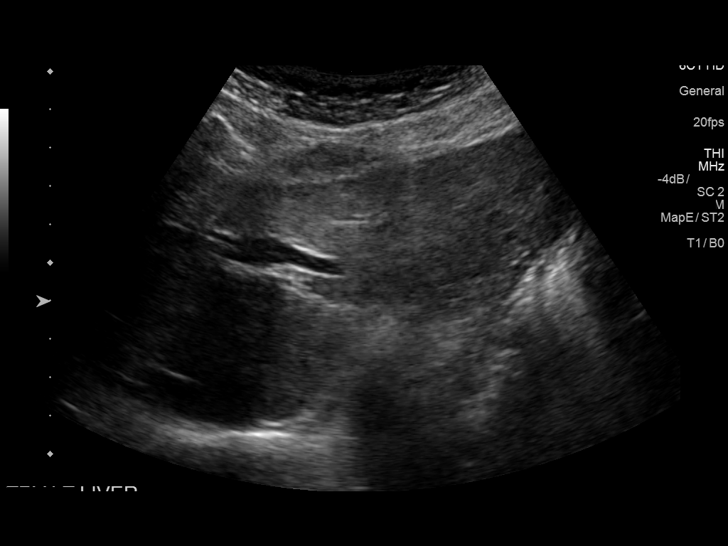

[14 of 25 positions shown; findings below may reference images not displayed]

FINDINGS: Gallbladder:

No gallstones or wall thickening visualized. There is no
pericholecystic fluid. No sonographic Murphy sign noted by
sonographer.

Common bile duct:

Diameter: 3 mm. There is no intrahepatic or extrahepatic biliary
duct dilatation.

Liver:

No focal lesion identified. Overall, liver echogenicity is mildly
increased and inhomogeneous. Flow in the portal vein is in the
anatomic direction.
IMPRESSION: Overall, liver echogenicity is mildly inhomogeneous and increased.
This finding is consistent with hepatic steatosis and/or underlying
parenchymal liver disease. While no focal liver lesions are evident
on this study, it must be cautioned that sensitivity of ultrasound
for detection of focal liver lesions is diminished in this
circumstance. Study otherwise normal.

## 2018-10-18 ENCOUNTER — Ambulatory Visit: Payer: BLUE CROSS/BLUE SHIELD | Admitting: Internal Medicine

## 2018-10-18 ENCOUNTER — Encounter: Payer: Self-pay | Admitting: Internal Medicine

## 2018-10-18 VITALS — BP 114/60 | HR 72 | Temp 97.9°F | Ht 66.0 in | Wt 139.0 lb

## 2018-10-18 DIAGNOSIS — Z0000 Encounter for general adult medical examination without abnormal findings: Secondary | ICD-10-CM

## 2018-10-18 DIAGNOSIS — Z01419 Encounter for gynecological examination (general) (routine) without abnormal findings: Secondary | ICD-10-CM | POA: Diagnosis not present

## 2018-10-18 DIAGNOSIS — M810 Age-related osteoporosis without current pathological fracture: Secondary | ICD-10-CM | POA: Diagnosis not present

## 2018-10-18 DIAGNOSIS — Z1212 Encounter for screening for malignant neoplasm of rectum: Secondary | ICD-10-CM | POA: Diagnosis not present

## 2018-10-18 LAB — POCT URINALYSIS DIPSTICK
Bilirubin, UA: NEGATIVE
Blood, UA: NEGATIVE
Glucose, UA: NEGATIVE
Ketones, UA: NEGATIVE
LEUKOCYTES UA: NEGATIVE
NITRITE UA: NEGATIVE
PH UA: 6 (ref 5.0–8.0)
PROTEIN UA: NEGATIVE
Spec Grav, UA: 1.025 (ref 1.010–1.025)
UROBILINOGEN UA: 1 U/dL

## 2018-10-18 LAB — POC HEMOCCULT BLD/STL (OFFICE/1-CARD/DIAGNOSTIC): Fecal Occult Blood, POC: NEGATIVE

## 2018-10-18 NOTE — Patient Instructions (Signed)

## 2018-10-19 ENCOUNTER — Other Ambulatory Visit: Payer: Self-pay

## 2018-10-19 ENCOUNTER — Other Ambulatory Visit (HOSPITAL_COMMUNITY)
Admission: RE | Admit: 2018-10-19 | Discharge: 2018-10-19 | Disposition: A | Discharge status: Home / Self Care | Payer: BLUE CROSS/BLUE SHIELD | Source: Ambulatory Visit | Attending: Internal Medicine | Admitting: Internal Medicine

## 2018-10-19 DIAGNOSIS — Z01419 Encounter for gynecological examination (general) (routine) without abnormal findings: Secondary | ICD-10-CM

## 2018-10-19 LAB — CMP14+EGFR
ALT: 27 IU/L (ref 0–32)
AST: 35 IU/L (ref 0–40)
Albumin/Globulin Ratio: 1.3 (ref 1.2–2.2)
Albumin: 4.3 g/dL (ref 3.6–4.8)
Alkaline Phosphatase: 69 IU/L (ref 39–117)
BUN/Creatinine Ratio: 13 (ref 12–28)
BUN: 13 mg/dL (ref 8–27)
Bilirubin Total: 0.6 mg/dL (ref 0.0–1.2)
CO2: 23 mmol/L (ref 20–29)
Calcium: 9 mg/dL (ref 8.7–10.3)
Chloride: 101 mmol/L (ref 96–106)
Creatinine, Ser: 0.99 mg/dL (ref 0.57–1.00)
GFR calc Af Amer: 71 mL/min/1.73
GFR calc non Af Amer: 61 mL/min/1.73
Globulin, Total: 3.2 g/dL (ref 1.5–4.5)
Glucose: 101 mg/dL — ABNORMAL HIGH (ref 65–99)
Potassium: 4 mmol/L (ref 3.5–5.2)
Sodium: 137 mmol/L (ref 134–144)
Total Protein: 7.5 g/dL (ref 6.0–8.5)

## 2018-10-19 LAB — CBC
Hematocrit: 32.8 % — ABNORMAL LOW (ref 34.0–46.6)
Hemoglobin: 11.1 g/dL (ref 11.1–15.9)
MCH: 30.5 pg (ref 26.6–33.0)
MCHC: 33.8 g/dL (ref 31.5–35.7)
MCV: 90 fL (ref 79–97)
PLATELETS: 88 10*3/uL — AB (ref 150–450)
RBC: 3.64 x10E6/uL — ABNORMAL LOW (ref 3.77–5.28)
RDW: 11.3 % — AB (ref 11.7–15.4)
WBC: 5.8 10*3/uL (ref 3.4–10.8)

## 2018-10-19 LAB — LIPID PANEL
Chol/HDL Ratio: 3.5 ratio (ref 0.0–4.4)
Cholesterol, Total: 132 mg/dL (ref 100–199)
HDL: 38 mg/dL — ABNORMAL LOW
LDL Calculated: 83 mg/dL (ref 0–99)
Triglycerides: 53 mg/dL (ref 0–149)
VLDL Cholesterol Cal: 11 mg/dL (ref 5–40)

## 2018-10-19 LAB — HEMOGLOBIN A1C
Est. average glucose Bld gHb Est-mCnc: 105 mg/dL
Hgb A1c MFr Bld: 5.3 % (ref 4.8–5.6)

## 2018-10-19 LAB — HIV ANTIBODY (ROUTINE TESTING W REFLEX): HIV SCREEN 4TH GENERATION: NONREACTIVE

## 2018-10-19 NOTE — Progress Notes (Signed)
.  pap

## 2018-10-22 ENCOUNTER — Encounter: Payer: Self-pay | Admitting: Internal Medicine

## 2018-10-22 LAB — CYTOLOGY - PAP: Diagnosis: NEGATIVE

## 2018-10-22 NOTE — Progress Notes (Signed)
Pap smear is neg. hiv negative. Liver and kidney fxn are nl. Platelets are low, this is likely due to liver disease. Needs lab visit in six weeks. Check CBC w/ diff - dx: thrombocytopenia. Your chol looks pretty good, except good chol is low. Needs to increase exercise. She is not prediabetic.

## 2018-10-22 NOTE — Progress Notes (Signed)
Subjective:     Patient ID: Taylor Santiago , female    DOB: 08-11-1956 , 63 y.o.   MRN: [id number]   Chief Complaint  Patient presents with  . Annual Exam    HPI  She is here today for a full physical examination. She is unable to recall her last pap smear. She is willing to have one today.     Past Medical History:  Diagnosis Date  . Abnormal LFTs (liver function tests)   . Cirrhosis ([place])   . Hepatitis C, chronic ([place]) 2004   genotype 1a  . Medical history non-contributory   . Thrombocytopenia ([place])      Family History  Problem Relation Age of Onset  . Hypertension Mother   . Diabetes Mother   . Ovarian cancer Mother   . Heart disease Maternal Grandmother   . Heart disease Maternal Grandfather   . Colon cancer Neg Hx   . Breast cancer Neg Hx      Current Outpatient Medications:  .  acetaminophen (TYLENOL) 500 MG tablet, Take 1 tablet (500 mg total) by mouth every 6 (six) hours as needed., Disp: 30 tablet, Rfl: 0 .  albuterol (PROVENTIL HFA;VENTOLIN HFA) 108 (90 BASE) MCG/ACT inhaler, Inhale 2 puffs into the lungs every 6 (six) hours as needed for wheezing or shortness of breath (rescue)., Disp: , Rfl:  .  fluticasone (FLONASE) 50 MCG/ACT nasal spray, Place 1-2 sprays into both nostrils daily for 7 days., Disp: 1 g, Rfl: 0 .  ibuprofen (ADVIL,MOTRIN) 200 MG tablet, Take 200 mg by mouth every 6 (six) hours as needed., Disp: , Rfl:  .  PROLIA 60 MG/ML SOSY injection, INJECT 60 MG UNDER THE SKIN EVERY 6 MONTHS., Disp: 1 Syringe, Rfl: 1 .  Cetirizine HCl 10 MG CAPS, Take 1 capsule (10 mg total) by mouth daily for 15 days., Disp: 15 capsule, Rfl: 0   Allergies  Allergen Reactions  . Hydrocodone Itching  . Penicillins Hives    Sweats Has patient had a PCN reaction causing immediate rash, facial/tongue/throat swelling, SOB or lightheadedness with hypotension: No Has patient had a PCN reaction causing severe rash involving mucus membranes or skin necrosis:No Has patient  had a PCN reaction that required hospitalization No Has patient had a PCN reaction occurring within the last 10 years: no   If all of the above answers are "NO", then may proceed with Cephalosporin use.   Marland Kitchen Percocet [Oxycodone-Acetaminophen]   . Prednisone Rash     No LMP recorded. Patient has had a hysterectomy..  Negative for: breast discharge, breast lump(s), breast pain and breast self exam. Associated symptoms include abnormal vaginal bleeding. Pertinent negatives include abnormal bleeding (hematology), anxiety, decreased libido, depression, difficulty falling sleep, dyspareunia, history of infertility, nocturia, sexual dysfunction, sleep disturbances, urinary incontinence, urinary urgency, vaginal discharge and vaginal itching. Diet regular.The patient states her exercise level is  minimal.  . The patient's tobacco use is:  Social History   Tobacco Use  Smoking Status Former Smoker  . Years: 20.00  Smokeless Tobacco Never Used  . She has been exposed to passive smoke. The patient's alcohol use is:  Social History   Substance and Sexual Activity  Alcohol Use No   Comment: 6 pack-12 pack a month; as of 11/25/16 "only on holidays"  . Additional information: Last pap - she is unable to recall, next one scheduled for today.   Review of Systems  Constitutional: Negative.   HENT: Negative.   Eyes: Negative.  Respiratory: Negative.   Cardiovascular: Negative.   Gastrointestinal: Negative.   Endocrine: Negative.   Genitourinary: Negative.   Musculoskeletal: Negative.   Skin: Negative.   Allergic/Immunologic: Negative.   Neurological: Negative.   Hematological: Negative.   Psychiatric/Behavioral: Negative.      Today's Vitals   10/18/18 1452  BP: 114/60  Pulse: 72  Temp: 97.9 F (36.6 C)  TempSrc: Oral  Weight: 139 lb (63 kg)  Height: 5' 6"  (1.676 m)   Body mass index is 22.44 kg/m.   Objective:  Physical Exam Vitals signs and nursing note reviewed. Exam conducted  with a chaperone present.  Constitutional:      Appearance: Normal appearance. She is normal weight.  HENT:     Head: Normocephalic and atraumatic.     Right Ear: Tympanic membrane, ear canal and external ear normal.     Left Ear: Tympanic membrane, ear canal and external ear normal.     Nose: Nose normal.     Mouth/Throat:     Mouth: Mucous membranes are moist.     Pharynx: Oropharynx is clear.     Comments: Pos for caries  Eyes:     Extraocular Movements: Extraocular movements intact.     Conjunctiva/sclera: Conjunctivae normal.     Pupils: Pupils are equal, round, and reactive to light.  Neck:     Musculoskeletal: Normal range of motion and neck supple.  Cardiovascular:     Rate and Rhythm: Normal rate and regular rhythm.     Pulses: Normal pulses.     Heart sounds: Normal heart sounds.  Pulmonary:     Effort: Pulmonary effort is normal.     Breath sounds: Normal breath sounds.  Chest:     Breasts:        Right: Normal. No swelling, bleeding, inverted nipple, mass or nipple discharge.        Left: Normal. No swelling, bleeding, inverted nipple, mass or nipple discharge.  Abdominal:     General: Abdomen is flat. Bowel sounds are normal.     Palpations: Abdomen is soft.  Genitourinary:    General: Normal vulva.     Exam position: Lithotomy position.     Vagina: Normal.     Uterus: Absent.      Rectum: Normal. Guaiac result negative.  Skin:    General: Skin is warm and dry.     Capillary Refill: Capillary refill takes less than 2 seconds.  Neurological:     General: No focal deficit present.     Mental Status: She is alert.  Psychiatric:        Mood and Affect: Mood normal.         Assessment And Plan:     1. Routine general medical examination at health care facility  A full exam was performed. Importance of monthly self breast exams was discussed with the patient.  PATIENT HAS BEEN ADVISED TO GET 30-45 MINUTES REGULAR EXERCISE NO LESS THAN FOUR TO FIVE DAYS  PER WEEK - BOTH WEIGHTBEARING EXERCISES AND AEROBIC ARE RECOMMENDED.  SHE IS ADVISED TO FOLLOW A HEALTHY DIET WITH AT LEAST SIX FRUITS/VEGGIES PER DAY, DECREASE INTAKE OF RED MEAT, AND TO INCREASE FISH INTAKE TO TWO DAYS PER WEEK.  MEATS/FISH SHOULD NOT BE FRIED, BAKED OR BROILED IS PREFERABLE.  I SUGGEST WEARING SPF 50 SUNSCREEN ON EXPOSED PARTS AND ESPECIALLY WHEN IN THE DIRECT SUNLIGHT FOR AN EXTENDED PERIOD OF TIME.  PLEASE AVOID FAST FOOD RESTAURANTS AND INCREASE YOUR WATER INTAKE.  -  HIV antibody (with reflex) - CMP14+EGFR - CBC - Lipid panel - Hemoglobin A1c - POC Hemoccult Bld/Stl (1-Cd Office Dx) - POCT Urinalysis Dipstick (81002)  2. Papanicolaou test, as part of routine gynecological examination  Pap smear performed. Stool is heme negative.   - Cytology -Pap Smear  3. Age-related osteoporosis without current pathological fracture  I will refer her for a bone density.  Importance of calcium, vitamin D supplementation was discussed with the patient. She has been getting Prolia every six months. She is okay with getting repeat dexa since last one was 2-3 years ago.   - DG Bone Density; Future        Maximino Greenland, MD

## 2018-11-09 ENCOUNTER — Other Ambulatory Visit: Payer: Self-pay | Admitting: Internal Medicine

## 2018-11-09 DIAGNOSIS — E2839 Other primary ovarian failure: Secondary | ICD-10-CM

## 2018-11-09 DIAGNOSIS — M810 Age-related osteoporosis without current pathological fracture: Secondary | ICD-10-CM

## 2018-12-04 ENCOUNTER — Other Ambulatory Visit: Payer: BLUE CROSS/BLUE SHIELD

## 2018-12-04 DIAGNOSIS — D696 Thrombocytopenia, unspecified: Secondary | ICD-10-CM

## 2018-12-05 LAB — CBC WITH DIFFERENTIAL/PLATELET
BASOS ABS: 0 10*3/uL (ref 0.0–0.2)
BASOS: 1 %
EOS (ABSOLUTE): 0.1 10*3/uL (ref 0.0–0.4)
Eos: 2 %
Hematocrit: 32.4 % — ABNORMAL LOW (ref 34.0–46.6)
Hemoglobin: 11 g/dL — ABNORMAL LOW (ref 11.1–15.9)
IMMATURE GRANS (ABS): 0 10*3/uL (ref 0.0–0.1)
IMMATURE GRANULOCYTES: 0 %
LYMPHS: 61 %
Lymphocytes Absolute: 3.7 10*3/uL — ABNORMAL HIGH (ref 0.7–3.1)
MCH: 30.3 pg (ref 26.6–33.0)
MCHC: 34 g/dL (ref 31.5–35.7)
MCV: 89 fL (ref 79–97)
Monocytes Absolute: 0.5 10*3/uL (ref 0.1–0.9)
Monocytes: 8 %
NEUTROS PCT: 28 %
Neutrophils Absolute: 1.7 10*3/uL (ref 1.4–7.0)
Platelets: 91 10*3/uL — CL (ref 150–450)
RBC: 3.63 x10E6/uL — ABNORMAL LOW (ref 3.77–5.28)
RDW: 11.6 % — AB (ref 11.7–15.4)
WBC: 6 10*3/uL (ref 3.4–10.8)

## 2018-12-11 ENCOUNTER — Other Ambulatory Visit: Payer: Self-pay | Admitting: Nurse Practitioner

## 2018-12-11 DIAGNOSIS — B182 Chronic viral hepatitis C: Secondary | ICD-10-CM

## 2018-12-25 ENCOUNTER — Ambulatory Visit (HOSPITAL_COMMUNITY)
Admission: EM | Admit: 2018-12-25 | Discharge: 2018-12-25 | Disposition: A | Discharge status: Home / Self Care | Payer: BLUE CROSS/BLUE SHIELD | Attending: Family Medicine | Admitting: Family Medicine

## 2018-12-25 ENCOUNTER — Other Ambulatory Visit: Payer: Self-pay

## 2018-12-25 ENCOUNTER — Ambulatory Visit (INDEPENDENT_AMBULATORY_CARE_PROVIDER_SITE_OTHER): Payer: BLUE CROSS/BLUE SHIELD

## 2018-12-25 ENCOUNTER — Encounter (HOSPITAL_COMMUNITY): Payer: Self-pay

## 2018-12-25 DIAGNOSIS — J019 Acute sinusitis, unspecified: Secondary | ICD-10-CM

## 2018-12-25 DIAGNOSIS — R05 Cough: Secondary | ICD-10-CM | POA: Diagnosis not present

## 2018-12-25 MED ORDER — DOXYCYCLINE HYCLATE 100 MG PO CAPS: 100.0000 mg | ORAL_CAPSULE | Freq: Two times a day (BID) | ORAL | 0 refills | Status: AC

## 2018-12-25 MED ORDER — BENZONATATE 200 MG PO CAPS: 200.0000 mg | ORAL_CAPSULE | Freq: Three times a day (TID) | ORAL | 0 refills | Status: AC | PRN

## 2018-12-25 MED ORDER — CETIRIZINE HCL 10 MG PO CAPS: 10.0000 mg | ORAL_CAPSULE | Freq: Every day | ORAL | 0 refills | Status: DC

## 2018-12-25 NOTE — ED Triage Notes (Signed)
Pt presents with productive cough for over a week with no relief with OTC medication.

## 2018-12-25 NOTE — ED Provider Notes (Signed)
[place]    CSN: [id number] Arrival date & time: 12/25/18  1527     History   Chief Complaint Chief Complaint  Patient presents with  . Cough    HPI EDLA PARA is a 63 y.o. female history of chronic hep C, presenting today for evaluation of a cough.  Notes cough to be productive.  Patient states that she has had a cough for approximately 10 days.  She is also had associated nasal congestion, sore throat.  Denies any known measured fevers, but has had subjective fevers and hot and cold chills.  Denies nausea vomiting or diarrhea.  She has tried multiple over-the-counter combination cold and flu medicines including Advil, sore throat intermittent, mild at time of visit. Denies any recent travel, denies any known exposure to COVID-19.    HPI  Past Medical History:  Diagnosis Date  . Abnormal LFTs (liver function tests)   . Cirrhosis ([place])   . Hepatitis C, chronic ([place]) 2004   genotype 1a  . Medical history non-contributory   . Thrombocytopenia [facility])     Patient Active Problem List   Diagnosis Date Noted  . Age-related osteoporosis without current pathological fracture 10/02/2018  . Left hand weakness 05/15/2014  . Numbness and tingling in left arm 05/15/2014  . Hepatitis C, chronic ([place]) 05/15/2014    Past Surgical History:  Procedure Laterality Date  . ABDOMINAL HYSTERECTOMY    . APPENDECTOMY    . COLONOSCOPY  2013    OB History    Gravida  1   Para  1   Term      Preterm      AB      Living  1     SAB      TAB      Ectopic      Multiple      Live Births               Home Medications    Prior to Admission medications   Medication Sig Start Date End Date Taking? Authorizing Provider  acetaminophen (TYLENOL) 500 MG tablet Take 1 tablet (500 mg total) by mouth every 6 (six) hours as needed. 08/19/15   Gloriann Loan, PA-C  albuterol (PROVENTIL HFA;VENTOLIN HFA) 108 (90 BASE) MCG/ACT inhaler Inhale 2 puffs into the lungs  every 6 (six) hours as needed for wheezing or shortness of breath (rescue).    [provider]  benzonatate (TESSALON) 200 MG capsule Take 1 capsule (200 mg total) by mouth 3 (three) times daily as needed for up to 7 days for cough. 12/25/18 01/01/19  Wieters, Hallie C, PA-C  Cetirizine HCl 10 MG CAPS Take 1 capsule (10 mg total) by mouth daily for 10 days. 12/25/18 01/04/19  Wieters, Hallie C, PA-C  doxycycline (VIBRAMYCIN) 100 MG capsule Take 1 capsule (100 mg total) by mouth 2 (two) times daily for 10 days. 12/25/18 01/04/19  Wieters, Hallie C, PA-C  ibuprofen (ADVIL,MOTRIN) 200 MG tablet Take 200 mg by mouth every 6 (six) hours as needed.    [provider]  PROLIA 60 MG/ML SOSY injection INJECT 60 MG UNDER THE SKIN EVERY 6 MONTHS. 08/21/18   [name], FNP    Family History Family History  Problem Relation Age of Onset  . Hypertension Mother   . Diabetes Mother   . Ovarian cancer Mother   . Heart disease Maternal Grandmother   . Heart disease Maternal Grandfather   . Colon cancer Neg Hx   .  Breast cancer Neg Hx     Social History Social History   Tobacco Use  . Smoking status: Former Smoker    Years: 20.00  . Smokeless tobacco: Never Used  Substance Use Topics  . Alcohol use: No    Comment: 6 pack-12 pack a month; as of 11/25/16 "only on holidays"  . Drug use: No     Allergies   Hydrocodone; Penicillins; Percocet [oxycodone-acetaminophen]; and Prednisone   Review of Systems Review of Systems  Constitutional: Negative for activity change, appetite change, chills, fatigue and fever.  HENT: Positive for congestion, rhinorrhea and sore throat. Negative for ear pain, sinus pressure and trouble swallowing.   Eyes: Negative for discharge and redness.  Respiratory: Positive for cough. Negative for chest tightness and shortness of breath.   Cardiovascular: Negative for chest pain.  Gastrointestinal: Negative for abdominal pain, diarrhea, nausea and vomiting.   Musculoskeletal: Negative for myalgias.  Skin: Negative for rash.  Neurological: Positive for headaches. Negative for dizziness and light-headedness.     Physical Exam Triage Vital Signs ED Triage Vitals  Enc Vitals Group     BP 12/25/18 1550 (!) 141/79     Pulse Rate 12/25/18 1550 80     Resp 12/25/18 1550 16     Temp 12/25/18 1550 97.9 F (36.6 C)     Temp Source 12/25/18 1550 Oral     SpO2 12/25/18 1550 97 %     Weight --      Height --      Head Circumference --      Peak Flow --      Pain Score 12/25/18 1551 0     Pain Loc --      Pain Edu? --      Excl. in [place]? --    No data found.  Updated Vital Signs BP (!) 141/79 (BP Location: Left Arm)   Pulse 80   Temp 97.9 F (36.6 C) (Oral)   Resp 16   SpO2 97%   Visual Acuity Right Eye Distance:   Left Eye Distance:   Bilateral Distance:    Right Eye Near:   Left Eye Near:    Bilateral Near:     Physical Exam Vitals signs and nursing note reviewed.  Constitutional:      General: She is not in acute distress.    Appearance: She is well-developed.  HENT:     Head: Normocephalic and atraumatic.     Ears:     Comments: Bilateral ears without tenderness to palpation of external auricle, tragus and mastoid, EAC's without erythema or swelling, TM's with good bony landmarks and cone of light. Non erythematous.     Mouth/Throat:     Comments: Oral mucosa pink and moist, no tonsillar enlargement or exudate. Posterior pharynx patent and nonerythematous, no uvula deviation or swelling. Normal phonation. Eyes:     Conjunctiva/sclera: Conjunctivae normal.  Neck:     Musculoskeletal: Neck supple.  Cardiovascular:     Rate and Rhythm: Normal rate and regular rhythm.     Heart sounds: No murmur.  Pulmonary:     Effort: Pulmonary effort is normal. No respiratory distress.     Breath sounds: Normal breath sounds.     Comments: Breathing comfortably at rest, CTABL, no wheezing, rales or other adventitious sounds  auscultated Abdominal:     Palpations: Abdomen is soft.     Tenderness: There is no abdominal tenderness.  Skin:    General: Skin is warm and dry.  Neurological:     Mental Status: She is alert.      UC Treatments / Results  Labs (all labs ordered are listed, but only abnormal results are displayed) Labs Reviewed - No data to display  EKG None  Radiology Dg Chest 2 View  Result Date: 12/25/2018 CLINICAL DATA:  Cough with chest and nasal congestion for 1 week. EXAM: CHEST - 2 VIEW COMPARISON:  PA and lateral chest 11/03/2016 and 09/07/2016. FINDINGS: The lungs are clear. Heart size is normal. No pneumothorax or pleural fluid. No acute or focal bony abnormality. IMPRESSION: Negative chest. Electronically Signed   By: Inge Rise M.D.   On: 12/25/2018 16:36    Procedures Procedures (including critical care time)  Medications Ordered in UC Medications - No data to display  Initial Impression / Assessment and Plan / UC Course  I have reviewed the triage vital signs and the nursing notes.  Pertinent labs & imaging results that were available during my care of the patient were reviewed by me and considered in my medical decision making (see chart for details).     X-ray negative for pneumonia.  Will treat for sinusitis, will cover with doxycycline as well as to cover atypicals and lungs.  Tessalon for cough, continue symptomatic and supportive care for congestion with Mucinex and antihistamines.  Continue to monitor,Discussed strict return precautions. Patient verbalized understanding and is agreeable with plan.  Final Clinical Impressions(s) / UC Diagnoses   Final diagnoses:  Acute sinusitis with symptoms > 10 days     Discharge Instructions     Chest x-ray did not show pneumonia I will go and treat you for a sinus infection, begin doxycycline twice daily Tessalon as needed for cough every 8 hours For congestion may use Mucinex, daily cetirizine/Zyrtec Rest, push  fluids  Follow-up if symptoms not resolving or worsening, developing increased shortness of breath, difficulty breathing or fevers    ED Prescriptions    Medication Sig Dispense Auth. Provider   doxycycline (VIBRAMYCIN) 100 MG capsule Take 1 capsule (100 mg total) by mouth 2 (two) times daily for 10 days. 20 capsule Wieters, Hallie C, PA-C   benzonatate (TESSALON) 200 MG capsule Take 1 capsule (200 mg total) by mouth 3 (three) times daily as needed for up to 7 days for cough. 28 capsule Wieters, Hallie C, PA-C   Cetirizine HCl 10 MG CAPS Take 1 capsule (10 mg total) by mouth daily for 10 days. 10 capsule Wieters, Hallie C, PA-C     Controlled Substance Prescriptions [place] Controlled Substance Registry consulted? Not Applicable   Janith Lima, [place] 12/25/18 1644

## 2018-12-25 NOTE — Discharge Instructions (Signed)
Chest x-ray did not show pneumonia I will go and treat you for a sinus infection, begin doxycycline twice daily Tessalon as needed for cough every 8 hours For congestion may use Mucinex, daily cetirizine/Zyrtec Rest, push fluids  Follow-up if symptoms not resolving or worsening, developing increased shortness of breath, difficulty breathing or fevers

## 2018-12-27 ENCOUNTER — Other Ambulatory Visit: Payer: Self-pay

## 2018-12-27 ENCOUNTER — Ambulatory Visit
Admission: RE | Admit: 2018-12-27 | Discharge: 2018-12-27 | Disposition: A | Discharge status: Home / Self Care | Payer: BLUE CROSS/BLUE SHIELD | Source: Ambulatory Visit | Attending: Nurse Practitioner | Admitting: Nurse Practitioner

## 2018-12-27 DIAGNOSIS — B182 Chronic viral hepatitis C: Secondary | ICD-10-CM

## 2018-12-31 ENCOUNTER — Other Ambulatory Visit: Payer: Self-pay

## 2018-12-31 MED ORDER — DENOSUMAB 60 MG/ML ~~LOC~~ SOSY: PREFILLED_SYRINGE | SUBCUTANEOUS | 1 refills | Status: DC

## 2019-01-24 ENCOUNTER — Other Ambulatory Visit: Payer: BLUE CROSS/BLUE SHIELD

## 2019-03-15 ENCOUNTER — Other Ambulatory Visit: Payer: Self-pay | Admitting: Internal Medicine

## 2019-03-15 DIAGNOSIS — D696 Thrombocytopenia, unspecified: Secondary | ICD-10-CM

## 2019-04-02 ENCOUNTER — Ambulatory Visit (INDEPENDENT_AMBULATORY_CARE_PROVIDER_SITE_OTHER): Payer: BLUE CROSS/BLUE SHIELD | Admitting: Internal Medicine

## 2019-04-02 ENCOUNTER — Telehealth: Payer: Self-pay

## 2019-04-02 ENCOUNTER — Other Ambulatory Visit: Payer: Self-pay

## 2019-04-02 ENCOUNTER — Ambulatory Visit: Payer: BLUE CROSS/BLUE SHIELD | Admitting: Internal Medicine

## 2019-04-02 ENCOUNTER — Encounter: Payer: Self-pay | Admitting: Internal Medicine

## 2019-04-02 VITALS — BP 108/66 | HR 82 | Temp 98.1°F | Ht 66.0 in | Wt 137.4 lb

## 2019-04-02 DIAGNOSIS — M810 Age-related osteoporosis without current pathological fracture: Secondary | ICD-10-CM | POA: Diagnosis not present

## 2019-04-02 DIAGNOSIS — Z79899 Other long term (current) drug therapy: Secondary | ICD-10-CM

## 2019-04-02 DIAGNOSIS — G43809 Other migraine, not intractable, without status migrainosus: Secondary | ICD-10-CM | POA: Diagnosis not present

## 2019-04-02 DIAGNOSIS — D696 Thrombocytopenia, unspecified: Secondary | ICD-10-CM

## 2019-04-02 MED ORDER — DENOSUMAB 60 MG/ML ~~LOC~~ SOSY
60.0000 mg | PREFILLED_SYRINGE | Freq: Once | SUBCUTANEOUS | Status: AC
  Administered 2019-04-02: 60 mg via SUBCUTANEOUS

## 2019-04-02 NOTE — Patient Instructions (Signed)
Osteoporosis    Osteoporosis happens when your bones get thin and weak. This can cause your bones to break (fracture) more easily. You can do things at home to make your bones stronger.  Follow these instructions at home:    Activity   Exercise as told by your doctor. Ask your doctor what activities are safe for you. You should do:  ? Exercises that make your muscles work to hold your body weight up (weight-bearing exercises). These include tai chi, yoga, and walking.  ? Exercises to make your muscles stronger. One example is lifting weights.  Lifestyle   Limit alcohol intake to no more than 1 drink a day for nonpregnant women and 2 drinks a day for men. One drink equals 12 oz of beer, 5 oz of wine, or 1 oz of hard liquor.   Do not use any products that have nicotine or tobacco in them. These include cigarettes and e-cigarettes. If you need help quitting, ask your doctor.  Preventing falls   Use tools to help you move around (mobility aids) as needed. These include canes, walkers, scooters, and crutches.   Keep rooms well-lit and free of clutter.   Put away things that could make you trip. These include cords and rugs.   Install safety rails on stairs. Install grab bars in bathrooms.   Use rubber mats in slippery areas, like bathrooms.   Wear shoes that:  ? Fit you well.  ? Support your feet.  ? Have closed toes.  ? Have rubber soles or low heels.   Tell your doctor about all of the medicines you are taking. Some medicines can make you more likely to fall.  General instructions   Eat plenty of calcium and vitamin D. These nutrients are good for your bones. Good sources of calcium and vitamin D include:  ? Some fatty fish, such as salmon and tuna.  ? Foods that have calcium and vitamin D added to them (fortified foods). For example, some breakfast cereals are fortified with calcium and vitamin D.  ? Egg yolks.  ? Cheese.  ? Liver.   Take over-the-counter and prescription medicines only as told by your  doctor.   Keep all follow-up visits as told by your doctor. This is important.  Contact a doctor if:   You have not been tested (screened) for osteoporosis and you are:  ? A woman who is age 65 or older.  ? A man who is age 70 or older.  Get help right away if:   You fall.   You get hurt.  Summary   Osteoporosis happens when your bones get thin and weak.   Weak bones can break (fracture) more easily.   Eat plenty of calcium and vitamin D. These nutrients are good for your bones.   Tell your doctor about all of the medicines that you take.  This information is not intended to replace advice given to you by your health care provider. Make sure you discuss any questions you have with your health care provider.  Document Released: 12/19/2011 Document Revised: 07/21/2017 Document Reviewed: 07/21/2017  Elsevier Interactive Patient Education  2019 [employer] Inc[employer]

## 2019-04-02 NOTE — Telephone Encounter (Signed)
The patient was notified that I will place the reorder form for her prolia in the mail for her to sign it and return it.

## 2019-04-03 ENCOUNTER — Other Ambulatory Visit: Payer: BLUE CROSS/BLUE SHIELD

## 2019-04-03 LAB — CBC WITH DIFFERENTIAL/PLATELET
Basophils Absolute: 0 10*3/uL (ref 0.0–0.2)
Basos: 0 %
EOS (ABSOLUTE): 0.1 10*3/uL (ref 0.0–0.4)
Eos: 1 %
Hematocrit: 38.6 % (ref 34.0–46.6)
Hemoglobin: 12.7 g/dL (ref 11.1–15.9)
Immature Grans (Abs): 0 10*3/uL (ref 0.0–0.1)
Immature Granulocytes: 0 %
Lymphocytes Absolute: 3.1 10*3/uL (ref 0.7–3.1)
Lymphs: 57 %
MCH: 30 pg (ref 26.6–33.0)
MCHC: 32.9 g/dL (ref 31.5–35.7)
MCV: 91 fL (ref 79–97)
Monocytes Absolute: 0.4 10*3/uL (ref 0.1–0.9)
Monocytes: 8 %
Neutrophils Absolute: 1.9 10*3/uL (ref 1.4–7.0)
Neutrophils: 34 %
Platelets: 104 10*3/uL — ABNORMAL LOW (ref 150–450)
RBC: 4.24 x10E6/uL (ref 3.77–5.28)
RDW: 11.8 % (ref 11.7–15.4)
WBC: 5.6 10*3/uL (ref 3.4–10.8)

## 2019-04-03 LAB — CMP14+EGFR
ALT: 39 IU/L — ABNORMAL HIGH (ref 0–32)
AST: 43 IU/L — ABNORMAL HIGH (ref 0–40)
Albumin/Globulin Ratio: 1.4 (ref 1.2–2.2)
Albumin: 4.9 g/dL — ABNORMAL HIGH (ref 3.8–4.8)
Alkaline Phosphatase: 66 IU/L (ref 39–117)
BUN/Creatinine Ratio: 14 (ref 12–28)
BUN: 12 mg/dL (ref 8–27)
Bilirubin Total: 0.6 mg/dL (ref 0.0–1.2)
CO2: 22 mmol/L (ref 20–29)
Calcium: 9.7 mg/dL (ref 8.7–10.3)
Chloride: 102 mmol/L (ref 96–106)
Creatinine, Ser: 0.85 mg/dL (ref 0.57–1.00)
GFR calc Af Amer: 84 mL/min/{1.73_m2} (ref >59)
GFR calc non Af Amer: 73 mL/min/{1.73_m2} (ref >59)
Globulin, Total: 3.5 g/dL (ref 1.5–4.5)
Glucose: 106 mg/dL — ABNORMAL HIGH (ref 65–99)
Potassium: 4.7 mmol/L (ref 3.5–5.2)
Sodium: 141 mmol/L (ref 134–144)
Total Protein: 8.4 g/dL (ref 6.0–8.5)

## 2019-04-07 NOTE — Progress Notes (Signed)
Subjective:     Patient ID: Taylor Santiago , female    DOB: [birth year] , 63 y.o.   MRN: [id number]   Chief Complaint  Patient presents with  . Prolia injection  . Migraine    HPI  She is here today for her Prolia injection. She admits that she has not been taking calcium regularly since she was overdue for her injection.   Migraine  This is a recurrent problem. The current episode started 1 to 4 weeks ago. The problem occurs intermittently. The problem has been gradually improving. The pain is located in the bilateral region. The pain does not radiate. The quality of the pain is described as aching. She has tried acetaminophen for the symptoms. The treatment provided mild relief. Her past medical history is significant for migraine headaches.     Past Medical History:  Diagnosis Date  . Abnormal LFTs (liver function tests)   . Cirrhosis ([place])   . Hepatitis C, chronic ([place]) 2004   genotype 1a  . Medical history non-contributory   . Thrombocytopenia ([place])      Family History  Problem Relation Age of Onset  . Hypertension Mother   . Diabetes Mother   . Ovarian cancer Mother   . Heart disease Maternal Grandmother   . Heart disease Maternal Grandfather   . Colon cancer Neg Hx   . Breast cancer Neg Hx      Current Outpatient Medications:  .  albuterol (PROVENTIL HFA;VENTOLIN HFA) 108 (90 BASE) MCG/ACT inhaler, Inhale 2 puffs into the lungs every 6 (six) hours as needed for wheezing or shortness of breath (rescue)., Disp: , Rfl:  .  denosumab (PROLIA) 60 MG/ML SOSY injection, INJECT 60 MG UNDER THE SKIN EVERY 6 MONTHS., Disp: 1 Syringe, Rfl: 1 .  diphenhydrAMINE HCl (ALLERGY MED PO), Take by mouth., Disp: , Rfl:  .  fluticasone (FLONASE ALLERGY RELIEF) 50 MCG/ACT nasal spray, Place into both nostrils daily., Disp: , Rfl:  .  ibuprofen (ADVIL,MOTRIN) 200 MG tablet, Take 200 mg by mouth every 6 (six) hours as needed., Disp: , Rfl:  .  acetaminophen (TYLENOL) 500 MG tablet, Take  1 tablet (500 mg total) by mouth every 6 (six) hours as needed. (Patient not taking: Reported on 04/02/2019), Disp: 30 tablet, Rfl: 0   Allergies  Allergen Reactions  . Hydrocodone Itching  . Penicillins Hives    Sweats Has patient had a PCN reaction causing immediate rash, facial/tongue/throat swelling, SOB or lightheadedness with hypotension: No Has patient had a PCN reaction causing severe rash involving mucus membranes or skin necrosis:No Has patient had a PCN reaction that required hospitalization No Has patient had a PCN reaction occurring within the last 10 years: no   If all of the above answers are "NO", then may proceed with Cephalosporin use.   Marland Kitchen Percocet [Oxycodone-Acetaminophen]   . Prednisone Rash     Review of Systems  Constitutional: Negative.   Respiratory: Negative.   Cardiovascular: Negative.   Gastrointestinal: Negative.   Neurological: Positive for headaches.  Psychiatric/Behavioral: Negative.      Today's Vitals   04/02/19 1017  BP: 108/66  Pulse: 82  Temp: 98.1 F (36.7 C)  TempSrc: Oral  Weight: 137 lb 6.4 oz (62.3 kg)  Height: 5' 6"  (1.676 m)  PainSc: 0-No pain   Body mass index is 22.18 kg/m.   Objective:  Physical Exam Vitals signs and nursing note reviewed.  Constitutional:      Appearance: Normal appearance.  HENT:  Head: Normocephalic and atraumatic.  Cardiovascular:     Rate and Rhythm: Normal rate and regular rhythm.     Heart sounds: Normal heart sounds.  Pulmonary:     Effort: Pulmonary effort is normal.     Breath sounds: Normal breath sounds.  Skin:    General: Skin is warm.  Neurological:     General: No focal deficit present.     Mental Status: She is alert.  Psychiatric:        Mood and Affect: Mood normal.        Behavior: Behavior normal.         Assessment And Plan:     1. Osteoporosis, unspecified osteoporosis type, unspecified pathological fracture presence  I will check labs as listed below. Importance  of calcium and vitamin D supplementation was reviewed with the patient. She is also encouraged to receive Prolia every six months and walk for 30 minutes three days weekly.   - CMP14+EGFR - denosumab (PROLIA) injection 60 mg  2. Other migraine without status migrainosus, not intractable  Chronic. She is advised to take magnesium, 250-464m nightly. She will let me know if her sx persist.   3. [place]ocyt[place]  Chronic. Likely related to her chronic liver disease. She has been referred to Hematology for further evaluation. She reports having an upcoming appt.   - CBC with Diff  4. Drug therapy   RMaximino Greenland MD    THE PATIENT IS ENCOURAGED TO PRACTICE SOCIAL DISTANCING DUE TO THE COVID-19 PANDEMIC.

## 2019-04-25 ENCOUNTER — Other Ambulatory Visit: Payer: Self-pay

## 2019-04-25 DIAGNOSIS — E2839 Other primary ovarian failure: Secondary | ICD-10-CM

## 2019-06-10 ENCOUNTER — Other Ambulatory Visit (HOSPITAL_COMMUNITY): Payer: Self-pay | Admitting: Nurse Practitioner

## 2019-06-10 DIAGNOSIS — D696 Thrombocytopenia, unspecified: Secondary | ICD-10-CM

## 2019-06-10 DIAGNOSIS — R748 Abnormal levels of other serum enzymes: Secondary | ICD-10-CM

## 2019-06-10 DIAGNOSIS — K7400 Hepatic fibrosis, unspecified: Secondary | ICD-10-CM

## 2019-06-14 ENCOUNTER — Other Ambulatory Visit: Payer: Self-pay | Admitting: Radiology

## 2019-06-18 ENCOUNTER — Other Ambulatory Visit (HOSPITAL_COMMUNITY): Payer: BLUE CROSS/BLUE SHIELD

## 2019-06-18 ENCOUNTER — Ambulatory Visit (HOSPITAL_COMMUNITY): Payer: BLUE CROSS/BLUE SHIELD

## 2019-07-08 ENCOUNTER — Other Ambulatory Visit: Payer: Self-pay | Admitting: Radiology

## 2019-07-09 ENCOUNTER — Ambulatory Visit (HOSPITAL_COMMUNITY)
Admission: RE | Admit: 2019-07-09 | Discharge: 2019-07-09 | Disposition: A | Discharge status: Home / Self Care | Payer: BLUE CROSS/BLUE SHIELD | Source: Ambulatory Visit | Attending: Nurse Practitioner | Admitting: Nurse Practitioner

## 2019-07-09 ENCOUNTER — Other Ambulatory Visit (HOSPITAL_COMMUNITY): Payer: Self-pay | Admitting: Nurse Practitioner

## 2019-07-09 ENCOUNTER — Encounter (HOSPITAL_COMMUNITY): Payer: Self-pay

## 2019-07-09 ENCOUNTER — Other Ambulatory Visit: Payer: Self-pay

## 2019-07-09 DIAGNOSIS — Z880 Allergy status to penicillin: Secondary | ICD-10-CM | POA: Insufficient documentation

## 2019-07-09 DIAGNOSIS — Z888 Allergy status to other drugs, medicaments and biological substances status: Secondary | ICD-10-CM | POA: Insufficient documentation

## 2019-07-09 DIAGNOSIS — D696 Thrombocytopenia, unspecified: Secondary | ICD-10-CM | POA: Diagnosis not present

## 2019-07-09 DIAGNOSIS — Z885 Allergy status to narcotic agent status: Secondary | ICD-10-CM | POA: Insufficient documentation

## 2019-07-09 DIAGNOSIS — K7400 Hepatic fibrosis, unspecified: Secondary | ICD-10-CM

## 2019-07-09 DIAGNOSIS — R748 Abnormal levels of other serum enzymes: Secondary | ICD-10-CM | POA: Diagnosis not present

## 2019-07-09 DIAGNOSIS — K7460 Unspecified cirrhosis of liver: Secondary | ICD-10-CM | POA: Diagnosis not present

## 2019-07-09 HISTORY — PX: IR VENOGRAM HEPATIC W HEMODYNAMIC EVALUATION: IMG692

## 2019-07-09 HISTORY — PX: IR US GUIDE VASC ACCESS RIGHT: IMG2390

## 2019-07-09 HISTORY — PX: IR TRANSCATHETER BX: IMG713

## 2019-07-09 LAB — CBC WITH DIFFERENTIAL/PLATELET
Abs Immature Granulocytes: 0 10*3/uL (ref 0.00–0.07)
Basophils Absolute: 0 10*3/uL (ref 0.0–0.1)
Basophils Relative: 0 %
Eosinophils Absolute: 0.1 10*3/uL (ref 0.0–0.5)
Eosinophils Relative: 1 %
HCT: 35 % — ABNORMAL LOW (ref 36.0–46.0)
Hemoglobin: 11.3 g/dL — ABNORMAL LOW (ref 12.0–15.0)
Immature Granulocytes: 0 %
Lymphocytes Relative: 57 %
Lymphs Abs: 2.4 10*3/uL (ref 0.7–4.0)
MCH: 31 pg (ref 26.0–34.0)
MCHC: 32.3 g/dL (ref 30.0–36.0)
MCV: 96.2 fL (ref 80.0–100.0)
Monocytes Absolute: 0.4 10*3/uL (ref 0.1–1.0)
Monocytes Relative: 9 %
Neutro Abs: 1.4 10*3/uL — ABNORMAL LOW (ref 1.7–7.7)
Neutrophils Relative %: 33 %
Platelets: 77 10*3/uL — ABNORMAL LOW (ref 150–400)
RBC: 3.64 MIL/uL — ABNORMAL LOW (ref 3.87–5.11)
RDW: 13.4 % (ref 11.5–15.5)
WBC: 4.3 10*3/uL (ref 4.0–10.5)
nRBC: 0 % (ref 0.0–0.2)

## 2019-07-09 LAB — COMPREHENSIVE METABOLIC PANEL
ALT: 65 U/L — ABNORMAL HIGH (ref 0–44)
AST: 67 U/L — ABNORMAL HIGH (ref 15–41)
Albumin: 4 g/dL (ref 3.5–5.0)
Alkaline Phosphatase: 54 U/L (ref 38–126)
Anion gap: 9 (ref 5–15)
BUN: 12 mg/dL (ref 8–23)
CO2: 22 mmol/L (ref 22–32)
Calcium: 8.7 mg/dL — ABNORMAL LOW (ref 8.9–10.3)
Chloride: 106 mmol/L (ref 98–111)
Creatinine, Ser: 0.74 mg/dL (ref 0.44–1.00)
GFR calc Af Amer: >60 mL/min (ref >60)
GFR calc non Af Amer: >60 mL/min (ref >60)
Glucose, Bld: 107 mg/dL — ABNORMAL HIGH (ref 70–99)
Potassium: 3.8 mmol/L (ref 3.5–5.1)
Sodium: 137 mmol/L (ref 135–145)
Total Bilirubin: 1.2 mg/dL (ref 0.3–1.2)
Total Protein: 7.7 g/dL (ref 6.5–8.1)

## 2019-07-09 LAB — PROTIME-INR
INR: 1.1 (ref 0.8–1.2)
Prothrombin Time: 14 seconds (ref 11.4–15.2)

## 2019-07-09 MED ORDER — LIDOCAINE HCL 1 % IJ SOLN
INTRAMUSCULAR | Status: AC
  Filled 2019-07-09: qty 20

## 2019-07-09 MED ORDER — FENTANYL CITRATE (PF) 100 MCG/2ML IJ SOLN
INTRAMUSCULAR | Status: AC
  Filled 2019-07-09: qty 2

## 2019-07-09 MED ORDER — SODIUM CHLORIDE 0.9 % IV SOLN
INTRAVENOUS | Status: DC
  Administered 2019-07-09: 12:00:00 via INTRAVENOUS

## 2019-07-09 MED ORDER — FENTANYL CITRATE (PF) 100 MCG/2ML IJ SOLN
INTRAMUSCULAR | Status: AC | PRN
  Administered 2019-07-09 (×2): 50 ug via INTRAVENOUS

## 2019-07-09 MED ORDER — IOHEXOL 300 MG/ML  SOLN
50.0000 mL | Freq: Once | INTRAMUSCULAR | Status: AC | PRN
  Administered 2019-07-09: 15:00:00 20 mL via INTRAVENOUS

## 2019-07-09 MED ORDER — MIDAZOLAM HCL 2 MG/2ML IJ SOLN
INTRAMUSCULAR | Status: AC | PRN
  Administered 2019-07-09 (×3): 1 mg via INTRAVENOUS

## 2019-07-09 MED ORDER — MIDAZOLAM HCL 2 MG/2ML IJ SOLN
INTRAMUSCULAR | Status: AC
  Filled 2019-07-09: qty 4

## 2019-07-09 MED ORDER — LIDOCAINE HCL (PF) 1 % IJ SOLN
INTRAMUSCULAR | Status: AC | PRN
  Administered 2019-07-09: 5 mL

## 2019-07-09 NOTE — Procedures (Signed)
Hep c, hepatic fibrosis  S/p TJLBX and pressure measurements  No comps Stable ebl min  Path pending Full report in pacs

## 2019-07-09 NOTE — H&P (Signed)
Referring Physician(s): Drazek,Dawn  Supervising Physician: Daryll Brod  Patient Status:  [facility] OP  Chief Complaint: "I'm having a liver biopsy"   Subjective: Patient familiar to IR service from random liver biopsies in 2003 and 2008.  She has a history of hepatitis C, hepatic fibrosis , elevated liver function tests and thrombocytopenia.  She presents today for transjugular liver biopsy for further evaluation/HVPG measurements.  She currently denies fever, headache, chest pain, back pain, nausea, vomiting or bleeding. She does have some dyspnea with exertion , occasional cough and minimal right upper quadrant discomfort.  Past Medical History:  Diagnosis Date  . Abnormal LFTs (liver function tests)   . Cirrhosis ([place])   . Hepatitis C, chronic ([place]) 2004   genotype 1a  . Medical history non-contributory   . Thrombocytopenia ([place])    Past Surgical History:  Procedure Laterality Date  . ABDOMINAL HYSTERECTOMY    . APPENDECTOMY    . COLONOSCOPY  2013      Allergies: Hydrocodone, Penicillins, Percocet [oxycodone-acetaminophen], and Prednisone  Medications: Prior to Admission medications   Medication Sig Start Date End Date Taking? Authorizing Provider  ibuprofen (ADVIL,MOTRIN) 200 MG tablet Take 200 mg by mouth every 6 (six) hours as needed.   Yes [provider]  acetaminophen (TYLENOL) 500 MG tablet Take 1 tablet (500 mg total) by mouth every 6 (six) hours as needed. Patient not taking: Reported on 04/02/2019 08/19/15   Gloriann Loan, PA-C  albuterol (PROVENTIL HFA;VENTOLIN HFA) 108 (90 BASE) MCG/ACT inhaler Inhale 2 puffs into the lungs every 6 (six) hours as needed for wheezing or shortness of breath (rescue).    [provider]  denosumab (PROLIA) 60 MG/ML SOSY injection INJECT 60 MG UNDER THE SKIN EVERY 6 MONTHS. 12/31/18   [name], FNP  diphenhydrAMINE HCl (ALLERGY MED PO) Take by mouth.    [provider]  fluticasone (FLONASE ALLERGY  RELIEF) 50 MCG/ACT nasal spray Place into both nostrils daily.    [provider]     Vital Signs: Blood pressure 146/82, temp 98.6, heart rate 58, respirations 16, O2 sat 100% room air   Physical Exam awake, alert.  Chest clear to auscultation bilaterally.  Heart with slightly bradycardic but regular rhythm.  Abdomen soft, positive bowel sounds, mildly tender RUQ to palpation.  No lower extremity edema.  Imaging: No results found.  Labs:  CBC: Recent Labs    10/18/18 1549 12/04/18 1605 04/02/19 1247  WBC 5.8 6.0 5.6  HGB 11.1 11.0* 12.7  HCT 32.8* 32.4* 38.6  PLT 88* 91* 104*    COAGS: No results for input(s): INR, APTT in the last 8760 hours.  BMP: Recent Labs    10/18/18 1549 04/02/19 1247  NA 137 141  K 4.0 4.7  CL 101 102  CO2 23 22  GLUCOSE 101* 106*  BUN 13 12  CALCIUM 9.0 9.7  CREATININE 0.99 0.85  GFRNONAA 61 73  GFRAA 71 84    LIVER FUNCTION TESTS: Recent Labs    10/18/18 1549 04/02/19 1247  BILITOT 0.6 0.6  AST 35 43*  ALT 27 39*  ALKPHOS 69 66  PROT 7.5 8.4  ALBUMIN 4.3 4.9*    Assessment and Plan: Pt with history of hepatitis C, hepatic fibrosis , elevated liver function tests and thrombocytopenia.  She presents today for transjugular liver biopsy for further evaluation/HVPG measurements.Risks and benefits of procedure was discussed with the patient  including, but not limited to bleeding, infection, damage to adjacent structures or  low yield requiring additional tests.  All of the questions were answered and there is agreement to proceed.  Consent signed and in chart.  LABS PENDING   Electronically Signed: D. Rowe Robert, PA-C 07/09/2019, 12:18 PM   I spent a total of 25 minutes at the the patient's bedside AND on the patient's hospital floor or unit, greater than 50% of which was counseling/coordinating care for transjugular liver biopsy

## 2019-07-09 NOTE — Discharge Instructions (Signed)
Liver Biopsy, Care After °These instructions give you information on caring for yourself after your procedure. Your doctor may also give you more specific instructions. Call your doctor if you have any problems or questions after your procedure. °What can I expect after the procedure? °After the procedure, it is common to have: °· Pain and soreness where the biopsy was done. °· Bruising around the area where the biopsy was done. °· Sleepiness and be tired for a few days. °Follow these instructions at home: °Medicines °· Take over-the-counter and prescription medicines only as told by your doctor. °· If you were prescribed an antibiotic medicine, take it as told by your doctor. Do not stop taking the antibiotic even if you start to feel better. °· Do not take medicines such as aspirin and ibuprofen. These medicines can thin your blood. Do not take these medicines unless your doctor tells you to take them. °· If you are taking prescription pain medicine, take actions to prevent or treat constipation. Your doctor may recommend that you: °? Drink enough fluid to keep your pee (urine) clear or pale yellow. °? Take over-the-counter or prescription medicines. °? Eat foods that are high in fiber, such as fresh fruits and vegetables, whole grains, and beans. °? Limit foods that are high in fat and processed sugars, such as fried and sweet foods. °Caring for your cut °· Follow instructions from your doctor about how to take care of your cuts from surgery (incisions). Make sure you: °? Wash your hands with soap and water before you change your bandage (dressing). If you cannot use soap and water, use hand sanitizer. °? Change your bandage as told by your doctor. °? Leave stitches (sutures), skin glue, or skin tape (adhesive) strips in place. They may need to stay in place for 2 weeks or longer. If tape strips get loose and curl up, you may trim the loose edges. Do not remove tape strips completely unless your doctor says it is  okay. °· Check your cuts every day for signs of infection. Check for: °? Redness, swelling, or more pain. °? Fluid or blood. °? Pus or a bad smell. °? Warmth. °· Do not take baths, swim, or use a hot tub until your doctor says it is okay to do so. °Activity ° °· Rest at home for 1-2 days or as told by your doctor. °? Avoid sitting for a long time without moving. Get up to take short walks every 1-2 hours. °· Return to your normal activities as told by your doctor. Ask what activities are safe for you. °· Do not do these things in the first 24 hours: °? Drive. °? Use machinery. °? Take a bath or shower. °· Do not lift more than 10 pounds (4.5 kg) or play contact sports for the first 2 weeks. °General instructions ° °· Do not drink alcohol in the first week after the procedure. °· Have someone stay with you for at least 24 hours after the procedure. °· Get your test results. Ask your doctor or the department that is doing the test: °? When will my results be ready? °? How will I get my results? °? What are my treatment options? °? What other tests do I need? °? What are my next steps? °· Keep all follow-up visits as told by your doctor. This is important. °Contact a doctor if: °· A cut bleeds and leaves more than just a small spot of blood. °· A cut is red, puffs up (  swells), or hurts more than before. °· Fluid or something else comes from a cut. °· A cut smells bad. °· You have a fever or chills. °Get help right away if: °· You have swelling, bloating, or pain in your belly (abdomen). °· You get dizzy or faint. °· You have a rash. °· You feel sick to your stomach (nauseous) or throw up (vomit). °· You have trouble breathing, feel short of breath, or feel faint. °· Your chest hurts. °· You have problems talking or seeing. °· You have trouble with your balance or moving your arms or legs. °Summary °· After the procedure, it is common to have pain, soreness, bruising, and tiredness. °· Your doctor will tell you how to  take care of yourself at home. Change your bandage, take your medicines, and limit your activities as told by your doctor. °· Call your doctor if you have symptoms of infection. Get help right away if your belly swells, your cut bleeds a lot, or you have trouble talking or breathing. °This information is not intended to replace advice given to you by your health care provider. Make sure you discuss any questions you have with your health care provider. °Document Released: 07/05/2008 Document Revised: 10/06/2017 Document Reviewed: 10/06/2017 °Elsevier Patient Education © 2020 [employer] °Moderate Conscious Sedation, Adult, Care After °These instructions provide you with information about caring for yourself after your procedure. Your health care provider may also give you more specific instructions. Your treatment has been planned according to current medical practices, but problems sometimes occur. Call your health care provider if you have any problems or questions after your procedure. °What can I expect after the procedure? °After your procedure, it is common: °· To feel sleepy for several hours. °· To feel clumsy and have poor balance for several hours. °· To have poor judgment for several hours. °· To vomit if you eat too soon. °Follow these instructions at home: °For at least 24 hours after the procedure: ° °· Do not: °? Participate in activities where you could fall or become injured. °? Drive. °? Use heavy machinery. °? Drink alcohol. °? Take sleeping pills or medicines that cause drowsiness. °? Make important decisions or sign legal documents. °? Take care of children on your own. °· Rest. °Eating and drinking °· Follow the diet recommended by your health care provider. °· If you vomit: °? Drink water, juice, or soup when you can drink without vomiting. °? Make sure you have little or no nausea before eating solid foods. °General instructions °· Have a responsible adult stay with you until you are awake  and alert. °· Take over-the-counter and prescription medicines only as told by your health care provider. °· If you smoke, do not smoke without supervision. °· Keep all follow-up visits as told by your health care provider. This is important. °Contact a health care provider if: °· You keep feeling nauseous or you keep vomiting. °· You feel light-headed. °· You develop a rash. °· You have a fever. °Get help right away if: °· You have trouble breathing. °This information is not intended to replace advice given to you by your health care provider. Make sure you discuss any questions you have with your health care provider. °Document Released: 07/17/2013 Document Revised: 09/08/2017 Document Reviewed: 01/16/2016 °Elsevier Patient Education © 2020 [employer] ° °

## 2019-07-11 ENCOUNTER — Other Ambulatory Visit: Payer: Self-pay

## 2019-07-17 LAB — SURGICAL PATHOLOGY

## 2019-10-02 ENCOUNTER — Ambulatory Visit: Payer: BLUE CROSS/BLUE SHIELD | Admitting: Internal Medicine

## 2019-10-21 ENCOUNTER — Telehealth: Payer: Self-pay

## 2019-10-21 NOTE — Telephone Encounter (Signed)
The pt left a message to cancel her upcoming appt and that she is no longer a patient because her insurance isn't in network.

## 2019-10-23 ENCOUNTER — Encounter: Payer: BLUE CROSS/BLUE SHIELD | Admitting: Internal Medicine

## 2019-12-13 ENCOUNTER — Other Ambulatory Visit: Payer: Self-pay | Admitting: Nurse Practitioner

## 2019-12-13 DIAGNOSIS — K7469 Other cirrhosis of liver: Secondary | ICD-10-CM

## 2019-12-19 ENCOUNTER — Ambulatory Visit
Admission: RE | Admit: 2019-12-19 | Discharge: 2019-12-19 | Disposition: A | Discharge status: Home / Self Care | Payer: BLUE CROSS/BLUE SHIELD | Source: Ambulatory Visit | Attending: Nurse Practitioner | Admitting: Nurse Practitioner

## 2019-12-19 DIAGNOSIS — K7469 Other cirrhosis of liver: Secondary | ICD-10-CM

## 2020-10-19 ENCOUNTER — Encounter (HOSPITAL_COMMUNITY): Payer: Self-pay

## 2020-10-19 ENCOUNTER — Other Ambulatory Visit: Payer: Self-pay

## 2020-10-19 ENCOUNTER — Ambulatory Visit (HOSPITAL_COMMUNITY)
Admission: EM | Admit: 2020-10-19 | Discharge: 2020-10-19 | Disposition: A | Discharge status: Home / Self Care | Payer: BLUE CROSS/BLUE SHIELD | Attending: Student | Admitting: Student

## 2020-10-19 DIAGNOSIS — J069 Acute upper respiratory infection, unspecified: Secondary | ICD-10-CM | POA: Insufficient documentation

## 2020-10-19 DIAGNOSIS — K120 Recurrent oral aphthae: Secondary | ICD-10-CM | POA: Diagnosis not present

## 2020-10-19 DIAGNOSIS — Z20822 Contact with and (suspected) exposure to covid-19: Secondary | ICD-10-CM | POA: Diagnosis not present

## 2020-10-19 MED ORDER — PROMETHAZINE-DM 6.25-15 MG/5ML PO SYRP: 5.0000 mL | ORAL_SOLUTION | Freq: Four times a day (QID) | ORAL | 0 refills | Status: DC | PRN

## 2020-10-19 MED ORDER — LIDOCAINE VISCOUS HCL 2 % MT SOLN: 15.0000 mL | OROMUCOSAL | 0 refills | Status: DC | PRN

## 2020-10-19 MED ORDER — BENZONATATE 100 MG PO CAPS: 100.0000 mg | ORAL_CAPSULE | Freq: Three times a day (TID) | ORAL | 0 refills | Status: DC

## 2020-10-19 NOTE — Discharge Instructions (Addendum)
-  For canker sores/sore throat, use lidocaine mouthwash up to every 4 hours. Make sure not to eat for at least 1 hour after using this, as your mouth will be very numb and you could bite yourself. -Promethazine DM cough syrup for congestion/cough. This could make you drowsy, so take at night before bed. -Tessalon as needed for cough. Take one pill up to 3x daily (every 8 hours) -For fevers/chills, body aches, headaches- use Tylenol and Ibuprofen. You can alternate these for maximum effect. Use up to 3000mg  Tylenol daily and 3200mg  Ibuprofen daily. Make sure to take ibuprofen with food. Check the bottle of ibuprofen/tylenol for specific dosage instructions.  We are currently awaiting result of your PCR covid-19 test. This typically comes back in 1-2 days. We'll call you if the result is positive. Otherwise, the result will be sent electronically to your MyChart. You can also call this clinic and ask for your result via telephone.   Please isolate at home while awaiting these results. If your test is positive for Covid-19, continue to isolate at home for 5 days if you have mild symptoms, or a total of 10 days from symptom onset if you have more severe symptoms. If you quarantine for a shorter period of time (i.e. 5 days), make sure to wear a mask until day 10 of symptoms. Treat your symptoms at home with OTC remedies like tylenol/ibuprofen, mucinex, nyquil, etc. Seek medical attention if you develop high fevers, chest pain, shortness of breath, ear pain, facial pain, etc. Make sure to get up and move around every 2-3 hours while convalescing to help prevent blood clots. Drink plenty of fluids, and rest as much as possible.

## 2020-10-19 NOTE — ED Provider Notes (Signed)
[place]    CSN: [id number] Arrival date & time: 10/19/20  1016      History   Chief Complaint Chief Complaint  Patient presents with  . Sore Throat    Since friday  . Generalized Body Aches    Since friday    HPI Taylor Santiago is a 65 y.o. female Presenting for URI symptoms for 4 days. History of cirrhosis, hepatitis C, thrombocytopenia. Endorses generalized body aches, cough, sore throat, canker sores on gums. States the spots on her gums hurt. Denies fevers/chills, n/v/d, shortness of breath, chest pain, congestion, facial pain, teeth pain, headaches,loss of taste/smell, swollen lymph nodes, ear pain. Denies chest pain, shortness of breath, confusion, high fevers. Fully vaccinated for covid-19.   HPI  Past Medical History:  Diagnosis Date  . Abnormal LFTs (liver function tests)   . Cirrhosis ([place])   . Hepatitis C, chronic ([place]) 2004   genotype 1a  . Medical history non-contributory   . Thrombocytopenia [facility])     Patient Active Problem List   Diagnosis Date Noted  . Age-related osteoporosis without current pathological fracture 10/02/2018  . Left hand weakness 05/15/2014  . Numbness and tingling in left arm 05/15/2014  . Hepatitis C, chronic ([place]) 05/15/2014    Past Surgical History:  Procedure Laterality Date  . ABDOMINAL HYSTERECTOMY    . APPENDECTOMY    . COLONOSCOPY  2013  . IR TRANSCATHETER BX  07/09/2019  . IR US GUIDE VASC ACCESS RIGHT  07/09/2019  . IR VENOGRAM HEPATIC W HEMODYNAMIC EVALUATION  07/09/2019    OB History    Gravida  1   Para  1   Term      Preterm      AB      Living  1     SAB      IAB      Ectopic      Multiple      Live Births               Home Medications    Prior to Admission medications   Medication Sig Start Date End Date Taking? Authorizing Provider  acetaminophen (TYLENOL) 500 MG tablet Take 1 tablet (500 mg total) by mouth every 6 (six) hours as needed. 08/19/15  Yes Gloriann Loan,  PA-C  benzonatate (TESSALON) 100 MG capsule Take 1 capsule (100 mg total) by mouth every 8 (eight) hours. 10/19/20  Yes Hazel Sams, PA-C  diphenhydrAMINE HCl (ALLERGY MED PO) Take by mouth.   Yes [provider]  fluticasone (FLONASE) 50 MCG/ACT nasal spray Place into both nostrils daily.   Yes [provider]  ibuprofen (ADVIL,MOTRIN) 200 MG tablet Take 200 mg by mouth every 6 (six) hours as needed.   Yes [provider]  lidocaine (XYLOCAINE) 2 % solution Use as directed 15 mLs in the mouth or throat as needed for mouth pain. 10/19/20  Yes Hazel Sams, PA-C  promethazine-dextromethorphan (PROMETHAZINE-DM) 6.25-15 MG/5ML syrup Take 5 mLs by mouth 4 (four) times daily as needed for cough. 10/19/20  Yes Hazel Sams, PA-C  albuterol (PROVENTIL HFA;VENTOLIN HFA) 108 (90 BASE) MCG/ACT inhaler Inhale 2 puffs into the lungs every 6 (six) hours as needed for wheezing or shortness of breath (rescue).    [provider]  denosumab (PROLIA) 60 MG/ML SOSY injection INJECT 60 MG UNDER THE SKIN EVERY 6 MONTHS. 12/31/18   [name], FNP    Family History Family History  Problem Relation  Age of Onset  . Hypertension Mother   . Diabetes Mother   . Ovarian cancer Mother   . Heart disease Maternal Grandmother   . Heart disease Maternal Grandfather   . Colon cancer Neg Hx   . Breast cancer Neg Hx     Social History Social History   Tobacco Use  . Smoking status: Former Smoker    Years: 20.00  . Smokeless tobacco: Never Used  Vaping Use  . Vaping Use: Never used  Substance Use Topics  . Alcohol use: No    Comment: 6 pack-12 pack a month; as of 11/25/16 "only on holidays"  . Drug use: No     Allergies   Hydrocodone, Penicillins, Percocet [oxycodone-acetaminophen], and Prednisone   Review of Systems Review of Systems  Constitutional: Negative for appetite change, chills and fever.  HENT: Positive for sore throat. Negative for congestion, ear  pain, rhinorrhea, sinus pressure and sinus pain.        Sores on gums  Eyes: Negative for redness and visual disturbance.  Respiratory: Negative for cough, chest tightness, shortness of breath and wheezing.   Cardiovascular: Negative for chest pain and palpitations.  Gastrointestinal: Negative for abdominal pain, constipation, diarrhea, nausea and vomiting.  Genitourinary: Negative for dysuria, frequency and urgency.  Musculoskeletal: Positive for myalgias.  Neurological: Negative for dizziness, weakness and headaches.  Psychiatric/Behavioral: Negative for confusion.  All other systems reviewed and are negative.    Physical Exam Triage Vital Signs ED Triage Vitals  Enc Vitals Group     BP 10/19/20 1242 (!) 156/75     Pulse Rate 10/19/20 1242 73     Resp 10/19/20 1242 17     Temp 10/19/20 1242 97.8 F (36.6 C)     Temp Source 10/19/20 1242 Oral     SpO2 10/19/20 1242 99 %     Weight --      Height --      Head Circumference --      Peak Flow --      Pain Score 10/19/20 1245 9     Pain Loc --      Pain Edu? --      Excl. in [place]? --    No data found.  Updated Vital Signs BP (!) 156/75 (BP Location: Right Arm)   Pulse 73   Temp 97.8 F (36.6 C) (Oral)   Resp 17   SpO2 99%   Visual Acuity Right Eye Distance:   Left Eye Distance:   Bilateral Distance:    Right Eye Near:   Left Eye Near:    Bilateral Near:     Physical Exam Vitals reviewed.  Constitutional:      General: She is not in acute distress.    Appearance: Normal appearance. She is not ill-appearing.  HENT:     Head: Normocephalic and atraumatic.     Right Ear: Hearing, tympanic membrane, ear canal and external ear normal. No swelling or tenderness. There is no impacted cerumen. No mastoid tenderness. Tympanic membrane is not perforated, erythematous, retracted or bulging.     Left Ear: Hearing, tympanic membrane, ear canal and external ear normal. No swelling or tenderness. There is no impacted cerumen.  No mastoid tenderness. Tympanic membrane is not perforated, erythematous, retracted or bulging.     Nose:     Right Sinus: No maxillary sinus tenderness or frontal sinus tenderness.     Left Sinus: No maxillary sinus tenderness or frontal sinus tenderness.     Mouth/Throat:  Mouth: Mucous membranes are moist.     Pharynx: Uvula midline. No oropharyngeal exudate or posterior oropharyngeal erythema.     Tonsils: No tonsillar exudate.  Cardiovascular:     Rate and Rhythm: Normal rate and regular rhythm.     Heart sounds: Normal heart sounds.  Pulmonary:     Breath sounds: Normal breath sounds and air entry. No wheezing, rhonchi or rales.  Chest:     Chest wall: No tenderness.  Abdominal:     General: Abdomen is flat. Bowel sounds are normal.     Tenderness: There is no abdominal tenderness. There is no guarding or rebound.  Lymphadenopathy:     Cervical: No cervical adenopathy.  Neurological:     General: No focal deficit present.     Mental Status: She is alert and oriented to person, place, and time.  Psychiatric:        Attention and Perception: Attention and perception normal.        Mood and Affect: Mood and affect normal.        Behavior: Behavior normal. Behavior is cooperative.        Thought Content: Thought content normal.        Judgment: Judgment normal.      UC Treatments / Results  Labs (all labs ordered are listed, but only abnormal results are displayed) Labs Reviewed  SARS CORONAVIRUS 2 (TAT 6-24 HRS)    EKG   Radiology No results found.  Procedures Procedures (including critical care time)  Medications Ordered in UC Medications - No data to display  Initial Impression / Assessment and Plan / UC Course  I have reviewed the triage vital signs and the nursing notes.  Pertinent labs & imaging results that were available during my care of the patient were reviewed by me and considered in my medical decision making (see chart for details).       -For canker sores/sore throat, use lidocaine mouthwash up to every 4 hours. Make sure not to eat for at least 1 hour after using this, as your mouth will be very numb and you could bite yourself. -Promethazine DM cough syrup for congestion/cough. This could make you drowsy, so take at night before bed. -Tessalon as needed for cough. Take one pill up to 3x daily (every 8 hours) -For fevers/chills, body aches, headaches- use Tylenol and Ibuprofen. You can alternate these for maximum effect. Use up to 3000mg  Tylenol daily and 3200mg  Ibuprofen daily. Make sure to take ibuprofen with food. Check the bottle of ibuprofen/tylenol for specific dosage instructions.  Covid test sent today. Patient is fully vaccinated for covid-19. Isolation precautions per CDC guidelines until negative result. Symptomatic relief with OTC Mucinex, Nyquil, etc. Return precautions- new/worsening fevers/chills, shortness of breath, chest pain, abd pain, etc.   Final Clinical Impressions(s) / UC Diagnoses   Final diagnoses:  Viral upper respiratory tract infection  Canker sore     Discharge Instructions     -For canker sores/sore throat, use lidocaine mouthwash up to every 4 hours. Make sure not to eat for at least 1 hour after using this, as your mouth will be very numb and you could bite yourself. -Promethazine DM cough syrup for congestion/cough. This could make you drowsy, so take at night before bed. -Tessalon as needed for cough. Take one pill up to 3x daily (every 8 hours) -For fevers/chills, body aches, headaches- use Tylenol and Ibuprofen. You can alternate these for maximum effect. Use up to 3000mg  Tylenol daily and  3200mg  Ibuprofen daily. Make sure to take ibuprofen with food. Check the bottle of ibuprofen/tylenol for specific dosage instructions.  We are currently awaiting result of your PCR covid-19 test. This typically comes back in 1-2 days. We'll call you if the result is positive. Otherwise, the result  will be sent electronically to your MyChart. You can also call this clinic and ask for your result via telephone.   Please isolate at home while awaiting these results. If your test is positive for Covid-19, continue to isolate at home for 5 days if you have mild symptoms, or a total of 10 days from symptom onset if you have more severe symptoms. If you quarantine for a shorter period of time (i.e. 5 days), make sure to wear a mask until day 10 of symptoms. Treat your symptoms at home with OTC remedies like tylenol/ibuprofen, mucinex, nyquil, etc. Seek medical attention if you develop high fevers, chest pain, shortness of breath, ear pain, facial pain, etc. Make sure to get up and move around every 2-3 hours while convalescing to help prevent blood clots. Drink plenty of fluids, and rest as much as possible.     ED Prescriptions    Medication Sig Dispense Auth. Provider   benzonatate (TESSALON) 100 MG capsule Take 1 capsule (100 mg total) by mouth every 8 (eight) hours. 21 capsule Hazel Sams, PA-C   promethazine-dextromethorphan (PROMETHAZINE-DM) 6.25-15 MG/5ML syrup Take 5 mLs by mouth 4 (four) times daily as needed for cough. 118 mL Marin Roberts E, PA-C   lidocaine (XYLOCAINE) 2 % solution Use as directed 15 mLs in the mouth or throat as needed for mouth pain. 100 mL Hazel Sams, PA-C     PDMP not reviewed this encounter.   Hazel Sams, PA-C 10/19/20 1404

## 2020-10-19 NOTE — ED Triage Notes (Signed)
Patient complains of generalized body aches and sore throat since Thursday or Friday. Pt also complains of sores on her gums since the other symptoms started. Pt is aox4 and ambulatory.

## 2020-10-20 LAB — SARS CORONAVIRUS 2 (TAT 6-24 HRS): SARS Coronavirus 2: NEGATIVE

## 2021-02-01 DIAGNOSIS — F17200 Nicotine dependence, unspecified, uncomplicated: Secondary | ICD-10-CM | POA: Insufficient documentation

## 2021-02-01 DIAGNOSIS — K760 Fatty (change of) liver, not elsewhere classified: Secondary | ICD-10-CM | POA: Insufficient documentation

## 2021-02-03 DIAGNOSIS — K760 Fatty (change of) liver, not elsewhere classified: Secondary | ICD-10-CM | POA: Diagnosis not present

## 2021-02-03 DIAGNOSIS — K7402 Hepatic fibrosis, advanced fibrosis: Secondary | ICD-10-CM | POA: Diagnosis not present

## 2021-02-03 DIAGNOSIS — K7400 Hepatic fibrosis, unspecified: Secondary | ICD-10-CM | POA: Diagnosis not present

## 2021-02-04 ENCOUNTER — Other Ambulatory Visit: Payer: Self-pay | Admitting: Nurse Practitioner

## 2021-02-04 DIAGNOSIS — H04123 Dry eye syndrome of bilateral lacrimal glands: Secondary | ICD-10-CM | POA: Diagnosis not present

## 2021-02-04 DIAGNOSIS — H2513 Age-related nuclear cataract, bilateral: Secondary | ICD-10-CM | POA: Diagnosis not present

## 2021-02-04 DIAGNOSIS — K7400 Hepatic fibrosis, unspecified: Secondary | ICD-10-CM

## 2021-02-04 DIAGNOSIS — H524 Presbyopia: Secondary | ICD-10-CM | POA: Diagnosis not present

## 2021-02-04 DIAGNOSIS — H52203 Unspecified astigmatism, bilateral: Secondary | ICD-10-CM | POA: Diagnosis not present

## 2021-02-17 ENCOUNTER — Other Ambulatory Visit: Payer: Self-pay | Admitting: Nurse Practitioner

## 2021-02-17 DIAGNOSIS — K760 Fatty (change of) liver, not elsewhere classified: Secondary | ICD-10-CM

## 2021-02-17 DIAGNOSIS — K7400 Hepatic fibrosis, unspecified: Secondary | ICD-10-CM

## 2021-02-22 ENCOUNTER — Ambulatory Visit
Admission: RE | Admit: 2021-02-22 | Discharge: 2021-02-22 | Disposition: A | Discharge status: Home / Self Care | Payer: BLUE CROSS/BLUE SHIELD | Source: Ambulatory Visit | Attending: Nurse Practitioner | Admitting: Nurse Practitioner

## 2021-02-22 DIAGNOSIS — K760 Fatty (change of) liver, not elsewhere classified: Secondary | ICD-10-CM | POA: Diagnosis not present

## 2021-02-22 DIAGNOSIS — K7689 Other specified diseases of liver: Secondary | ICD-10-CM | POA: Diagnosis not present

## 2021-02-22 DIAGNOSIS — K7400 Hepatic fibrosis, unspecified: Secondary | ICD-10-CM

## 2021-04-05 ENCOUNTER — Other Ambulatory Visit: Payer: Self-pay

## 2021-04-05 ENCOUNTER — Encounter: Payer: Self-pay | Admitting: Internal Medicine

## 2021-04-05 ENCOUNTER — Ambulatory Visit (INDEPENDENT_AMBULATORY_CARE_PROVIDER_SITE_OTHER): Payer: Medicare Other | Admitting: Internal Medicine

## 2021-04-05 VITALS — BP 114/70 | HR 74 | Temp 98.4°F | Ht 63.2 in | Wt 138.2 lb

## 2021-04-05 DIAGNOSIS — Z0000 Encounter for general adult medical examination without abnormal findings: Secondary | ICD-10-CM | POA: Diagnosis not present

## 2021-04-05 DIAGNOSIS — D696 Thrombocytopenia, unspecified: Secondary | ICD-10-CM | POA: Diagnosis not present

## 2021-04-05 DIAGNOSIS — R0789 Other chest pain: Secondary | ICD-10-CM

## 2021-04-05 DIAGNOSIS — E2839 Other primary ovarian failure: Secondary | ICD-10-CM

## 2021-04-05 DIAGNOSIS — M2550 Pain in unspecified joint: Secondary | ICD-10-CM

## 2021-04-05 DIAGNOSIS — K7469 Other cirrhosis of liver: Secondary | ICD-10-CM | POA: Insufficient documentation

## 2021-04-05 DIAGNOSIS — R079 Chest pain, unspecified: Secondary | ICD-10-CM | POA: Diagnosis not present

## 2021-04-05 DIAGNOSIS — Z87891 Personal history of nicotine dependence: Secondary | ICD-10-CM | POA: Insufficient documentation

## 2021-04-05 DIAGNOSIS — C220 Liver cell carcinoma: Secondary | ICD-10-CM | POA: Insufficient documentation

## 2021-04-05 NOTE — Progress Notes (Signed)
I,Katawbba Wiggins,acting as a Education administrator for Maximino Greenland, MD.,have documented all relevant documentation on the behalf of Maximino Greenland, MD,as directed by  Maximino Greenland, MD while in the presence of Maximino Greenland, MD.  This visit occurred during the SARS-CoV-2 public health emergency.  Safety protocols were in place, including screening questions prior to the visit, additional usage of staff PPE, and extensive cleaning of exam room while observing appropriate contact time as indicated for disinfecting solutions.  Subjective:     Patient ID: Taylor Santiago , female    DOB: Jun 03, 1956 , 65 y.o.   MRN: [id number]   Chief Complaint  Patient presents with   Annual Exam    HPI  The patient is here today for a physical exam.  She has been seeing a practitioner in [facility]; however, she was not satisfied with her care. She wants to return here for primary care.   Today, she is most concerned about having some chest pain. Described as a dull, achy pain located at her right breast that radiates to her back. Her sx started more than a month ago. She is not sure what may have triggered her sx. She initially thought it was heartburn, but OTC meds were ineffective. Sx occur both at rest and w/ exertion.  She denies having associated sob, palpitations and diaphoresis.   Chest Pain  This is a recurrent problem. The current episode started more than 1 month ago. The onset quality is gradual. The problem occurs every several days. The problem has been unchanged. The pain is present in the lateral region. The pain is at a severity of 8/10. The pain is severe. The quality of the pain is described as dull.    Past Medical History:  Diagnosis Date   Abnormal LFTs (liver function tests)    Cirrhosis (HCC)    Hepatitis C, chronic ([place]) 2004   genotype 1a   Medical history non-contributory    Thrombocytopenia ([place])      Family History  Problem Relation Age of Onset   Hypertension Mother     Diabetes Mother    Ovarian cancer Mother    Heart disease Maternal Grandmother    Heart disease Maternal Grandfather    Colon cancer Neg Hx    Breast cancer Neg Hx      Current Outpatient Medications:    acetaminophen (TYLENOL) 500 MG tablet, Take 1 tablet (500 mg total) by mouth every 6 (six) hours as needed., Disp: 30 tablet, Rfl: 0   fluticasone (FLONASE) 50 MCG/ACT nasal spray, Place into both nostrils daily., Disp: , Rfl:    Saline (AYR NASAL MIST ALLERGY/SINUS NA), Place into the nose., Disp: , Rfl:    albuterol (PROVENTIL HFA;VENTOLIN HFA) 108 (90 BASE) MCG/ACT inhaler, Inhale 2 puffs into the lungs every 6 (six) hours as needed for wheezing or shortness of breath (rescue). (Patient not taking: Reported on 04/05/2021), Disp: , Rfl:    benzonatate (TESSALON) 100 MG capsule, Take 1 capsule (100 mg total) by mouth every 8 (eight) hours., Disp: 21 capsule, Rfl: 0   denosumab (PROLIA) 60 MG/ML SOSY injection, INJECT 60 MG UNDER THE SKIN EVERY 6 MONTHS. (Patient not taking: Reported on 04/05/2021), Disp: 1 Syringe, Rfl: 1   diphenhydrAMINE HCl (ALLERGY MED PO), Take by mouth. (Patient not taking: Reported on 04/05/2021), Disp: , Rfl:    ibuprofen (ADVIL,MOTRIN) 200 MG tablet, Take 200 mg by mouth every 6 (six) hours as needed. (Patient not taking: Reported on  04/05/2021), Disp: , Rfl:    lidocaine (XYLOCAINE) 2 % solution, Use as directed 15 mLs in the mouth or throat as needed for mouth pain. (Patient not taking: Reported on 04/05/2021), Disp: 100 mL, Rfl: 0   promethazine-dextromethorphan (PROMETHAZINE-DM) 6.25-15 MG/5ML syrup, Take 5 mLs by mouth 4 (four) times daily as needed for cough. (Patient not taking: Reported on 04/05/2021), Disp: 118 mL, Rfl: 0   Allergies  Allergen Reactions   Hydrocodone Itching   Penicillins Hives    Sweats Has patient had a PCN reaction causing immediate rash, facial/tongue/throat swelling, SOB or lightheadedness with hypotension: No Has patient had a PCN  reaction causing severe rash involving mucus membranes or skin necrosis:No Has patient had a PCN reaction that required hospitalization No Has patient had a PCN reaction occurring within the last 10 years: no   If all of the above answers are "NO", then may proceed with Cephalosporin use.    Percocet [Oxycodone-Acetaminophen]    Prednisone Rash      The patient states she uses post menopausal status for birth control. Last LMP was No LMP recorded. Patient has had a hysterectomy.. Negative for Dysmenorrhea. Negative for: breast discharge, breast lump(s), breast pain and breast self exam. Associated symptoms include abnormal vaginal bleeding. Pertinent negatives include abnormal bleeding (hematology), anxiety, decreased libido, depression, difficulty falling sleep, dyspareunia, history of infertility, nocturia, sexual dysfunction, sleep disturbances, urinary incontinence, urinary urgency, vaginal discharge and vaginal itching. Diet regular.The patient states her exercise level is  intermittent.  . The patient's tobacco use is:  Social History   Tobacco Use  Smoking Status Former   Packs/day: 0.50   Years: 26.00   Pack years: 13.00   Types: Cigarettes   Start date: 47   Quit date: 2000   Years since quitting: 22.5  Smokeless Tobacco Never  . She has been exposed to passive smoke. The patient's alcohol use is:  Social History   Substance and Sexual Activity  Alcohol Use No   Comment: 6 pack-12 pack a month; as of 11/25/16 "only on holidays"      Review of Systems  Constitutional: Negative.   HENT: Negative.    Eyes: Negative.   Respiratory: Negative.    Cardiovascular:  Positive for chest pain.  Gastrointestinal: Negative.   Endocrine: Negative.   Genitourinary: Negative.   Musculoskeletal:  Positive for arthralgias.       She c/o generalized joint pains. States "I hurt all over". Denies fall/trauma. States her family all had arthritis. No mention of RA. Denies rash,fever,  chills.  States she often feels "stiff".   Skin: Negative.   Allergic/Immunologic: Negative.   Neurological: Negative.   Hematological: Negative.   Psychiatric/Behavioral: Negative.    All other systems reviewed and are negative.   Today's Vitals   04/05/21 1550  BP: 114/70  Pulse: 74  Temp: 98.4 F (36.9 C)  TempSrc: Oral  Weight: 138 lb 3.2 oz (62.7 kg)  Height: 5' 3.2" (1.605 m)  PainSc: 8    Body mass index is 24.33 kg/m.  Wt Readings from Last 3 Encounters:  04/05/21 138 lb 3.2 oz (62.7 kg)  04/02/19 137 lb 6.4 oz (62.3 kg)  10/18/18 139 lb (63 kg)    BP Readings from Last 3 Encounters:  04/05/21 114/70  10/19/20 (!) 156/75  07/09/19 111/85    Objective:  Physical Exam Vitals and nursing note reviewed.  Constitutional:      Appearance: Normal appearance.  HENT:     Head: Normocephalic  and atraumatic.     Right Ear: Tympanic membrane, ear canal and external ear normal.     Left Ear: Tympanic membrane, ear canal and external ear normal.     Nose:     Comments: Masked     Mouth/Throat:     Comments: Masked  Eyes:     Extraocular Movements: Extraocular movements intact.     Conjunctiva/sclera: Conjunctivae normal.     Pupils: Pupils are equal, round, and reactive to light.  Cardiovascular:     Rate and Rhythm: Normal rate and regular rhythm.     Pulses: Normal pulses.     Heart sounds: Normal heart sounds.  Pulmonary:     Effort: Pulmonary effort is normal.     Breath sounds: Normal breath sounds.     Comments: Anterior chest is not tender to palpation Chest:  Breasts:    Tanner Score is 5.     Right: Normal.     Left: Normal.  Abdominal:     General: Abdomen is flat. Bowel sounds are normal.     Palpations: Abdomen is soft.  Genitourinary:    Comments: deferred Musculoskeletal:        General: Normal range of motion.     Cervical back: Normal range of motion and neck supple.  Skin:    General: Skin is warm and dry.  Neurological:     General:  No focal deficit present.     Mental Status: She is alert and oriented to person, place, and time.  Psychiatric:        Mood and Affect: Mood normal.        Behavior: Behavior normal.        Assessment And Plan:     1. Routine general medical examination at a health care facility Comments: A full exam was performed. Importance of monthly self breast exams was discussed with the patient. Previous PCP notes reviewed in Care Everywhere. Last mammo performed Aug 20, 2020 at [facility] in [place]. She would like to have next mammo performed at the [facility]. PATIENT IS ADVISED TO GET 30-45 MINUTES REGULAR EXERCISE NO LESS THAN FOUR TO FIVE DAYS PER WEEK - BOTH WEIGHTBEARING EXERCISES AND AEROBIC ARE RECOMMENDED.  PATIENT IS ADVISED TO FOLLOW A HEALTHY DIET WITH AT LEAST SIX FRUITS/VEGGIES PER DAY, DECREASE INTAKE OF RED MEAT, AND TO INCREASE FISH INTAKE TO TWO DAYS PER WEEK.  MEATS/FISH SHOULD NOT BE FRIED, BAKED OR BROILED IS PREFERABLE.  IT IS ALSO IMPORTANT TO CUT BACK ON YOUR SUGAR INTAKE. PLEASE AVOID ANYTHING WITH ADDED SUGAR, CORN SYRUP OR OTHER SWEETENERS. IF YOU MUST USE A SWEETENER, YOU CAN TRY STEVIA. IT IS ALSO IMPORTANT TO AVOID ARTIFICIALLY SWEETENERS AND DIET BEVERAGES. LASTLY, I SUGGEST WEARING SPF 50 SUNSCREEN ON EXPOSED PARTS AND ESPECIALLY WHEN IN THE DIRECT SUNLIGHT FOR AN EXTENDED PERIOD OF TIME.  PLEASE AVOID FAST FOOD RESTAURANTS AND INCREASE YOUR WATER INTAKE.  - MM Digital Screening; Future  2. Other chest pain Comments: EKG performed, NSR w/o acute changes. She has 10+packyear h/o tobacco use. She agrees to Cardiology referral for further evaluation. She is encouraged to go to ER for worsening sx.  - EKG 12-Lead - CMP14+EGFR - Lipid panel - DG Chest 2 View; Future - Ambulatory referral to Cardiology  3. Thrombocytopenia ([place]) Comments: Possibly related to liver fibrosis. Hepatology notes reviewed. I will check CBC w/ diff today.  - CBC with Diff  4. Arthralgia, unspecified  joint Comments: She c/o gen. arthralgias. I  will check arthritis panel. Encouraged to follow anti-inflammatory diet, free of processed foods and sweetened drinks.  - ANA, IFA (with reflex) - Rheumatoid factor - Sedimentation rate - Uric acid - CYCLIC CITRUL PEPTIDE ANTIBODY, IGG/IGA  5. History of tobacco use Comments: She reports quitting in 2000, does not qualify for low dose CT chest.   6. Estrogen deficiency - DG Bone Density; Future  Patient was given opportunity to ask questions. Patient verbalized understanding of the plan and was able to repeat key elements of the plan. All questions were answered to their satisfaction.   I, Maximino Greenland, MD, have reviewed all documentation for this visit. The documentation on 04/05/21 for the exam, diagnosis, procedures, and orders are all accurate and complete.  THE PATIENT IS ENCOURAGED TO PRACTICE SOCIAL DISTANCING DUE TO THE COVID-19 PANDEMIC.

## 2021-04-05 NOTE — Patient Instructions (Addendum)
The 10-year ASCVD risk score Mikey Bussing DC Brooke Bonito., et al., 2013) is: 9.1%   Values used to calculate the score:     Age: 65 years     Sex: Female     Is Non-Hispanic African American: Yes     Diabetic: No     Tobacco smoker: Yes     Systolic Blood Pressure: 250 mmHg     Is BP treated: No     HDL Cholesterol: 52 MG/DL     Total Cholesterol: 149 MG/DL  Health Maintenance, Female Adopting a healthy lifestyle and getting preventive care are important in promoting health and wellness. Ask your health care provider about: The right schedule for you to have regular tests and exams. Things you can do on your own to prevent diseases and keep yourself healthy. What should I know about diet, weight, and exercise? Eat a healthy diet  Eat a diet that includes plenty of vegetables, fruits, low-fat dairy products, and lean protein. Do not eat a lot of foods that are high in solid fats, added sugars, or sodium.  Maintain a healthy weight Body mass index (BMI) is used to identify weight problems. It estimates body fat based on height and weight. Your health care provider can help determineyour BMI and help you achieve or maintain a healthy weight. Get regular exercise Get regular exercise. This is one of the most important things you can do for your health. Most adults should: Exercise for at least 150 minutes each week. The exercise should increase your heart rate and make you sweat (moderate-intensity exercise). Do strengthening exercises at least twice a week. This is in addition to the moderate-intensity exercise. Spend less time sitting. Even light physical activity can be beneficial. Watch cholesterol and blood lipids Have your blood tested for lipids and cholesterol at 65 years of age, then havethis test every 5 years. Have your cholesterol levels checked more often if: Your lipid or cholesterol levels are high. You are older than 65 years of age. You are at high risk for heart disease. What should  I know about cancer screening? Depending on your health history and family history, you may need to have cancer screening at various ages. This may include screening for: Breast cancer. Cervical cancer. Colorectal cancer. Skin cancer. Lung cancer. What should I know about heart disease, diabetes, and high blood pressure? Blood pressure and heart disease High blood pressure causes heart disease and increases the risk of stroke. This is more likely to develop in people who have high blood pressure readings, are of African descent, or are overweight. Have your blood pressure checked: Every 3-5 years if you are 28-62 years of age. Every year if you are 96 years old or older. Diabetes Have regular diabetes screenings. This checks your fasting blood sugar level. Have the screening done: Once every three years after age 52 if you are at a normal weight and have a low risk for diabetes. More often and at a younger age if you are overweight or have a high risk for diabetes. What should I know about preventing infection? Hepatitis B If you have a higher risk for hepatitis B, you should be screened for this virus. Talk with your health care provider to find out if you are at risk forhepatitis B infection. Hepatitis C Testing is recommended for: Everyone born from [birth year] through 1965. Anyone with known risk factors for hepatitis C. Sexually transmitted infections (STIs) Get screened for STIs, including gonorrhea and chlamydia, if: You are  sexually active and are younger than 65 years of age. You are older than 65 years of age and your health care provider tells you that you are at risk for this type of infection. Your sexual activity has changed since you were last screened, and you are at increased risk for chlamydia or gonorrhea. Ask your health care provider if you are at risk. Ask your health care provider about whether you are at high risk for HIV. Your health care provider may recommend a  prescription medicine to help prevent HIV infection. If you choose to take medicine to prevent HIV, you should first get tested for HIV. You should then be tested every 3 months for as long as you are taking the medicine. Pregnancy If you are about to stop having your period (premenopausal) and you may become pregnant, seek counseling before you get pregnant. Take 400 to 800 micrograms (mcg) of folic acid every day if you become pregnant. Ask for birth control (contraception) if you want to prevent pregnancy. Osteoporosis and menopause Osteoporosis is a disease in which the bones lose minerals and strength with aging. This can result in bone fractures. If you are 15 years old or older, or if you are at risk for osteoporosis and fractures, ask your health care provider if you should: Be screened for bone loss. Take a calcium or vitamin D supplement to lower your risk of fractures. Be given hormone replacement therapy (HRT) to treat symptoms of menopause. Follow these instructions at home: Lifestyle Do not use any products that contain nicotine or tobacco, such as cigarettes, e-cigarettes, and chewing tobacco. If you need help quitting, ask your health care provider. Do not use street drugs. Do not share needles. Ask your health care provider for help if you need support or information about quitting drugs. Alcohol use Do not drink alcohol if: Your health care provider tells you not to drink. You are pregnant, may be pregnant, or are planning to become pregnant. If you drink alcohol: Limit how much you use to 0-1 drink a day. Limit intake if you are breastfeeding. Be aware of how much alcohol is in your drink. In the [place] one drink equals one 12 oz bottle of beer (355 mL), one 5 oz glass of wine (148 mL), or one 1 oz glass of hard liquor (44 mL). General instructions Schedule regular health, dental, and eye exams. Stay current with your vaccines. Tell your health care provider if: You  often feel depressed. You have ever been abused or do not feel safe at home. Summary Adopting a healthy lifestyle and getting preventive care are important in promoting health and wellness. Follow your health care provider's instructions about healthy diet, exercising, and getting tested or screened for diseases. Follow your health care provider's instructions on monitoring your cholesterol and blood pressure. This information is not intended to replace advice given to you by your health care provider. Make sure you discuss any questions you have with your healthcare provider. Document Revised: 09/19/2018 Document Reviewed: 09/19/2018 Elsevier Patient Education  2022 [employer].

## 2021-04-07 ENCOUNTER — Ambulatory Visit
Admission: RE | Admit: 2021-04-07 | Discharge: 2021-04-07 | Disposition: A | Discharge status: Home / Self Care | Payer: Medicare Other | Source: Ambulatory Visit | Attending: Internal Medicine | Admitting: Internal Medicine

## 2021-04-07 DIAGNOSIS — R079 Chest pain, unspecified: Secondary | ICD-10-CM | POA: Diagnosis not present

## 2021-04-07 DIAGNOSIS — R0789 Other chest pain: Secondary | ICD-10-CM

## 2021-04-15 ENCOUNTER — Encounter: Payer: Self-pay | Admitting: Internal Medicine

## 2021-04-15 LAB — CBC WITH DIFFERENTIAL/PLATELET
Basophils Absolute: 0 10*3/uL (ref 0.0–0.2)
Basos: 0 %
EOS (ABSOLUTE): 0.1 10*3/uL (ref 0.0–0.4)
Eos: 1 %
Hematocrit: 35.6 % (ref 34.0–46.6)
Hemoglobin: 11.6 g/dL (ref 11.1–15.9)
Immature Grans (Abs): 0 10*3/uL (ref 0.0–0.1)
Immature Granulocytes: 0 %
Lymphocytes Absolute: 3.3 10*3/uL — ABNORMAL HIGH (ref 0.7–3.1)
Lymphs: 58 %
MCH: 29.6 pg (ref 26.6–33.0)
MCHC: 32.6 g/dL (ref 31.5–35.7)
MCV: 91 fL (ref 79–97)
Monocytes Absolute: 0.4 10*3/uL (ref 0.1–0.9)
Monocytes: 7 %
Neutrophils Absolute: 2 10*3/uL (ref 1.4–7.0)
Neutrophils: 34 %
Platelets: 103 10*3/uL — ABNORMAL LOW (ref 150–450)
RBC: 3.92 x10E6/uL (ref 3.77–5.28)
RDW: 11.3 % — ABNORMAL LOW (ref 11.7–15.4)
WBC: 5.8 10*3/uL (ref 3.4–10.8)

## 2021-04-15 LAB — CMP14+EGFR
ALT: 22 IU/L (ref 0–32)
AST: 34 IU/L (ref 0–40)
Albumin/Globulin Ratio: 1.5 (ref 1.2–2.2)
Albumin: 4.8 g/dL (ref 3.8–4.8)
Alkaline Phosphatase: 125 IU/L — ABNORMAL HIGH (ref 44–121)
BUN/Creatinine Ratio: 14 (ref 12–28)
BUN: 12 mg/dL (ref 8–27)
Bilirubin Total: 0.4 mg/dL (ref 0.0–1.2)
CO2: 24 mmol/L (ref 20–29)
Calcium: 9.8 mg/dL (ref 8.7–10.3)
Chloride: 103 mmol/L (ref 96–106)
Creatinine, Ser: 0.87 mg/dL (ref 0.57–1.00)
Globulin, Total: 3.2 g/dL (ref 1.5–4.5)
Glucose: 92 mg/dL (ref 65–99)
Potassium: 4.5 mmol/L (ref 3.5–5.2)
Sodium: 142 mmol/L (ref 134–144)
Total Protein: 8 g/dL (ref 6.0–8.5)
eGFR: 74 mL/min/{1.73_m2} (ref >59)

## 2021-04-15 LAB — ANTINUCLEAR ANTIBODIES, IFA: ANA Titer 1: NEGATIVE

## 2021-04-15 LAB — SEDIMENTATION RATE: Sed Rate: 14 mm/hr (ref 0–40)

## 2021-04-15 LAB — LIPID PANEL
Chol/HDL Ratio: 3.5 ratio (ref 0.0–4.4)
Cholesterol, Total: 179 mg/dL (ref 100–199)
HDL: 51 mg/dL (ref >39)
LDL Chol Calc (NIH): 114 mg/dL — ABNORMAL HIGH (ref 0–99)
Triglycerides: 76 mg/dL (ref 0–149)
VLDL Cholesterol Cal: 14 mg/dL (ref 5–40)

## 2021-04-15 LAB — URIC ACID: Uric Acid: 5.7 mg/dL (ref 3.0–7.2)

## 2021-04-15 LAB — RHEUMATOID FACTOR: Rheumatoid fact SerPl-aCnc: 29.9 IU/mL — ABNORMAL HIGH (ref <14.0)

## 2021-04-15 LAB — CYCLIC CITRUL PEPTIDE ANTIBODY, IGG/IGA: Cyclic Citrullin Peptide Ab: 8 units (ref 0–19)

## 2021-04-16 ENCOUNTER — Other Ambulatory Visit: Payer: Self-pay | Admitting: Internal Medicine

## 2021-04-16 DIAGNOSIS — M2550 Pain in unspecified joint: Secondary | ICD-10-CM

## 2021-04-16 DIAGNOSIS — M0580 Other rheumatoid arthritis with rheumatoid factor of unspecified site: Secondary | ICD-10-CM

## 2021-04-23 ENCOUNTER — Encounter: Payer: Self-pay | Admitting: Internal Medicine

## 2021-04-26 ENCOUNTER — Encounter: Payer: Self-pay | Admitting: Cardiology

## 2021-04-26 ENCOUNTER — Ambulatory Visit: Payer: Medicare Other | Admitting: Cardiology

## 2021-04-26 ENCOUNTER — Other Ambulatory Visit: Payer: Self-pay

## 2021-04-26 VITALS — BP 131/79 | HR 57 | Temp 97.6°F | Resp 16 | Ht 63.0 in | Wt 137.8 lb

## 2021-04-26 DIAGNOSIS — R0789 Other chest pain: Secondary | ICD-10-CM

## 2021-04-26 DIAGNOSIS — R079 Chest pain, unspecified: Secondary | ICD-10-CM | POA: Diagnosis not present

## 2021-04-26 NOTE — Progress Notes (Signed)
Primary Physician/Referring:  Glendale Chard, MD  Patient ID: Taylor Santiago, female    DOB: Nov 03, 1955, 65 y.o.   MRN: [id number]  Chief Complaint  Patient presents with   Chest Pain   New Patient (Initial Visit)    Referred by Dr. Glendale Chard   HPI:    Taylor Santiago  is a 65 y.o. Patient is a 65yo female with a past medical history significant for hepatitis C, cirrhosis, thrombocytopenia, and a 10 pack year history of smoking. The patient presents to the office for evaluation of chest pain. She states that the chest pain started in June of 2022. She states that she just woke up with it one morning and it has persisted ever since. She describes the pain as being primarily on the lateral right of the sternum. The pain radiates through to her back to an area inferior to the right scapula. She states that the pain lasts most of the day and is present almost every day. The pain/soreness varies in intensity. She currently rates the pain as 7/10. She tried Tylenol for the pain and it did not provide relief. She states that Aleve did provide some relief. Movement helps relieve the pain. When she awakens with the pain in the morning, it makes her stoop over until further activity/movement helps it to feel better. The pain has disturbed her sleep some which makes her fatigued during the day.   The patient did have a CXR on 04/07/2021 that found mild scarring on her right lung. The patient is concerned that this may be a cause of her pain.   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 She uses public bus transportation and does a lot of walking. She has not had any changes in her ability to perform these activities since the pain started.    Past Medical History:  Diagnosis Date   Abnormal LFTs (liver function tests)    Cirrhosis ([place])    Hepatitis C virus infection    Patient stated she has been treated   Hepatitis C, chronic ([place]) 2004   genotype 1a   Hyperlipidemia    Medical  history non-contributory    Thrombocytopenia ([place])    Past Surgical History:  Procedure Laterality Date   ABDOMINAL HYSTERECTOMY     APPENDECTOMY     COLONOSCOPY  2013   IR TRANSCATHETER BX  07/09/2019   IR US GUIDE VASC ACCESS RIGHT  07/09/2019   IR VENOGRAM HEPATIC W HEMODYNAMIC EVALUATION  07/09/2019   PARTIAL HYSTERECTOMY     Family History  Problem Relation Age of Onset   Hypertension Mother    Diabetes Mother    Ovarian cancer Mother    Heart disease Maternal Grandmother    Heart disease Maternal Grandfather    Colon cancer Neg Hx    Breast cancer Neg Hx     Social History   Tobacco Use   Smoking status: Former    Packs/day: 0.50    Years: 26.00    Pack years: 13.00    Types: Cigarettes    Start date: 1974    Quit date: 2000    Years since quitting: 22.5   Smokeless tobacco: Never  Substance Use Topics   Alcohol use: No    Comment: 6 pack-12 pack a month; as of 11/25/16 "only on holidays"   Marital Status: Single  ROS  Review of Systems  Constitutional: Negative for weight gain and weight loss.  Cardiovascular:  Positive  for chest pain. Negative for dyspnea on exertion, irregular heartbeat, leg swelling and palpitations.  Respiratory:         Negative for pleurisy.  Musculoskeletal:  Positive for back pain.  Objective  Blood pressure 131/79, pulse (!) 57, temperature 97.6 F (36.4 C), temperature source Temporal, resp. rate 16, height 5\' 3"  (1.6 m), weight 62.5 kg, SpO2 95 %. Body mass index is 24.41 kg/m.  Vitals with BMI 04/26/2021 04/05/2021 10/19/2020  Height 5\' 3"  5' 3.2" -  Weight 137 lbs 13 oz 138 lbs 3 oz -  BMI 62.83 15.17 -  Systolic 616 073 710  Diastolic 79 70 75  Pulse 57 74 73     Physical Exam Constitutional:      Comments: Thin female in no apparent distress  Neck:     Vascular: No carotid bruit.  Cardiovascular:     Rate and Rhythm: Normal rate and regular rhythm.     Pulses: Normal pulses.     Heart sounds: Normal heart sounds.      Comments: Bilateral lower leg varicosities present Pulmonary:     Effort: Pulmonary effort is normal.     Breath sounds: Normal breath sounds.  Abdominal:     General: Bowel sounds are normal. There is no abdominal bruit.     Palpations: Abdomen is soft.     Tenderness: There is no abdominal tenderness. There is no guarding.  Musculoskeletal:        General: Tenderness (to palpation over the right side of the chest and over the right scapula) present.     Right lower leg: No edema.     Left lower leg: No edema.  Neurological:     Mental Status: She is alert.     Laboratory examination:   Recent Labs    04/05/21 1719  NA 142  K 4.5  CL 103  CO2 24  GLUCOSE 92  BUN 12  CREATININE 0.87  CALCIUM 9.8   estimated creatinine clearance is 53.3 mL/min (by C-G formula based on SCr of 0.87 mg/dL).  CMP Latest Ref Rng & Units 04/05/2021 07/09/2019 04/02/2019  Glucose 65 - 99 mg/dL 92 107(H) 106(H)  BUN 8 - 27 mg/dL 12 12 12   Creatinine 0.57 - 1.00 mg/dL 0.87 0.74 0.85  Sodium 134 - 144 mmol/L 142 137 141  Potassium 3.5 - 5.2 mmol/L 4.5 3.8 4.7  Chloride 96 - 106 mmol/L 103 106 102  CO2 20 - 29 mmol/L 24 22 22   Calcium 8.7 - 10.3 mg/dL 9.8 8.7(L) 9.7  Total Protein 6.0 - 8.5 g/dL 8.0 7.7 8.4  Total Bilirubin 0.0 - 1.2 mg/dL 0.4 1.2 0.6  Alkaline Phos 44 - 121 IU/L 125(H) 54 66  AST 0 - 40 IU/L 34 67(H) 43(H)  ALT 0 - 32 IU/L 22 65(H) 39(H)   CBC Latest Ref Rng & Units 04/05/2021 07/09/2019 04/02/2019  WBC 3.4 - 10.8 x10E3/uL 5.8 4.3 5.6  Hemoglobin 11.1 - 15.9 g/dL 11.6 11.3(L) 12.7  Hematocrit 34.0 - 46.6 % 35.6 35.0(L) 38.6  Platelets 150 - 450 x10E3/uL 103(L) 77(L) 104(L)    Lipid Panel Recent Labs    04/05/21 1719  CHOL 179  TRIG 76  LDLCALC 114*  HDL 51  CHOLHDL 3.5   Lipid Panel     Component Value Date/Time   CHOL 179 04/05/2021 1719   TRIG 76 04/05/2021 1719   HDL 51 04/05/2021 1719   CHOLHDL 3.5 04/05/2021 1719   LDLCALC 114 (H) 04/05/2021 1719  LABVLDL  14 04/05/2021 1719     HEMOGLOBIN A1C Lab Results  Component Value Date   HGBA1C 5.3 10/18/2018   TSH No results for input(s): TSH in the last 8760 hours.  Medications and allergies   Allergies  Allergen Reactions   Hydrocodone Itching   Penicillins Hives    Sweats Has patient had a PCN reaction causing immediate rash, facial/tongue/throat swelling, SOB or lightheadedness with hypotension: No Has patient had a PCN reaction causing severe rash involving mucus membranes or skin necrosis:No Has patient had a PCN reaction that required hospitalization No Has patient had a PCN reaction occurring within the last 10 years: no   If all of the above answers are "NO", then may proceed with Cephalosporin use.    Percocet [Oxycodone-Acetaminophen]    Prednisone Rash     Medication prior to this encounter:   Outpatient Medications Prior to Visit  Medication Sig Dispense Refill   acetaminophen (TYLENOL) 500 MG tablet Take 1 tablet (500 mg total) by mouth every 6 (six) hours as needed. 30 tablet 0   ibuprofen (ADVIL,MOTRIN) 200 MG tablet Take 200 mg by mouth every 6 (six) hours as needed.     denosumab (PROLIA) 60 MG/ML SOSY injection INJECT 60 MG UNDER THE SKIN EVERY 6 MONTHS. (Patient not taking: No sig reported) 1 Syringe 1   albuterol (PROVENTIL HFA;VENTOLIN HFA) 108 (90 BASE) MCG/ACT inhaler Inhale 2 puffs into the lungs every 6 (six) hours as needed for wheezing or shortness of breath (rescue). (Patient not taking: Reported on 04/05/2021)     benzonatate (TESSALON) 100 MG capsule Take 1 capsule (100 mg total) by mouth every 8 (eight) hours. 21 capsule 0   diphenhydrAMINE HCl (ALLERGY MED PO) Take by mouth. (Patient not taking: Reported on 04/05/2021)     fluticasone (FLONASE) 50 MCG/ACT nasal spray Place into both nostrils daily.     lidocaine (XYLOCAINE) 2 % solution Use as directed 15 mLs in the mouth or throat as needed for mouth pain. (Patient not taking: Reported on 04/05/2021) 100 mL  0   promethazine-dextromethorphan (PROMETHAZINE-DM) 6.25-15 MG/5ML syrup Take 5 mLs by mouth 4 (four) times daily as needed for cough. (Patient not taking: Reported on 04/05/2021) 118 mL 0   Saline (AYR NASAL MIST ALLERGY/SINUS NA) Place into the nose.     No facility-administered medications prior to visit.     Medication list after today's encounter   Current Outpatient Medications  Medication Instructions   acetaminophen (TYLENOL) 500 mg, Oral, Every 6 hours PRN   denosumab (PROLIA) 60 MG/ML SOSY injection INJECT 60 MG UNDER THE SKIN EVERY 6 MONTHS.   ibuprofen (ADVIL) 200 mg, Oral, Every 6 hours PRN    Radiology:   CXR PA/LAT view 6/3o0/2022: Mediastinum hilar structures normal. Heart size stable. Mild right base subsegmental axis and or scarring again noted. No acute infiltrate. No pleural effusion or pneumothorax. No acute bony abnormality. No change from 12/25/2018.  Cardiac Studies:   None  EKG:   EKG 04/26/2021: Normal sinus rhythm at rate of 63 bpm with sinus arrhythmia.  Incomplete right bundle branch block.  Otherwise normal EKG.   Assessment     ICD-10-CM   1. Chest pain of uncertain etiology  K44.0 EKG 12-Lead       Medications Discontinued During This Encounter  Medication Reason   albuterol (PROVENTIL HFA;VENTOLIN HFA) 108 (90 BASE) MCG/ACT inhaler Error   benzonatate (TESSALON) 100 MG capsule Error   diphenhydrAMINE HCl (ALLERGY MED PO) Error  fluticasone (FLONASE) 50 MCG/ACT nasal spray Error   lidocaine (XYLOCAINE) 2 % solution Error   promethazine-dextromethorphan (PROMETHAZINE-DM) 6.25-15 MG/5ML syrup Error   Saline (AYR NASAL MIST ALLERGY/SINUS NA) Error    No orders of the defined types were placed in this encounter.  Orders Placed This Encounter  Procedures   EKG 12-Lead   Recommendations:   Taylor Santiago is a 65yo female with a past medical history significant for hepatitis C, cirrhosis, thrombocytopenia, and a 10 pack year history  of smoking. The patient presents to the office for evaluation of chest pain. EKG showed no acute abnormalities. Review of lipid panel from 04/05/2021 showed elevated LDL at 114. CXR performed by PCP found no acute cardiopulmonary disease. Overall, history, physical exam, and EKG and CXR showed no findings concerning for an acute cardiac cause for this patient's pain. Findings suggest a musculoskeletal etiology for the pain.   Patient was instructed to take Aleve (Naproxen sodium) for one week to help relieve her pain and then on an as needed basis to prevent GI side effects of Naproxen.  Patient does report a history of pneumonia as a child. Patient was reassured her pneumonia history and her history of smoking is likely the cause of her right lung scarring.   Patient may follow up as needed.     Pollock, PA-S 04/26/2021, 11:54 AM Office: [phone]

## 2021-05-19 ENCOUNTER — Telehealth: Payer: Self-pay

## 2021-05-19 NOTE — Telephone Encounter (Signed)
I returned the pt's call and left a message that the referral specialist said that the pt should be able to schedule her colonoscopy with Dr Collene Mares were she had her last colonoscopy done at.

## 2021-05-24 NOTE — Progress Notes (Signed)
Office Visit Note  Patient: Taylor Santiago             Date of Birth: Apr 01, 1956           MRN: [id number]             PCP: Glendale Chard, MD Referring: Glendale Chard, MD Visit Date: 05/25/2021 Occupation: Dry cleaning  Subjective:  New Patient (Initial Visit) (Patient complains of bilateral lower extremity pain that comes and goes. Patient has generalized joint stiffness as well. Patient works at a dry cleaner doing repetitive motions.)   History of Present Illness: Taylor Santiago is a 65 y.o. female here for joint pain of multiple sites and positive rheumatoid factor. She has a history of previously treated HCV. Symptoms are chronic but increase during the past few months. She has some shoulder pain reaching high overhead and behind the back and hand stiffness but her complaint is mostly in her legs. She has all over pain for a few minutes in the morning this improves but worsens with prolonged use during the day. She has noticed occasional burning or sharp type pain in her feet and ankles that comes and goes. She sometimes notices increased heat at her feet but no redness or swelling. When symptoms are bothersome she has tried tylenol with no improvement and ibuprofen or aleve are helpful temporarily. Lab workup for these symptoms showed positive RF antibodies.   Labs reviewed 03/2021 ANA neg RF 29.9 CCP 8 ESR 14 CBC Plts 103 CMP alk phos 125 Uric acid 5.7   02/23/21 [place] RUQ Diffuse heterogeneous echogenicity of the hepatic parenchyma is a nonspecific indicator of hepatocellular dysfunction. No focal hepatic lesion.  Activities of Daily Living:  Patient reports morning stiffness for several hours.   Patient Reports nocturnal pain.  Difficulty dressing/grooming: Reports Difficulty climbing stairs: Reports Difficulty getting out of chair: Reports Difficulty using hands for taps, buttons, cutlery, and/or writing: Reports  Review of Systems  Constitutional:  Positive for  fatigue.  HENT:  Positive for mouth sores. Negative for mouth dryness and nose dryness.   Eyes:  Positive for visual disturbance. Negative for pain, itching and dryness.  Respiratory:  Negative for cough, hemoptysis, shortness of breath and difficulty breathing.   Cardiovascular:  Negative for chest pain, palpitations and swelling in legs/feet.  Gastrointestinal:  Negative for abdominal pain, blood in stool, constipation and diarrhea.  Endocrine: Negative for increased urination.  Genitourinary:  Negative for painful urination.  Musculoskeletal:  Positive for joint pain, joint pain and morning stiffness. Negative for joint swelling, myalgias, muscle weakness, muscle tenderness and myalgias.  Skin:  Negative for color change, rash and redness.  Allergic/Immunologic: Negative for susceptible to infections.  Neurological:  Negative for dizziness, numbness, headaches, memory loss and weakness.  Hematological:  Negative for swollen glands.  Psychiatric/Behavioral:  Negative for confusion and sleep disturbance.    PMFS History:  Patient Active Problem List   Diagnosis Date Noted   Rheumatoid factor positive 05/25/2021   Hepatoma ([place]) 04/05/2021   Thrombocytopenia ([place]) 04/05/2021   Other chest pain 04/05/2021   Arthralgia 04/05/2021   History of tobacco use 04/05/2021   Smoker 02/01/2021   Hepatic steatosis 02/01/2021   Age-related osteoporosis without current pathological fracture 10/02/2018   Achilles tendinitis of left lower extremity 02/24/2016   Left hand weakness 05/15/2014   Numbness and tingling in left arm 05/15/2014   Hepatitis C virus infection cured after antiviral drug therapy 05/15/2014    Past Medical History:  Diagnosis Date   Abnormal LFTs (liver function tests)    Cirrhosis ([place])    Hepatitis C virus infection    Patient stated she has been treated   Hepatitis C, chronic ([place]) 2004   genotype 1a   Hyperlipidemia    Medical history non-contributory     Thrombocytopenia ([place])     Family History  Problem Relation Age of Onset   Hypertension Mother    Diabetes Mother    Ovarian cancer Mother    Heart Problems Brother    Throat cancer Brother    Heart disease Maternal Grandmother    Heart disease Maternal Grandfather    Healthy Daughter    Colon cancer Neg Hx    Breast cancer Neg Hx    Past Surgical History:  Procedure Laterality Date   ABDOMINAL HYSTERECTOMY     APPENDECTOMY     COLONOSCOPY  2013   IR TRANSCATHETER BX  07/09/2019   IR US GUIDE VASC ACCESS RIGHT  07/09/2019   IR VENOGRAM HEPATIC W HEMODYNAMIC EVALUATION  07/09/2019   PARTIAL HYSTERECTOMY     Social History   Social History Narrative   Not on file   Immunization History  Administered Date(s) Administered   Influenza,inj,Quad PF,6+ Mos 07/31/2016, 08/03/2017   Influenza,inj,Quad PF,6-35 Mos 06/27/2019, 07/13/2020   Influenza,inj,quad, With Preservative 07/31/2016, 08/03/2017, 08/05/2018   Influenza-Unspecified 08/05/2018   PFIZER(Purple Top)SARS-COV-2 Vaccination 12/21/2019, 01/11/2020, 11/03/2020   Tdap 07/13/2020     Objective: Vital Signs: BP 120/75 (BP Location: Right Arm, Patient Position: Sitting, Cuff Size: Normal)   Pulse 77   Ht 5' 6"  (1.676 m)   Wt 140 lb (63.5 kg)   BMI 22.60 kg/m    Physical Exam HENT:     Mouth/Throat:     Mouth: Mucous membranes are moist.     Pharynx: Oropharynx is clear.     Comments: Several damaged teeth Eyes:     Conjunctiva/sclera: Conjunctivae normal.  Cardiovascular:     Rate and Rhythm: Normal rate and regular rhythm.  Pulmonary:     Effort: Pulmonary effort is normal.     Breath sounds: Normal breath sounds.  Skin:    General: Skin is warm and dry.     Findings: No rash.  Neurological:     General: No focal deficit present.     Mental Status: She is alert.  Psychiatric:        Mood and Affect: Mood normal.     Musculoskeletal Exam:  Shoulders full ROM no tenderness or swelling Elbows full  ROM no tenderness or swelling Wrists full ROM no tenderness or swelling Fingers full ROM no tenderness or swelling Knees full ROM no tenderness or swelling Ankles full ROM no tenderness or swelling No MTP squeeze tenderness   Investigation: No additional findings.  Imaging: XR Foot 2 Views Left  Result Date: 05/26/2021 X-ray left foot 2 views Normal tibiotalar joint space and alignment.  Normal-appearing midfoot joint spaces.  MTP joints appear well-preserved.  Probable remote medial sesamoid fracture without displacement.  No significant osteophytes or erosive changes are seen. Impression No significant inflammatory arthritis changes are seen  XR Foot 2 Views Right  Result Date: 05/26/2021 X-ray right foot 2 views Normal tibiotalar joint space and alignment.  Normal-appearing midfoot joint spaces.  MTP joints appear normal.  No significant osteophytes erosions or abnormal bone mineralization are seen. Impression No significant arthritis changes seen  XR Hand 2 View Left  Result Date: 05/26/2021 X-ray left hand 2 views Radiocarpal  joint space appears intact.  Multiple cystic changes present throughout carpal bones mild first CMC joint degenerative change.  MCP and PIP joint spaces are well-preserved.  No significant lateral osteophytes or periarticular erosions are seen.  Surgical hardware fixation present bridging the third fourth fifth DIP joints.  There is apparent generalized osteopenia. Impression Postsurgical changes with joint fixation in 3rd-5th DIPs, diffuse osteopenia but no erosion or focal mineralization, could represent longstanding inflammatory process or systemic osteopenia  XR Hand 2 View Right  Result Date: 05/26/2021 X-ray right hand 2 views Radiocarpal joint space appears intact.  Multiple cystic changes present throughout carpal bones especially visible in lunate.  Some slight and asymmetric narrowing in PIP and DIP joints without significant lateral osteophytes mild  deviation of third DIP.  There is generalized osteopenia. Impression Numerous cystic changes to be consistent with crystalline arthropathy or degenerative arthritis, diffuse osteopenia could be consistent with a longstanding inflammatory process or associated with systemic osteopenia   Recent Labs: Lab Results  Component Value Date   WBC 5.8 04/05/2021   HGB 11.6 04/05/2021   PLT 103 (L) 04/05/2021   NA 142 04/05/2021   K 4.5 04/05/2021   CL 103 04/05/2021   CO2 24 04/05/2021   GLUCOSE 92 04/05/2021   BUN 12 04/05/2021   CREATININE 0.87 04/05/2021   BILITOT 0.4 04/05/2021   ALKPHOS 125 (H) 04/05/2021   AST 34 04/05/2021   ALT 22 04/05/2021   PROT 8.0 04/05/2021   ALBUMIN 4.8 04/05/2021   CALCIUM 9.8 04/05/2021   GFRAA >60 07/09/2019    Speciality Comments: No specialty comments available.  Procedures:  No procedures performed Allergies: Hydrocodone, Penicillins, Percocet [oxycodone-acetaminophen], and Prednisone   Assessment / Plan:     Visit Diagnoses: Arthralgia, unspecified joint  Rheumatoid factor positive - Plan: XR Hand 2 View Right, XR Hand 2 View Left, XR Foot 2 Views Right, XR Foot 2 Views Left, C-reactive protein, Cryoglobulin  Rheumatoid factor is positive with joint pain in multiple sites though no synovitis appreciated on exam today.  We will recheck CRP for inflammatory markers.  Checking x-rays of bilateral hands and feet for evidence of erosion or demineralization.  If unremarkable work-up would consider ultrasound exam or other secondary causes.  Hepatitis C virus infection cured after antiviral drug therapy - Plan: C-reactive protein, Cryoglobulin  Chronic hepatitis C infection can be associated with positive rheumatoid factor usually in the setting of cryoglobulinemia can also develop persistent RF elevation with inflammatory arthritis so checking this as well.  Stable liver function patient is following up regularly with hepatology.  Treatment options would  potentially be limited by this medical history.  Orders: Orders Placed This Encounter  Procedures   XR Hand 2 View Right   XR Hand 2 View Left   XR Foot 2 Views Right   XR Foot 2 Views Left   C-reactive protein   Cryoglobulin    No orders of the defined types were placed in this encounter.    Follow-Up Instructions: Return in about 2 weeks (around 06/08/2021) for New pt ?RA f/u 2wks.   Collier Salina, MD  Note - This record has been created using [employer].  Chart creation errors have been sought, but may not always  have been located. Such creation errors do not reflect on  the standard of medical care.

## 2021-05-25 ENCOUNTER — Ambulatory Visit: Payer: Self-pay

## 2021-05-25 ENCOUNTER — Ambulatory Visit: Payer: Medicare Other | Admitting: Internal Medicine

## 2021-05-25 ENCOUNTER — Other Ambulatory Visit: Payer: Self-pay

## 2021-05-25 ENCOUNTER — Encounter: Payer: Self-pay | Admitting: Internal Medicine

## 2021-05-25 VITALS — BP 120/75 | HR 77 | Ht 66.0 in | Wt 140.0 lb

## 2021-05-25 DIAGNOSIS — R768 Other specified abnormal immunological findings in serum: Secondary | ICD-10-CM | POA: Diagnosis not present

## 2021-05-25 DIAGNOSIS — M79641 Pain in right hand: Secondary | ICD-10-CM

## 2021-05-25 DIAGNOSIS — M2550 Pain in unspecified joint: Secondary | ICD-10-CM

## 2021-05-25 DIAGNOSIS — M79642 Pain in left hand: Secondary | ICD-10-CM | POA: Diagnosis not present

## 2021-05-25 DIAGNOSIS — Z8619 Personal history of other infectious and parasitic diseases: Secondary | ICD-10-CM

## 2021-05-25 DIAGNOSIS — M79672 Pain in left foot: Secondary | ICD-10-CM

## 2021-05-25 DIAGNOSIS — M79671 Pain in right foot: Secondary | ICD-10-CM | POA: Diagnosis not present

## 2021-06-02 LAB — CRYOGLOBULIN: Cryoglobulin, Qualitative Analysis: NOT DETECTED

## 2021-06-02 LAB — C-REACTIVE PROTEIN: CRP: <0.2 mg/L (ref <8.0)

## 2021-06-03 DIAGNOSIS — K5900 Constipation, unspecified: Secondary | ICD-10-CM | POA: Diagnosis not present

## 2021-06-03 DIAGNOSIS — Z1211 Encounter for screening for malignant neoplasm of colon: Secondary | ICD-10-CM | POA: Diagnosis not present

## 2021-06-03 DIAGNOSIS — R1310 Dysphagia, unspecified: Secondary | ICD-10-CM | POA: Diagnosis not present

## 2021-06-03 NOTE — Progress Notes (Signed)
Blood tests show no particular evidence of current inflammation. Xrays show some wear and tear but no particular damage. Her bone density in the hands looks low, has she had previous bone density testing for osteoporosis?

## 2021-06-03 NOTE — Progress Notes (Signed)
Okay that would also explain it then, thanks. I'll just see her for f/u as planned seems more like a degenerative problem.

## 2021-06-07 ENCOUNTER — Other Ambulatory Visit: Payer: Self-pay | Admitting: Gastroenterology

## 2021-06-07 DIAGNOSIS — R1310 Dysphagia, unspecified: Secondary | ICD-10-CM

## 2021-06-08 NOTE — Progress Notes (Signed)
Office Visit Note  Patient: Taylor Santiago             Date of Birth: [birth year]           MRN: [id number]             PCP: Glendale Chard, MD Referring: Glendale Chard, MD Visit Date: 06/09/2021   Subjective:  Follow-up (Patient denies changes in symptoms.)   History of Present Illness: Taylor Santiago is a 65 y.o. female here for follow up with joint pain in multiple sites with positive RF. Initial visit findings negative for specific serology xrays without any erosive changes but recommend reassessment for inflammation and possible ultrasound inspection.  She continues having the same symptoms as before over the interval 2 weeks.  Previous HPI 05/25/21 [name] is a 65 y.o. female here for joint pain of multiple sites and positive rheumatoid factor. She has a history of previously treated HCV. Symptoms are chronic but increase during the past few months. She has some shoulder pain reaching high overhead and behind the back and hand stiffness but her complaint is mostly in her legs. She has all over pain for a few minutes in the morning this improves but worsens with prolonged use during the day. She has noticed occasional burning or sharp type pain in her feet and ankles that comes and goes. She sometimes notices increased heat at her feet but no redness or swelling. When symptoms are bothersome she has tried tylenol with no improvement and ibuprofen or aleve are helpful temporarily. Lab workup for these symptoms showed positive RF antibodies.    Labs reviewed 03/2021 ANA neg RF 29.9 CCP 8 ESR 14 CBC Plts 103 CMP alk phos 125 Uric acid 5.7     02/23/21 [place] RUQ Diffuse heterogeneous echogenicity of the hepatic parenchyma is a nonspecific indicator of hepatocellular dysfunction. No focal hepatic lesion.   Review of Systems  Constitutional:  Positive for fatigue.  HENT:  Positive for mouth sores. Negative for mouth dryness and nose dryness.   Eyes:  Negative for pain,  itching, visual disturbance and dryness.  Respiratory:  Positive for cough. Negative for hemoptysis, shortness of breath and difficulty breathing.   Cardiovascular:  Negative for chest pain, palpitations and swelling in legs/feet.  Gastrointestinal:  Negative for abdominal pain, blood in stool, constipation and diarrhea.  Endocrine: Negative for increased urination.  Genitourinary:  Negative for painful urination.  Musculoskeletal:  Positive for joint pain, joint pain, myalgias, muscle weakness, morning stiffness, muscle tenderness and myalgias. Negative for joint swelling.  Skin:  Negative for color change, rash and redness.  Allergic/Immunologic: Negative for susceptible to infections.  Neurological:  Positive for weakness. Negative for dizziness, numbness, headaches and memory loss.  Hematological:  Negative for swollen glands.  Psychiatric/Behavioral:  Negative for confusion and sleep disturbance.    PMFS History:  Patient Active Problem List   Diagnosis Date Noted   Osteoarthritis of hands, bilateral 06/09/2021   Rheumatoid factor positive 05/25/2021   Hepatoma ([place]) 04/05/2021   Thrombocytopenia ([place]) 04/05/2021   Other chest pain 04/05/2021   Arthralgia 04/05/2021   History of tobacco use 04/05/2021   Smoker 02/01/2021   Hepatic steatosis 02/01/2021   Age-related osteoporosis without current pathological fracture 10/02/2018   Achilles tendinitis of left lower extremity 02/24/2016   Left hand weakness 05/15/2014   Numbness and tingling in left arm 05/15/2014   Hepatitis C virus infection cured after antiviral drug therapy 05/15/2014    Past Medical  History:  Diagnosis Date   Abnormal LFTs (liver function tests)    Cirrhosis ([place])    Hepatitis C virus infection    Patient stated she has been treated   Hepatitis C, chronic ([place]) 2004   genotype 1a   Hyperlipidemia    Medical history non-contributory    Thrombocytopenia ([place])     Family History  Problem Relation Age of  Onset   Hypertension Mother    Diabetes Mother    Ovarian cancer Mother    Heart Problems Brother    Throat cancer Brother    Heart disease Maternal Grandmother    Heart disease Maternal Grandfather    Healthy Daughter    Colon cancer Neg Hx    Breast cancer Neg Hx    Past Surgical History:  Procedure Laterality Date   ABDOMINAL HYSTERECTOMY     APPENDECTOMY     COLONOSCOPY  2013   IR TRANSCATHETER BX  07/09/2019   IR US GUIDE VASC ACCESS RIGHT  07/09/2019   IR VENOGRAM HEPATIC W HEMODYNAMIC EVALUATION  07/09/2019   PARTIAL HYSTERECTOMY     Social History   Social History Narrative   Not on file   Immunization History  Administered Date(s) Administered   Influenza,inj,Quad PF,6+ Mos 07/31/2016, 08/03/2017   Influenza,inj,Quad PF,6-35 Mos 06/27/2019, 07/13/2020   Influenza,inj,quad, With Preservative 07/31/2016, 08/03/2017, 08/05/2018   Influenza-Unspecified 08/05/2018   PFIZER(Purple Top)SARS-COV-2 Vaccination 12/21/2019, 01/11/2020, 11/03/2020   Tdap 07/13/2020     Objective: Vital Signs: BP 107/71 (BP Location: Left Arm, Patient Position: Sitting, Cuff Size: Normal)   Pulse 85   Ht _0  (1.676 m)   Wt 140 lb 6.4 oz (63.7 kg)   BMI 22.66 kg/m    Physical Exam Skin:    General: Skin is warm and dry.     Findings: No rash.  Neurological:     Mental Status: She is alert.  Psychiatric:        Mood and Affect: Mood normal.     Musculoskeletal Exam:  Elbows full ROM no tenderness or swelling Wrists full ROM no tenderness or swelling Left distal fingertips 1 fused other slightly decreased DIP joint range of motion no tenderness or swelling bony nodule present on the extensor surface of first IP joint, right hand also bony nodule on extensor surface of first IP joint other joints appear normal Limited ultrasound inspection of the wrist and MCP joints of bilateral hands without synovitis or color Doppler enhancement, median nerve cross-sectional area 8 to 9 mm  bilaterally Knees full ROM no tenderness or swelling    Investigation: No additional findings.  Imaging: XR Foot 2 Views Left  Result Date: 05/26/2021 X-ray left foot 2 views Normal tibiotalar joint space and alignment.  Normal-appearing midfoot joint spaces.  MTP joints appear well-preserved.  Probable remote medial sesamoid fracture without displacement.  No significant osteophytes or erosive changes are seen. Impression No significant inflammatory arthritis changes are seen  XR Foot 2 Views Right  Result Date: 05/26/2021 X-ray right foot 2 views Normal tibiotalar joint space and alignment.  Normal-appearing midfoot joint spaces.  MTP joints appear normal.  No significant osteophytes erosions or abnormal bone mineralization are seen. Impression No significant arthritis changes seen  XR Hand 2 View Left  Result Date: 05/26/2021 X-ray left hand 2 views Radiocarpal joint space appears intact.  Multiple cystic changes present throughout carpal bones mild first CMC joint degenerative change.  MCP and PIP joint spaces are well-preserved.  No significant lateral osteophytes or  periarticular erosions are seen.  Surgical hardware fixation present bridging the third fourth fifth DIP joints.  There is apparent generalized osteopenia. Impression Postsurgical changes with joint fixation in 3rd-5th DIPs, diffuse osteopenia but no erosion or focal mineralization, could represent longstanding inflammatory process or systemic osteopenia  XR Hand 2 View Right  Result Date: 05/26/2021 X-ray right hand 2 views Radiocarpal joint space appears intact.  Multiple cystic changes present throughout carpal bones especially visible in lunate.  Some slight and asymmetric narrowing in PIP and DIP joints without significant lateral osteophytes mild deviation of third DIP.  There is generalized osteopenia. Impression Numerous cystic changes to be consistent with crystalline arthropathy or degenerative arthritis, diffuse  osteopenia could be consistent with a longstanding inflammatory process or associated with systemic osteopenia   Recent Labs: Lab Results  Component Value Date   WBC 5.8 04/05/2021   HGB 11.6 04/05/2021   PLT 103 (L) 04/05/2021   NA 142 04/05/2021   K 4.5 04/05/2021   CL 103 04/05/2021   CO2 24 04/05/2021   GLUCOSE 92 04/05/2021   BUN 12 04/05/2021   CREATININE 0.87 04/05/2021   BILITOT 0.4 04/05/2021   ALKPHOS 125 (H) 04/05/2021   AST 34 04/05/2021   ALT 22 04/05/2021   PROT 8.0 04/05/2021   ALBUMIN 4.8 04/05/2021   CALCIUM 9.8 04/05/2021   GFRAA >60 07/09/2019    Speciality Comments: No specialty comments available.  Procedures:  No procedures performed Allergies: Hydrocodone, Penicillins, Percocet [oxycodone-acetaminophen], and Prednisone   Assessment / Plan:     Visit Diagnoses: Osteoarthritis of both hands, unspecified osteoarthritis type  At this time findings are most consistent with osteoarthritis of the bilateral hands without any active inflammatory process.  The generalized osteopenia on x-rays most likely related to her overall osteoporosis.  There could be some secondary disease with history of tobacco use history of past hep C infection but no evidence of current.  Discussed treatment options including judicious use of as needed oral NSAID medication or topical NSAID medication, trial use of compressive or supportive soft glove or wrist bracing particularly overnight, discussed if symptoms worsen significantly could take another look or consider evaluation with occupational therapy but no further treatments today can follow-up as needed.  Orders: No orders of the defined types were placed in this encounter.  No orders of the defined types were placed in this encounter.    Follow-Up Instructions: Return if symptoms worsen or fail to improve.   Collier Salina, MD  Note - This record has been created using [employer].  Chart creation errors have  been sought, but may not always  have been located. Such creation errors do not reflect on  the standard of medical care.

## 2021-06-09 ENCOUNTER — Encounter: Payer: Self-pay | Admitting: Internal Medicine

## 2021-06-09 ENCOUNTER — Other Ambulatory Visit: Payer: Self-pay

## 2021-06-09 ENCOUNTER — Ambulatory Visit (INDEPENDENT_AMBULATORY_CARE_PROVIDER_SITE_OTHER): Payer: Medicare Other | Admitting: Internal Medicine

## 2021-06-09 DIAGNOSIS — M19041 Primary osteoarthritis, right hand: Secondary | ICD-10-CM | POA: Diagnosis not present

## 2021-06-09 DIAGNOSIS — M19042 Primary osteoarthritis, left hand: Secondary | ICD-10-CM | POA: Diagnosis not present

## 2021-06-28 ENCOUNTER — Ambulatory Visit
Admission: RE | Admit: 2021-06-28 | Discharge: 2021-06-28 | Disposition: A | Discharge status: Home / Self Care | Payer: Medicare Other | Source: Ambulatory Visit | Attending: Gastroenterology | Admitting: Gastroenterology

## 2021-06-28 DIAGNOSIS — R1310 Dysphagia, unspecified: Secondary | ICD-10-CM

## 2021-06-28 DIAGNOSIS — R0989 Other specified symptoms and signs involving the circulatory and respiratory systems: Secondary | ICD-10-CM | POA: Diagnosis not present

## 2021-07-02 ENCOUNTER — Ambulatory Visit
Admission: RE | Admit: 2021-07-02 | Discharge: 2021-07-02 | Disposition: A | Discharge status: Home / Self Care | Payer: Medicare Other | Source: Ambulatory Visit | Attending: Internal Medicine | Admitting: Internal Medicine

## 2021-07-02 ENCOUNTER — Other Ambulatory Visit: Payer: Self-pay

## 2021-07-02 DIAGNOSIS — Z0000 Encounter for general adult medical examination without abnormal findings: Secondary | ICD-10-CM

## 2021-07-02 DIAGNOSIS — Z1231 Encounter for screening mammogram for malignant neoplasm of breast: Secondary | ICD-10-CM | POA: Diagnosis not present

## 2021-08-18 ENCOUNTER — Other Ambulatory Visit: Payer: Self-pay | Admitting: Nurse Practitioner

## 2021-08-18 DIAGNOSIS — Z8619 Personal history of other infectious and parasitic diseases: Secondary | ICD-10-CM | POA: Diagnosis not present

## 2021-08-18 DIAGNOSIS — K7400 Hepatic fibrosis, unspecified: Secondary | ICD-10-CM | POA: Diagnosis not present

## 2021-08-18 DIAGNOSIS — K7402 Hepatic fibrosis, advanced fibrosis: Secondary | ICD-10-CM | POA: Diagnosis not present

## 2021-08-18 DIAGNOSIS — K760 Fatty (change of) liver, not elsewhere classified: Secondary | ICD-10-CM

## 2021-08-23 ENCOUNTER — Encounter: Payer: Self-pay | Admitting: Internal Medicine

## 2021-08-23 DIAGNOSIS — K5730 Diverticulosis of large intestine without perforation or abscess without bleeding: Secondary | ICD-10-CM | POA: Diagnosis not present

## 2021-08-23 DIAGNOSIS — D120 Benign neoplasm of cecum: Secondary | ICD-10-CM | POA: Diagnosis not present

## 2021-08-23 DIAGNOSIS — K635 Polyp of colon: Secondary | ICD-10-CM | POA: Diagnosis not present

## 2021-08-23 DIAGNOSIS — K6389 Other specified diseases of intestine: Secondary | ICD-10-CM | POA: Diagnosis not present

## 2021-08-23 DIAGNOSIS — Z1211 Encounter for screening for malignant neoplasm of colon: Secondary | ICD-10-CM | POA: Diagnosis not present

## 2021-08-23 LAB — HM COLONOSCOPY

## 2021-09-03 ENCOUNTER — Ambulatory Visit (HOSPITAL_COMMUNITY)
Admission: EM | Admit: 2021-09-03 | Discharge: 2021-09-03 | Disposition: A | Discharge status: Home / Self Care | Payer: Medicare Other | Attending: Emergency Medicine | Admitting: Emergency Medicine

## 2021-09-03 ENCOUNTER — Encounter (HOSPITAL_COMMUNITY): Payer: Self-pay | Admitting: Emergency Medicine

## 2021-09-03 ENCOUNTER — Other Ambulatory Visit: Payer: Self-pay

## 2021-09-03 DIAGNOSIS — Z20822 Contact with and (suspected) exposure to covid-19: Secondary | ICD-10-CM | POA: Diagnosis not present

## 2021-09-03 DIAGNOSIS — J101 Influenza due to other identified influenza virus with other respiratory manifestations: Secondary | ICD-10-CM | POA: Insufficient documentation

## 2021-09-03 LAB — POC INFLUENZA A AND B ANTIGEN (URGENT CARE ONLY)
INFLUENZA A ANTIGEN, POC: POSITIVE — AB
INFLUENZA B ANTIGEN, POC: NEGATIVE

## 2021-09-03 LAB — SARS CORONAVIRUS 2 (TAT 6-24 HRS): SARS Coronavirus 2: NEGATIVE

## 2021-09-03 MED ORDER — OSELTAMIVIR PHOSPHATE 75 MG PO CAPS: 75.0000 mg | ORAL_CAPSULE | Freq: Two times a day (BID) | ORAL | 0 refills | Status: DC

## 2021-09-03 NOTE — Discharge Instructions (Addendum)
Your symptoms are most consistent with a viral upper respiratory illness.  Rapid influenza testing today was positive for influenza A.  Because of this, COVID testing is not needed.  Current guidelines recommend that even though its been 6 days since she began feeling ill that you begin taking Tamiflu to prevent spread of the virus in the community.  I presented prescription to your pharmacy, please take 1 tablet twice daily for the next 5 days.  Please remain home from work, school, public places until you have been fever free for 24 hours without the use of antifever medications such as Tylenol or ibuprofen.  Conservative care is recommended at this time.  This includes rest, pushing clear fluids and activity as tolerated.  You may also noticed that your appetite is reduced, this is okay as long as they are drinking plenty of clear fluids.  Acetaminophen (Tylenol): This is a good fever reducer.  If there body temperature rises above 101.5 as measured with a thermometer, it is recommended that you give them 1,000 mg every 6-8 hours until they are temperature falls below 101.5, please not take more than 3,000 mg of acetaminophen either as a separate medication or as in ingredient in an over-the-counter cold/flu preparation within a 24-hour period  Ibuprofen  (Advil, Motrin): This is a good anti-inflammatory medication which addresses aches and pains and, to some degree, congestion in the nasal passages.  I recommend giving between 400 to 600 mg every 6-8 hours as needed.  Pseudoephedrine (Sudafed): This is a decongestant.  This medication has to be purchased from the pharmacist counter, I recommend giving 2 tablets, 60 mg, 2-3 times a day as needed to relieve runny nose and sinus drainage.  Guaifenesin (Robitussin, Mucinex): This is an expectorant.  This helps break up chest congestion and loosen up thick nasal drainage making phlegm and drainage more liquid and therefore easier to remove.  I recommend  being 400 mg three times daily as needed.  Dextromethorphan (any cough medicine with the letters "DM" added to it's name such as Robitussin DM): This is a cough suppressant.  This is often recommended to be taken at nighttime to suppress cough and help children sleep.  Give dosage as directed on the bottle.   Chloraseptic Throat Spray: Spray 5 sprays into affected area every 2 hours, hold for 15 seconds and either swallow or spit it out.  This is a excellent numbing medication because it is a spray, you can put it right where you needed and so sucking on a lozenge and numbing your entire mouth.  Based on my physical exam findings and the history provided  today, I do not see any evidence of bacterial infection therefore treatment with antibiotics would be of no benefit.  Please follow-up within the next 3 to 5 days either with your primary care provider or urgent care if your symptoms do not resolve.  If you do not have a primary care provider, we will assist you in finding one.

## 2021-09-03 NOTE — ED Provider Notes (Addendum)
[place]    CSN: [id number] Arrival date & time: 09/03/21  0932    HISTORY   Chief Complaint  Patient presents with   Cough   Fever   Sore Throat   Nasal Congestion   HPI Taylor Santiago is a 65 y.o. female. Patient reports a 6-day history of cough, sore throat, fever, nasal congestion and chills.  Patient states that overall today, she is feeling better.  Patient states she initially had a significant amount of phlegm that she spent a lot of time and effort coughing up, states now she just has a lot of soreness around her chest and upper back from the coughing.  She states she has been going to work, riding public transportation to get there, spent Thanksgiving her family.  The history is provided by the patient.  Past Medical History:  Diagnosis Date   Abnormal LFTs (liver function tests)    Cirrhosis ([place])    Hepatitis C virus infection    Patient stated she has been treated   Hepatitis C, chronic ([place]) 2004   genotype 1a   Hyperlipidemia    Medical history non-contributory    Thrombocytopenia [facility])    Patient Active Problem List   Diagnosis Date Noted   Osteoarthritis of hands, bilateral 06/09/2021   Rheumatoid factor positive 05/25/2021   Hepatoma ([place]) 04/05/2021   Thrombocytopenia ([place]) 04/05/2021   Other chest pain 04/05/2021   Arthralgia 04/05/2021   History of tobacco use 04/05/2021   Smoker 02/01/2021   Hepatic steatosis 02/01/2021   Age-related osteoporosis without current pathological fracture 10/02/2018   Achilles tendinitis of left lower extremity 02/24/2016   Left hand weakness 05/15/2014   Numbness and tingling in left arm 05/15/2014   Hepatitis C virus infection cured after antiviral drug therapy 05/15/2014   Past Surgical History:  Procedure Laterality Date   ABDOMINAL HYSTERECTOMY     APPENDECTOMY     COLONOSCOPY  2013   IR TRANSCATHETER BX  07/09/2019   IR US GUIDE VASC ACCESS RIGHT  07/09/2019   IR VENOGRAM HEPATIC W  HEMODYNAMIC EVALUATION  07/09/2019   PARTIAL HYSTERECTOMY     OB History     Gravida  1   Para  1   Term      Preterm      AB      Living  1      SAB      IAB      Ectopic      Multiple      Live Births             Home Medications    Prior to Admission medications   Medication Sig Start Date End Date Taking? Authorizing Provider  oseltamivir (TAMIFLU) 75 MG capsule Take 1 capsule (75 mg total) by mouth every 12 (twelve) hours. 09/03/21  Yes Lynden Oxford Scales, PA-C  ibuprofen (ADVIL,MOTRIN) 200 MG tablet Take 200 mg by mouth every 6 (six) hours as needed.    [provider]   Family History Family History  Problem Relation Age of Onset   Hypertension Mother    Diabetes Mother    Ovarian cancer Mother    Heart Problems Brother    Throat cancer Brother    Heart disease Maternal Grandmother    Heart disease Maternal Grandfather    Healthy Daughter    Colon cancer Neg Hx    Breast cancer Neg Hx    Social History Social History   Tobacco  Use   Smoking status: Former    Packs/day: 0.50    Years: 26.00    Pack years: 13.00    Types: Cigarettes    Start date: 1974    Quit date: 2000    Years since quitting: 22.9   Smokeless tobacco: Never  Vaping Use   Vaping Use: Never used  Substance Use Topics   Alcohol use: Yes    Comment: Occasionally   Drug use: No   Allergies   Hydrocodone, Penicillins, Percocet [oxycodone-acetaminophen], and Prednisone  Review of Systems Review of Systems Pertinent findings noted in history of present illness.   Physical Exam Triage Vital Signs ED Triage Vitals  Enc Vitals Group     BP 08/06/21 0827 (!) 147/82     Pulse Rate 08/06/21 0827 72     Resp 08/06/21 0827 18     Temp 08/06/21 0827 98.3 F (36.8 C)     Temp Source 08/06/21 0827 Oral     SpO2 08/06/21 0827 98 %     Weight --      Height --      Head Circumference --      Peak Flow --      Pain Score 08/06/21 0826 5     Pain Loc --       Pain Edu? --      Excl. in [place]? --   No data found.  Updated Vital Signs BP 121/86   Pulse 70   Temp (!) 97.3 F (36.3 C)   Resp 18   SpO2 99%   Physical Exam Vitals and nursing note reviewed.  Constitutional:      General: She is not in acute distress.    Appearance: Normal appearance. She is not ill-appearing.  HENT:     Head: Normocephalic and atraumatic.     Salivary Glands: Right salivary gland is not diffusely enlarged or tender. Left salivary gland is not diffusely enlarged or tender.     Right Ear: Tympanic membrane, ear canal and external ear normal. No drainage. No middle ear effusion. There is no impacted cerumen. Tympanic membrane is not erythematous or bulging.     Left Ear: Tympanic membrane, ear canal and external ear normal. No drainage.  No middle ear effusion. There is no impacted cerumen. Tympanic membrane is not erythematous or bulging.     Nose: Nose normal. No nasal deformity, septal deviation, mucosal edema, congestion or rhinorrhea.     Right Turbinates: Not enlarged, swollen or pale.     Left Turbinates: Not enlarged, swollen or pale.     Right Sinus: No maxillary sinus tenderness or frontal sinus tenderness.     Left Sinus: No maxillary sinus tenderness or frontal sinus tenderness.     Mouth/Throat:     Lips: Pink. No lesions.     Mouth: Mucous membranes are moist. No oral lesions.     Pharynx: Oropharynx is clear. Uvula midline. No posterior oropharyngeal erythema or uvula swelling.     Tonsils: No tonsillar exudate. 0 on the right. 0 on the left.  Eyes:     General: Lids are normal.        Right eye: No discharge.        Left eye: No discharge.     Extraocular Movements: Extraocular movements intact.     Conjunctiva/sclera: Conjunctivae normal.     Right eye: Right conjunctiva is not injected.     Left eye: Left conjunctiva is not injected.  Neck:  Trachea: Trachea and phonation normal.  Cardiovascular:     Rate and Rhythm: Normal rate and  regular rhythm.     Pulses: Normal pulses.     Heart sounds: Normal heart sounds. No murmur heard.   No friction rub. No gallop.  Pulmonary:     Effort: Pulmonary effort is normal. No accessory muscle usage, prolonged expiration or respiratory distress.     Breath sounds: Normal breath sounds. No stridor, decreased air movement or transmitted upper airway sounds. No decreased breath sounds, wheezing, rhonchi or rales.  Chest:     Chest wall: No tenderness.  Musculoskeletal:        General: Normal range of motion.     Cervical back: Normal range of motion and neck supple. Normal range of motion.  Lymphadenopathy:     Cervical: No cervical adenopathy.  Skin:    General: Skin is warm and dry.     Findings: No erythema or rash.  Neurological:     General: No focal deficit present.     Mental Status: She is alert and oriented to person, place, and time.  Psychiatric:        Mood and Affect: Mood normal.        Behavior: Behavior normal.    Visual Acuity Right Eye Distance:   Left Eye Distance:   Bilateral Distance:    Right Eye Near:   Left Eye Near:    Bilateral Near:     UC Couse / Diagnostics / Procedures:    EKG  Radiology No results found.  Procedures Procedures (including critical care time)  UC Diagnoses / Final Clinical Impressions(s)    Final diagnoses:  Influenza A   I have reviewed the triage vital signs and the nursing notes.  Pertinent labs & imaging results that were available during my care of the patient were reviewed by me and considered in my medical decision making (see chart for details).    Patient presents today with symptoms consistent with viral upper respiratory infection.  Rapid influenza test today was also due for influenza A.  Patient provided with prescription for Tamiflu.  Conservative care recommended.  Prior to discharge, patient requested COVID test, patient advised that we will notify her of the results once  received.  Patient/parent/caregiver verbalized understanding and agreement of plan as discussed.  All questions were addressed during visit.  Please see discharge instructions below for further details of plan.  ED Prescriptions     Medication Sig Dispense Auth. Provider   oseltamivir (TAMIFLU) 75 MG capsule Take 1 capsule (75 mg total) by mouth every 12 (twelve) hours. 10 capsule Lynden Oxford Scales, PA-C      PDMP not reviewed this encounter.  Pending results:  Labs Reviewed  POC INFLUENZA A AND B ANTIGEN (URGENT CARE ONLY) - Abnormal; Notable for the following components:      Result Value   INFLUENZA A ANTIGEN, POC POSITIVE (*)    All other components within normal limits     Medications Ordered in UC: Medications - No data to display  Discharge Instructions:   Discharge Instructions      Your symptoms are most consistent with a viral upper respiratory illness.  Rapid influenza testing today was positive for influenza A.  Because of this, COVID testing is not needed.  Current guidelines recommend that even though its been 6 days since she began feeling ill that you begin taking Tamiflu to prevent spread of the virus in the community.  I presented  prescription to your pharmacy, please take 1 tablet twice daily for the next 5 days.  Please remain home from work, school, public places until you have been fever free for 24 hours without the use of antifever medications such as Tylenol or ibuprofen.  Conservative care is recommended at this time.  This includes rest, pushing clear fluids and activity as tolerated.  You may also noticed that your appetite is reduced, this is okay as long as they are drinking plenty of clear fluids.  Acetaminophen (Tylenol): This is a good fever reducer.  If there body temperature rises above 101.5 as measured with a thermometer, it is recommended that you give them 1,000 mg every 6-8 hours until they are temperature falls below 101.5, please not  take more than 3,000 mg of acetaminophen either as a separate medication or as in ingredient in an over-the-counter cold/flu preparation within a 24-hour period  Ibuprofen  (Advil, Motrin): This is a good anti-inflammatory medication which addresses aches and pains and, to some degree, congestion in the nasal passages.  I recommend giving between 400 to 600 mg every 6-8 hours as needed.  Pseudoephedrine (Sudafed): This is a decongestant.  This medication has to be purchased from the pharmacist counter, I recommend giving 2 tablets, 60 mg, 2-3 times a day as needed to relieve runny nose and sinus drainage.  Guaifenesin (Robitussin, Mucinex): This is an expectorant.  This helps break up chest congestion and loosen up thick nasal drainage making phlegm and drainage more liquid and therefore easier to remove.  I recommend being 400 mg three times daily as needed.  Dextromethorphan (any cough medicine with the letters "DM" added to it's name such as Robitussin DM): This is a cough suppressant.  This is often recommended to be taken at nighttime to suppress cough and help children sleep.  Give dosage as directed on the bottle.   Chloraseptic Throat Spray: Spray 5 sprays into affected area every 2 hours, hold for 15 seconds and either swallow or spit it out.  This is a excellent numbing medication because it is a spray, you can put it right where you needed and so sucking on a lozenge and numbing your entire mouth.  Based on my physical exam findings and the history provided  today, I do not see any evidence of bacterial infection therefore treatment with antibiotics would be of no benefit.  Please follow-up within the next 3 to 5 days either with your primary care provider or urgent care if your symptoms do not resolve.  If you do not have a primary care provider, we will assist you in finding one.       Disposition Upon Discharge:  Patient presented with an acute illness with associated systemic  symptoms and significant discomfort requiring urgent management. In my opinion, this is a condition that a prudent lay person (someone who possesses an average knowledge of health and medicine) may potentially expect to result in complications if not addressed urgently such as respiratory distress, impairment of bodily function or dysfunction of bodily organs.   Routine symptom specific, illness specific and/or disease specific instructions were discussed with the patient and/or caregiver at length.   As such, the patient has been evaluated and assessed, work-up was performed and treatment was provided in alignment with urgent care protocols and evidence based medicine.  Patient/parent/caregiver has been advised that the patient may require follow up for further testing and treatment if the symptoms continue in spite of treatment, as clinically indicated  and appropriate.  The patient was tested for COVID-19, Influenza and/or RSV, then the patient/parent/guardian was advised to isolate at home pending the results of his/her diagnostic coronavirus test and potentially longer if they're positive. I have also advised pt that if his/her COVID-19 test returns positive, it's recommended to self-isolate for at least 10 days after symptoms first appeared AND until fever-free for 24 hours without fever reducer AND other symptoms have improved or resolved. Discussed self-isolation recommendations as well as instructions for household member/close contacts as per the [facility] and [place] DHHS, and also gave patient the [place] packet with this information.  Patient/parent/caregiver has been advised to return to the [facility] or PCP in 3-5 days if no better; to PCP or the Emergency Department if new signs and symptoms develop, or if the current signs or symptoms continue to change or worsen for further workup, evaluation and treatment as clinically indicated and appropriate  The patient will follow up with their current PCP if and as  advised. If the patient does not currently have a PCP we will assist them in obtaining one.   The patient may need specialty follow up if the symptoms continue, in spite of conservative treatment and management, for further workup, evaluation, consultation and treatment as clinically indicated and appropriate.  Condition: stable for discharge home Home: take medications as prescribed; routine discharge instructions as discussed; follow up as advised.    Lynden Oxford Scales, PA-C 09/03/21 0932    Lynden Oxford Scales, PA-C 09/03/21 [phone]

## 2021-09-03 NOTE — ED Triage Notes (Signed)
Pt is present today with cough, sore throat, fever, nasal congestion, and chills. Pt sx started x6 days

## 2021-09-13 ENCOUNTER — Emergency Department (HOSPITAL_COMMUNITY)
Admission: EM | Admit: 2021-09-13 | Discharge: 2021-09-13 | Disposition: A | Discharge status: Home / Self Care | Payer: Medicare Other | Attending: Emergency Medicine | Admitting: Emergency Medicine

## 2021-09-13 ENCOUNTER — Other Ambulatory Visit: Payer: Self-pay

## 2021-09-13 ENCOUNTER — Encounter (HOSPITAL_COMMUNITY): Payer: Self-pay | Admitting: *Deleted

## 2021-09-13 ENCOUNTER — Emergency Department (HOSPITAL_COMMUNITY): Payer: Medicare Other

## 2021-09-13 DIAGNOSIS — I700 Atherosclerosis of aorta: Secondary | ICD-10-CM | POA: Diagnosis not present

## 2021-09-13 DIAGNOSIS — Y92410 Unspecified street and highway as the place of occurrence of the external cause: Secondary | ICD-10-CM | POA: Insufficient documentation

## 2021-09-13 DIAGNOSIS — Z743 Need for continuous supervision: Secondary | ICD-10-CM | POA: Diagnosis not present

## 2021-09-13 DIAGNOSIS — I499 Cardiac arrhythmia, unspecified: Secondary | ICD-10-CM | POA: Diagnosis not present

## 2021-09-13 DIAGNOSIS — T07XXXA Unspecified multiple injuries, initial encounter: Secondary | ICD-10-CM | POA: Diagnosis not present

## 2021-09-13 DIAGNOSIS — R109 Unspecified abdominal pain: Secondary | ICD-10-CM | POA: Diagnosis not present

## 2021-09-13 DIAGNOSIS — S0232XA Fracture of orbital floor, left side, initial encounter for closed fracture: Secondary | ICD-10-CM | POA: Diagnosis not present

## 2021-09-13 DIAGNOSIS — S20211A Contusion of right front wall of thorax, initial encounter: Secondary | ICD-10-CM | POA: Diagnosis not present

## 2021-09-13 DIAGNOSIS — Z041 Encounter for examination and observation following transport accident: Secondary | ICD-10-CM | POA: Diagnosis not present

## 2021-09-13 DIAGNOSIS — Z87891 Personal history of nicotine dependence: Secondary | ICD-10-CM | POA: Diagnosis not present

## 2021-09-13 DIAGNOSIS — R519 Headache, unspecified: Secondary | ICD-10-CM | POA: Diagnosis not present

## 2021-09-13 DIAGNOSIS — S299XXA Unspecified injury of thorax, initial encounter: Secondary | ICD-10-CM | POA: Diagnosis present

## 2021-09-13 MED ORDER — METHOCARBAMOL 500 MG PO TABS: 500.0000 mg | ORAL_TABLET | Freq: Two times a day (BID) | ORAL | 0 refills | Status: DC

## 2021-09-13 MED ORDER — IBUPROFEN 800 MG PO TABS: 800.0000 mg | ORAL_TABLET | Freq: Three times a day (TID) | ORAL | 0 refills | Status: DC
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
## 2021-09-13 NOTE — Discharge Instructions (Addendum)
Please use Tylenol or ibuprofen for pain.  You may use 600 mg ibuprofen every 6 hours or 1000 mg of Tylenol every 6 hours.  You may choose to alternate between the 2.  This would be most effective.  Not to exceed 4 g of Tylenol within 24 hours.  Not to exceed 3200 mg ibuprofen 24 hours.  You can use the robaxin as needed for breakthrough pain. It may make you sleepy, be cautious using it before driving.
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
## 2021-09-13 NOTE — ED Notes (Signed)
Pt teaching provided on medications that may cause drowsiness. Pt instructed not to drive or operate heavy machinery while taking the prescribed medication. Pt verbalized understanding.  ? ?Pt provided discharge instructions and prescription information. Pt was given the opportunity to ask questions and questions were answered. Discharge signature not obtained in the setting of the COVID-19 pandemic in order to reduce high touch surfaces.  ? ?

## 2021-09-30 ENCOUNTER — Telehealth: Payer: Self-pay
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
## 2021-10-13 ENCOUNTER — Ambulatory Visit
Admission: RE | Admit: 2021-10-13 | Discharge: 2021-10-13 | Disposition: A | Discharge status: Home / Self Care | Payer: Medicare Other | Source: Ambulatory Visit | Attending: Nurse Practitioner | Admitting: Nurse Practitioner

## 2021-10-13 DIAGNOSIS — K760 Fatty (change of) liver, not elsewhere classified: Secondary | ICD-10-CM

## 2021-10-13 DIAGNOSIS — K7400 Hepatic fibrosis, unspecified: Secondary | ICD-10-CM

## 2021-10-13 DIAGNOSIS — K7581 Nonalcoholic steatohepatitis (NASH): Secondary | ICD-10-CM | POA: Diagnosis not present

## 2021-11-04 ENCOUNTER — Ambulatory Visit
Admission: RE | Admit: 2021-11-04 | Discharge: 2021-11-04 | Disposition: A | Discharge status: Home / Self Care | Payer: Medicare Other | Source: Ambulatory Visit | Attending: Internal Medicine | Admitting: Internal Medicine

## 2021-11-04 DIAGNOSIS — Z780 Asymptomatic menopausal state: Secondary | ICD-10-CM | POA: Diagnosis not present

## 2021-11-04 DIAGNOSIS — E2839 Other primary ovarian failure: Secondary | ICD-10-CM

## 2021-11-04 DIAGNOSIS — M8589 Other specified disorders of bone density and structure, multiple sites: Secondary | ICD-10-CM | POA: Diagnosis not present

## 2021-12-15 ENCOUNTER — Encounter (HOSPITAL_COMMUNITY): Payer: Self-pay

## 2021-12-15 ENCOUNTER — Other Ambulatory Visit: Payer: Self-pay

## 2021-12-15 ENCOUNTER — Ambulatory Visit (HOSPITAL_COMMUNITY)
Admission: EM | Admit: 2021-12-15 | Discharge: 2021-12-15 | Disposition: A | Discharge status: Home / Self Care | Payer: Medicare Other | Attending: Nurse Practitioner | Admitting: Nurse Practitioner

## 2021-12-15 DIAGNOSIS — R509 Fever, unspecified: Secondary | ICD-10-CM | POA: Insufficient documentation

## 2021-12-15 DIAGNOSIS — U071 COVID-19: Secondary | ICD-10-CM | POA: Diagnosis not present

## 2021-12-15 DIAGNOSIS — J111 Influenza due to unidentified influenza virus with other respiratory manifestations: Secondary | ICD-10-CM | POA: Diagnosis not present

## 2021-12-15 DIAGNOSIS — J069 Acute upper respiratory infection, unspecified: Secondary | ICD-10-CM | POA: Insufficient documentation

## 2021-12-15 LAB — POC INFLUENZA A AND B ANTIGEN (URGENT CARE ONLY)
INFLUENZA A ANTIGEN, POC: NEGATIVE
INFLUENZA B ANTIGEN, POC: NEGATIVE

## 2021-12-15 NOTE — ED Triage Notes (Signed)
Pt c/o headache, body aches, lack of taste, and n/v for 2-3 days. ?

## 2021-12-15 NOTE — Discharge Instructions (Addendum)
We will let you know with any positive results.  If your symptoms last longer than 10 days and/or you start feeling worse with facial pain, high fever, cough, shortness of breath or start feeling significantly worse, please reach out to your primary care provider for further evaluation or seek urgent care. ?Some things that can make you feel better are: ?- Increased rest ?- Increasing fluid with water/sugar free electrolytes ?- Acetaminophen as needed for fever/pain.  ?- Salt water gargling, chloraseptic spray and throat lozenges ?- OTC guaifenesin (Mucinex).  ?- Saline sinus flushes or a neti pot.  ?- Humidifying the air. ? ?

## 2021-12-15 NOTE — ED Provider Notes (Signed)
[place]    CSN: [id number] Arrival date & time: 12/15/21  1412      History   Chief Complaint Chief Complaint  Patient presents with   Nausea   Generalized Body Aches   Headache    HPI Taylor Santiago is a 66 y.o. female.   Patient reports 3-day history of low-grade fever, body aches, chills, congestion and cough, chest pain at times with coughing, headache, swollen glands, 1 day of vomiting, loss of taste, fatigue and diarrhea every morning when she wakes up.  She reports her appetite is decreased, however she is trying to drink plenty of fluids.  She denies any shortness of breath, wheezing tooth pain, ear pain, and any new rash.  She has been taking DayQuil, NyQuil, other over-the-counter medications without relief of symptoms.  She is worried because her friends tell her she may have COVID or pneumonia.     Past Medical History:  Diagnosis Date   Abnormal LFTs (liver function tests)    Cirrhosis ([place])    Hepatitis C virus infection    Patient stated she has been treated   Hepatitis C, chronic ([place]) 2004   genotype 1a   Hyperlipidemia    Medical history non-contributory    Thrombocytopenia [facility])     Patient Active Problem List   Diagnosis Date Noted   Osteoarthritis of hands, bilateral 06/09/2021   Rheumatoid factor positive 05/25/2021   Hepatoma ([place]) 04/05/2021   Thrombocytopenia ([place]) 04/05/2021   Other chest pain 04/05/2021   Arthralgia 04/05/2021   History of tobacco use 04/05/2021   Smoker 02/01/2021   Hepatic steatosis 02/01/2021   Age-related osteoporosis without current pathological fracture 10/02/2018   Achilles tendinitis of left lower extremity 02/24/2016   Left hand weakness 05/15/2014   Numbness and tingling in left arm 05/15/2014   Hepatitis C virus infection cured after antiviral drug therapy 05/15/2014    Past Surgical History:  Procedure Laterality Date   ABDOMINAL HYSTERECTOMY     APPENDECTOMY     COLONOSCOPY  2013    IR TRANSCATHETER BX  07/09/2019   IR US GUIDE VASC ACCESS RIGHT  07/09/2019   IR VENOGRAM HEPATIC W HEMODYNAMIC EVALUATION  07/09/2019   PARTIAL HYSTERECTOMY      OB History     Gravida  1   Para  1   Term      Preterm      AB      Living  1      SAB      IAB      Ectopic      Multiple      Live Births               Home Medications    Prior to Admission medications   Medication Sig Start Date End Date Taking? Authorizing Provider  ibuprofen (ADVIL) 800 MG tablet Take 1 tablet (800 mg total) by mouth 3 (three) times daily. 09/13/21   Prosperi, Christian H, PA-C  methocarbamol (ROBAXIN) 500 MG tablet Take 1 tablet (500 mg total) by mouth 2 (two) times daily. 09/13/21   Prosperi, Christian H, PA-C  oseltamivir (TAMIFLU) 75 MG capsule Take 1 capsule (75 mg total) by mouth every 12 (twelve) hours. 09/03/21   Lynden Oxford Scales, PA-C    Family History Family History  Problem Relation Age of Onset   Hypertension Mother    Diabetes Mother    Ovarian cancer Mother    Heart Problems Brother  Throat cancer Brother    Heart disease Maternal Grandmother    Heart disease Maternal Grandfather    Healthy Daughter    Colon cancer Neg Hx    Breast cancer Neg Hx     Social History Social History   Tobacco Use   Smoking status: Former    Packs/day: 0.50    Years: 26.00    Pack years: 13.00    Types: Cigarettes    Start date: 1974    Quit date: 2000    Years since quitting: 23.1   Smokeless tobacco: Never  Vaping Use   Vaping Use: Never used  Substance Use Topics   Alcohol use: Yes    Comment: Occasionally   Drug use: No     Allergies   Hydrocodone, Penicillins, Percocet [oxycodone-acetaminophen], and Prednisone   Review of Systems Review of Systems Per HPI  Physical Exam Triage Vital Signs ED Triage Vitals  Enc Vitals Group     BP 12/15/21 1449 136/67     Pulse Rate 12/15/21 1449 78     Resp 12/15/21 1449 18     Temp 12/15/21 1449  98.6 F (37 C)     Temp Source 12/15/21 1449 Oral     SpO2 12/15/21 1449 98 %     Weight --      Height --      Head Circumference --      Peak Flow --      Pain Score 12/15/21 1448 8     Pain Loc --      Pain Edu? --      Excl. in [place]? --    No data found.  Updated Vital Signs BP 136/67 (BP Location: Left Arm)    Pulse 78    Temp 98.6 F (37 C) (Oral)    Resp 18    SpO2 98%   Visual Acuity Right Eye Distance:   Left Eye Distance:   Bilateral Distance:    Right Eye Near:   Left Eye Near:    Bilateral Near:     Physical Exam Vitals and nursing note reviewed.  Constitutional:      General: She is not in acute distress.    Appearance: Normal appearance. She is not ill-appearing or toxic-appearing.  HENT:     Head: Normocephalic and atraumatic.     Right Ear: External ear normal.     Left Ear: External ear normal.     Nose: Congestion and rhinorrhea present.     Right Sinus: No maxillary sinus tenderness or frontal sinus tenderness.     Left Sinus: No maxillary sinus tenderness or frontal sinus tenderness.     Mouth/Throat:     Mouth: Mucous membranes are moist.     Pharynx: Oropharynx is clear. Posterior oropharyngeal erythema present. No oropharyngeal exudate or uvula swelling.     Tonsils: No tonsillar exudate or tonsillar abscesses. 0 on the right. 0 on the left.  Eyes:     General: No scleral icterus.    Extraocular Movements: Extraocular movements intact.  Cardiovascular:     Rate and Rhythm: Normal rate and regular rhythm.  Pulmonary:     Effort: Pulmonary effort is normal. No respiratory distress.     Breath sounds: Normal breath sounds. No wheezing, rhonchi or rales.  Abdominal:     General: Abdomen is flat. Bowel sounds are normal. There is no distension.     Palpations: Abdomen is soft.     Tenderness: There is no abdominal  tenderness. There is no guarding.  Musculoskeletal:     Cervical back: Normal range of motion and neck supple.  Lymphadenopathy:      Cervical: No cervical adenopathy.  Skin:    General: Skin is warm and dry.     Capillary Refill: Capillary refill takes less than 2 seconds.     Coloration: Skin is not jaundiced or pale.     Findings: No erythema or rash.  Neurological:     Mental Status: She is alert and oriented to person, place, and time.     Motor: No weakness.  Psychiatric:        Mood and Affect: Mood normal.        Behavior: Behavior normal.     UC Treatments / Results  Labs (all labs ordered are listed, but only abnormal results are displayed) Labs Reviewed  SARS CORONAVIRUS 2 (TAT 6-24 HRS)  POC INFLUENZA A AND B ANTIGEN (URGENT CARE ONLY)    EKG   Radiology No results found.  Procedures Procedures (including critical care time)  Medications Ordered in UC Medications - No data to display  Initial Impression / Assessment and Plan / UC Course  I have reviewed the triage vital signs and the nursing notes.  Pertinent labs & imaging results that were available during my care of the patient were reviewed by me and considered in my medical decision making (see chart for details).    Symptoms are viral in nature.  Vital signs today are reassuring.  Obtain influenza and COVID testing.  Will notify with positive results.  Reassured patient that symptoms and exam findings are most consistent with a viral upper respiratory infection and explained lack of efficacy of antibiotics against viruses. Continue supportive care. Increase fluid intake with water or electrolyte solution like pedialyte. Encouraged acetaminophen as needed for fever/pain. Encouraged salt water gargling, chloraseptic spray and throat lozenges. Encouraged OTC guaifenesin. Encouraged saline sinus flushes and/or neti with humidified air.  If symptoms persist without improvement for more than 7-10 days, follow up with primary care provider.  With any sudden onset new chest pain, dizziness, sweating, or shortness of breath, go to ED.  Final  Clinical Impressions(s) / UC Diagnoses   Final diagnoses:  Influenza-like illness  Viral upper respiratory tract infection     Discharge Instructions      We will let you know with any positive results.  If your symptoms last longer than 10 days and/or you start feeling worse with facial pain, high fever, cough, shortness of breath or start feeling significantly worse, please reach out to your primary care provider for further evaluation or seek urgent care. Some things that can make you feel better are: - Increased rest - Increasing fluid with water/sugar free electrolytes - Acetaminophen as needed for fever/pain.  - Salt water gargling, chloraseptic spray and throat lozenges - OTC guaifenesin (Mucinex).  - Saline sinus flushes or a neti pot.  - Humidifying the air.      ED Prescriptions   None    PDMP not reviewed this encounter.   Eulogio Bear, NP 12/15/21 [phone]

## 2021-12-16 LAB — SARS CORONAVIRUS 2 (TAT 6-24 HRS): SARS Coronavirus 2: POSITIVE — AB

## 2021-12-23 ENCOUNTER — Ambulatory Visit (INDEPENDENT_AMBULATORY_CARE_PROVIDER_SITE_OTHER): Payer: Medicare Other

## 2021-12-23 VITALS — Ht 64.0 in | Wt 137.0 lb

## 2021-12-23 DIAGNOSIS — Z0000 Encounter for general adult medical examination without abnormal findings: Secondary | ICD-10-CM | POA: Diagnosis not present

## 2021-12-23 NOTE — Progress Notes (Signed)
?I connected with Taylor Santiago today by telephone and verified that I am speaking with the correct person using two identifiers. ?Location patient: home ?Location provider: work ?Persons participating in the virtual visit: Juley, Giovanetti LPN. ?  ?I discussed the limitations, risks, security and privacy concerns of performing an evaluation and management service by telephone and the availability of in person appointments. I also discussed with the patient that there may be a patient responsible charge related to this service. The patient expressed understanding and verbally consented to this telephonic visit.  ?  ?Interactive audio and video telecommunications were attempted between this provider and patient, however failed, due to patient having technical difficulties OR patient did not have access to video capability.  We continued and completed visit with audio only. ? ?  ? ?Vital signs may be patient reported or missing. ? ?Subjective:  ? Taylor Santiago is a 66 y.o. female who presents for an Initial Medicare Annual Wellness Visit. ? ?Review of Systems    ? ?Cardiac Risk Factors include: advanced age (>23mn, >>29women) ? ?   ?Objective:  ?  ?Today's Vitals  ? 12/23/21 1556 12/23/21 1557  ?Weight: 137 lb (62.1 kg)   ?Height: '5\' 4"'$  (1.626 m)   ?PainSc:  7   ? ?Body mass index is 23.52 kg/m?. ? ?Advanced Directives 12/23/2021 09/13/2021 11/03/2016 09/07/2016 07/07/2016 02/15/2016 12/31/2015  ?Does Patient Have a Medical Advance Directive? No No Yes No No No No  ?Type of Advance Directive - - Living will - - - -  ?Does patient want to make changes to medical advance directive? - - - - - - -  ?Copy of HCass[place]ain Chart? - - - - - - -  ?Would patient like information on creating a medical advance directive? - - No - Patient declined No - Patient declined - - -  ? ? ?Current Medications (verified) ?Outpatient Encounter Medications as of 12/23/2021  ?Medication Sig  ? ibuprofen (ADVIL)  800 MG tablet Take 1 tablet (800 mg total) by mouth 3 (three) times daily. (Patient taking differently: Take 800 mg by mouth every 8 (eight) hours as needed.)  ? methocarbamol (ROBAXIN) 500 MG tablet Take 1 tablet (500 mg total) by mouth 2 (two) times daily. (Patient taking differently: Take 500 mg by mouth daily as needed.)  ? ?No facility-administered encounter medications on file as of 12/23/2021.  ? ? ?Allergies (verified) ?Hydrocodone, Penicillins, Percocet [oxycodone-acetaminophen], and Prednisone  ? ?History: ?Past Medical History:  ?Diagnosis Date  ? Abnormal LFTs (liver function tests)   ? [place]   ? Hepatitis C virus infection   ? Patient stated she has been treated  ? [place]  ? genotype 1a  ? Hyperlipidemia   ? Medical history non-contributory   ? [place]   ? ?Past Surgical History:  ?Procedure Laterality Date  ? ABDOMINAL HYSTERECTOMY    ? APPENDECTOMY    ? COLONOSCOPY  2013  ? IR TRANSCATHETER BX  07/09/2019  ? IR [place] V[place] ACCESS RIGHT  07/09/2019  ? IR VENOGRAM HEPATIC W HEMODYNAMIC EVALUATION  07/09/2019  ? PARTIAL HYSTERECTOMY    ? ?Family History  ?Problem Relation Age of Onset  ? Hypertension Mother   ? Diabetes Mother   ? Ovarian cancer Mother   ? Heart Problems Brother   ? Throat cancer Brother   ? Heart disease Maternal Grandmother   ? Heart disease Maternal Grandfather   ?  Healthy Daughter   ? Colon cancer Neg Hx   ? Breast cancer Neg Hx   ? ?Social History  ? ?Socioeconomic History  ? Marital status: Single  ?  Spouse name: Not on file  ? Number of children: 1  ? Years of education: 12th  ? Highest education level: Not on file  ?Occupational History  ? Occupation: Writer  ?  Employer: [employer]  ?Tobacco Use  ? Smoking status: Former  ?  Packs/day: 0.50  ?  Years: 26.00  ?  Pack years: 13.00  ?  Types: Cigarettes  ?  Start date: 11  ?  Quit date: 2000  ?  Years since quitting: 23.2  ? Smokeless tobacco: Never  ?Vaping  Use  ? Vaping Use: Never used  ?Substance and Sexual Activity  ? Alcohol use: Not Currently  ?  Comment: Occasionally  ? Drug use: No  ? Sexual activity: Not Currently  ?Other Topics Concern  ? Not on file  ?Social History Narrative  ? Not on file  ? ?Social Determinants of Health  ? ?Financial Resource Strain: Low Risk   ? Difficulty of Paying Living Expenses: Not hard at all  ?Food Insecurity: No Food Insecurity  ? Worried About Charity fundraiser in the Last Year: Never true  ? Ran Out of Food in the Last Year: Never true  ?Transportation Needs: No Transportation Needs  ? Lack of Transportation (Medical): No  ? Lack of Transportation (Non-Medical): No  ?Physical Activity: Inactive  ? Days of Exercise per Week: 0 days  ? Minutes of Exercise per Session: 0 min  ?Stress: No Stress Concern Present  ? Feeling of Stress : Not at all  ?Social Connections: Not on file  ? ? ?Tobacco Counseling ?Counseling given: Not Answered ? ? ?Clinical Intake: ? ?Pre-visit preparation completed: Yes ? ?Pain : 0-10 ?Pain Score: 7  ?Pain Type: Acute pain ?Pain Location: Head ?Pain Descriptors / Indicators: Aching ?Pain Onset: In the past 7 days ?Pain Frequency: Intermittent ? ?  ? ?Nutritional Status: BMI of 19-24  Normal ?Nutritional Risks: None ?Diabetes: No ? ?How often do you need to have someone help you when you read instructions, pamphlets, or other written materials from your doctor or pharmacy?: 1 - Never ?What is the last grade level you completed in school?: 12th grade ? ?Diabetic?no ? ?Interpreter Needed?: No ? ?Information entered by :: NAllen LPN ? ? ?Activities of Daily Living ?In your present state of health, do you have any difficulty performing the following activities: 12/23/2021 04/05/2021  ?Hearing? N N  ?Vision? Y Y  ?Comment has to strain some times -  ?Difficulty concentrating or making decisions? N N  ?Walking or climbing stairs? Y Y  ?Dressing or bathing? N N  ?Doing errands, shopping? N N  ?Preparing Food and  eating ? N -  ?Using the Toilet? N -  ?In the past six months, have you accidently leaked urine? N -  ?Do you have problems with loss of bowel control? N -  ?Managing your Medications? N -  ?Managing your Finances? N -  ?Housekeeping or managing your Housekeeping? N -  ?Some recent data might be hidden  ? ? ?Patient Care Team: ?Glendale Chard, MD as PCP - General (Internal Medicine) ? ?Indicate any recent Medical Services you may have received from other than [facility] providers in the past year (date may be approximate). ? ?   ?Assessment:  ? This is a  routine wellness examination for Taylor Santiago. ? ?Hearing/Vision screen ?Vision Screening - Comments:: Regular eye exams, Dr. Gershon Crane ? ?Dietary issues and exercise activities discussed: ?Current Exercise Habits: The patient does not participate in regular exercise at present ? ? Goals Addressed   ? ?  ?  ?  ?  ? This Visit's Progress  ?  Patient Stated     ?  12/23/2021 wants to start exercising and eat healthier ?  ? ?  ? ?Depression Screen ?PHQ 2/9 Scores 12/23/2021 04/05/2021 04/02/2019 10/18/2018 09/27/2018  ?PHQ - 2 Score 0 0 0 0 0  ?  ?Fall Risk ?Fall Risk  12/23/2021 04/05/2021 04/02/2019 09/27/2018  ?Falls in the past year? 0 0 0 0  ?Number falls in past yr: - 0 - -  ?Injury with Fall? - 0 - -  ?Risk for fall due to : No Fall Risks - - -  ?Follow up Falls evaluation completed;Education provided;Falls prevention discussed - - -  ? ? ?FALL RISK PREVENTION PERTAINING TO THE HOME: ? ?Any stairs in or around the home? No  ?If so, are there any without handrails?  N/a ?Home free of loose throw rugs in walkways, pet beds, electrical cords, etc? Yes  ?Adequate lighting in your home to reduce risk of falls? Yes  ? ?ASSISTIVE DEVICES UTILIZED TO PREVENT FALLS: ? ?Life alert? No  ?Use of a cane, walker or w/c? No  ?Grab bars in the bathroom? No  ?Shower chair or bench in shower? No  ?Elevated toilet seat or a handicapped toilet? No  ? ?TIMED UP AND GO: ? ?Was the test performed? No .   ? ? ? ? ?Cognitive Function: ?  ?  ?6CIT Screen 12/23/2021  ?What Year? 0 points  ?What month? 0 points  ?What time? 0 points  ?Count back from 20 0 points  ?Months in reverse 0 points  ?Repeat phrase 2 poin

## 2021-12-23 NOTE — Patient Instructions (Signed)
Taylor Santiago , ?Thank you for taking time to come for your Medicare Wellness Visit. I appreciate your ongoing commitment to your health goals. Please review the following plan we discussed and let me know if I can assist you in the future.  ? ?Screening recommendations/referrals: ?Colonoscopy: completed 08/23/2021, due 08/23/2028 ?Mammogram: completed 07/02/2021, due 07/03/2022 ?Bone Density: completed 11/04/2021 ?Recommended yearly ophthalmology/optometry visit for glaucoma screening and checkup ?Recommended yearly dental visit for hygiene and checkup ? ?Vaccinations: ?Influenza vaccine: completed 07/21/2021, due next flu season ?Pneumococcal vaccine: due for Prevnar 20 ?Tdap vaccine: completed 07/13/2020, due 07/13/2030 ?Shingles vaccine:  due   ?Covid-19: 11/03/2020, 01/11/2020, 12/21/2019 ? ?Advanced directives: Advance directive discussed with you today.  ? ? ?Conditions/risks identified: none ? ?Next appointment: Follow up in one year for your annual wellness visit  ? ? ?Preventive Care 31 Years and Older, Female ?Preventive care refers to lifestyle choices and visits with your health care provider that can promote health and wellness. ?What does preventive care include? ?A yearly physical exam. This is also called an annual well check. ?Dental exams once or twice a year. ?Routine eye exams. Ask your health care provider how often you should have your eyes checked. ?Personal lifestyle choices, including: ?Daily care of your teeth and gums. ?Regular physical activity. ?Eating a healthy diet. ?Avoiding tobacco and drug use. ?Limiting alcohol use. ?Practicing safe sex. ?Taking low-dose aspirin every day. ?Taking vitamin and mineral supplements as recommended by your health care provider. ?What happens during an annual well check? ?The services and screenings done by your health care provider during your annual well check will depend on your age, overall health, lifestyle risk factors, and family history of  disease. ?Counseling  ?Your health care provider may ask you questions about your: ?Alcohol use. ?Tobacco use. ?Drug use. ?Emotional well-being. ?Home and relationship well-being. ?Sexual activity. ?Eating habits. ?History of falls. ?Memory and ability to understand (cognition). ?Work and work Statistician. ?Reproductive health. ?Screening  ?You may have the following tests or measurements: ?Height, weight, and BMI. ?Blood pressure. ?Lipid and cholesterol levels. These may be checked every 5 years, or more frequently if you are over 20 years old. ?Skin check. ?Lung cancer screening. You may have this screening every year starting at age 71 if you have a 30-pack-year history of smoking and currently smoke or have quit within the past 15 years. ?Fecal occult blood test (FOBT) of the stool. You may have this test every year starting at age 43. ?Flexible sigmoidoscopy or colonoscopy. You may have a sigmoidoscopy every 5 years or a colonoscopy every 10 years starting at age 21. ?Hepatitis C blood test. ?Hepatitis B blood test. ?Sexually transmitted disease (STD) testing. ?Diabetes screening. This is done by checking your blood sugar (glucose) after you have not eaten for a while (fasting). You may have this done every 1-3 years. ?Bone density scan. This is done to screen for osteoporosis. You may have this done starting at age 28. ?Mammogram. This may be done every 1-2 years. Talk to your health care provider about how often you should have regular mammograms. ?Talk with your health care provider about your test results, treatment options, and if necessary, the need for more tests. ?Vaccines  ?Your health care provider may recommend certain vaccines, such as: ?Influenza vaccine. This is recommended every year. ?Tetanus, diphtheria, and acellular pertussis (Tdap, Td) vaccine. You may need a Td booster every 10 years. ?Zoster vaccine. You may need this after age 52. ?Pneumococcal 13-valent conjugate (PCV13) vaccine. One  dose is recommended after age 94. ?Pneumococcal polysaccharide (PPSV23) vaccine. One dose is recommended after age 82. ?Talk to your health care provider about which screenings and vaccines you need and how often you need them. ?This information is not intended to replace advice given to you by your health care provider. Make sure you discuss any questions you have with your health care provider. ?Document Released: 10/23/2015 Document Revised: 06/15/2016 Document Reviewed: 07/28/2015 ?Elsevier Interactive Patient Education ? 2017 [place]. ? ?Fall Prevention in the Home ?Falls can cause injuries. They can happen to people of all ages. There are many things you can do to make your home safe and to help prevent falls. ?What can I do on the outside of my home? ?Regularly fix the edges of walkways and driveways and fix any cracks. ?Remove anything that might make you trip as you walk through a door, such as a raised step or threshold. ?Trim any bushes or trees on the path to your home. ?Use bright outdoor lighting. ?Clear any walking paths of anything that might make someone trip, such as rocks or tools. ?Regularly check to see if handrails are loose or broken. Make sure that both sides of any steps have handrails. ?Any raised decks and porches should have guardrails on the edges. ?Have any leaves, snow, or ice cleared regularly. ?Use sand or salt on walking paths during winter. ?Clean up any spills in your garage right away. This includes oil or grease spills. ?What can I do in the bathroom? ?Use night lights. ?Install grab bars by the toilet and in the tub and shower. Do not use towel bars as grab bars. ?Use non-skid mats or decals in the tub or shower. ?If you need to sit down in the shower, use a plastic, non-slip stool. ?Keep the floor dry. Clean up any water that spills on the floor as soon as it happens. ?Remove soap buildup in the tub or shower regularly. ?Attach bath mats securely with double-sided  non-slip rug tape. ?Do not have throw rugs and other things on the floor that can make you trip. ?What can I do in the bedroom? ?Use night lights. ?Make sure that you have a light by your bed that is easy to reach. ?Do not use any sheets or blankets that are too big for your bed. They should not hang down onto the floor. ?Have a firm chair that has side arms. You can use this for support while you get dressed. ?Do not have throw rugs and other things on the floor that can make you trip. ?What can I do in the kitchen? ?Clean up any spills right away. ?Avoid walking on wet floors. ?Keep items that you use a lot in easy-to-reach places. ?If you need to reach something above you, use a strong step stool that has a grab bar. ?Keep electrical cords out of the way. ?Do not use floor polish or wax that makes floors slippery. If you must use wax, use non-skid floor wax. ?Do not have throw rugs and other things on the floor that can make you trip. ?What can I do with my stairs? ?Do not leave any items on the stairs. ?Make sure that there are handrails on both sides of the stairs and use them. Fix handrails that are broken or loose. Make sure that handrails are as long as the stairways. ?Check any carpeting to make sure that it is firmly attached to the stairs. Fix any carpet that is loose or worn. ?Avoid  having throw rugs at the top or bottom of the stairs. If you do have throw rugs, attach them to the floor with carpet tape. ?Make sure that you have a light switch at the top of the stairs and the bottom of the stairs. If you do not have them, ask someone to add them for you. ?What else can I do to help prevent falls? ?Wear shoes that: ?Do not have high heels. ?Have rubber bottoms. ?Are comfortable and fit you well. ?Are closed at the toe. Do not wear sandals. ?If you use a stepladder: ?Make sure that it is fully opened. Do not climb a closed stepladder. ?Make sure that both sides of the stepladder are locked into place. ?Ask  someone to hold it for you, if possible. ?Clearly mark and make sure that you can see: ?Any grab bars or handrails. ?First and last steps. ?Where the edge of each step is. ?Use tools that help you move around (mobility aids) if

## 2021-12-28 ENCOUNTER — Ambulatory Visit (HOSPITAL_COMMUNITY)
Admission: EM | Admit: 2021-12-28 | Discharge: 2021-12-28 | Disposition: A | Discharge status: Home / Self Care | Payer: Medicare Other | Attending: Student | Admitting: Student

## 2021-12-28 ENCOUNTER — Encounter (HOSPITAL_COMMUNITY): Payer: Self-pay

## 2021-12-28 DIAGNOSIS — Z20822 Contact with and (suspected) exposure to covid-19: Secondary | ICD-10-CM | POA: Diagnosis not present

## 2021-12-28 NOTE — ED Triage Notes (Signed)
Pt presents with requests for COVID re-testing. States she needs to a negative test to go back to church. ?

## 2021-12-28 NOTE — ED Provider Notes (Signed)
?[facility] ? ? ? ?CSN: [id number] ?Arrival date & time: 12/28/21  1416 ? ? ?  ? ?History   ?Chief Complaint ?Chief Complaint  ?Patient presents with  ? Covid Re-testing  ? ? ?HPI ?Taylor Santiago is a 66 y.o. female presenting with the request of repeat COVID test.  She was COVID-positive on 3/8.  History former smoker.  States she is feeling a lot better today, her cough is largely resolved and she has not having any shortness of breath or chest pain. ? ?HPI ? ?Past Medical History:  ?Diagnosis Date  ? Abnormal LFTs (liver function tests)   ? Cirrhosis ([place])   ? Hepatitis C virus infection   ? Patient stated she has been treated  ? Hepatitis C, chronic ([place]) 2004  ? genotype 1a  ? Hyperlipidemia   ? Medical history non-contributory   ? Thrombocytopenia ([place])   ? ? ?Patient Active Problem List  ? Diagnosis Date Noted  ? Osteoarthritis of hands, bilateral 06/09/2021  ? Rheumatoid factor positive 05/25/2021  ? Hepatoma ([place]) 04/05/2021  ? Thrombocytopenia ([place]) 04/05/2021  ? Other chest pain 04/05/2021  ? Arthralgia 04/05/2021  ? History of tobacco use 04/05/2021  ? Smoker 02/01/2021  ? Hepatic steatosis 02/01/2021  ? Age-related osteoporosis without current pathological fracture 10/02/2018  ? Achilles tendinitis of left lower extremity 02/24/2016  ? Left hand weakness 05/15/2014  ? Numbness and tingling in left arm 05/15/2014  ? Hepatitis C virus infection cured after antiviral drug therapy 05/15/2014  ? ? ?Past Surgical History:  ?Procedure Laterality Date  ? ABDOMINAL HYSTERECTOMY    ? APPENDECTOMY    ? COLONOSCOPY  2013  ? IR TRANSCATHETER BX  07/09/2019  ? IR US GUIDE VASC ACCESS RIGHT  07/09/2019  ? IR VENOGRAM HEPATIC W HEMODYNAMIC EVALUATION  07/09/2019  ? PARTIAL HYSTERECTOMY    ? ? ?OB History   ? ? Gravida  ?1  ? Para  ?1  ? Term  ?   ? Preterm  ?   ? AB  ?   ? Living  ?1  ?  ? ? SAB  ?   ? IAB  ?   ? Ectopic  ?   ? Multiple  ?   ? Live Births  ?   ?   ?  ?  ? ? ? ?Home Medications   ? ?Prior to  Admission medications   ?Not on File  ? ? ?Family History ?Family History  ?Problem Relation Age of Onset  ? Hypertension Mother   ? Diabetes Mother   ? Ovarian cancer Mother   ? Heart Problems Brother   ? Throat cancer Brother   ? Heart disease Maternal Grandmother   ? Heart disease Maternal Grandfather   ? Healthy Daughter   ? Colon cancer Neg Hx   ? Breast cancer Neg Hx   ? ? ?Social History ?Social History  ? ?Tobacco Use  ? Smoking status: Former  ?  Packs/day: 0.50  ?  Years: 26.00  ?  Pack years: 13.00  ?  Types: Cigarettes  ?  Start date: 41  ?  Quit date: 2000  ?  Years since quitting: 23.2  ? Smokeless tobacco: Never  ?Vaping Use  ? Vaping Use: Never used  ?Substance Use Topics  ? Alcohol use: Not Currently  ?  Comment: Occasionally  ? Drug use: No  ? ? ? ?Allergies   ?Hydrocodone, Penicillins, Percocet [oxycodone-acetaminophen], and Prednisone ? ? ?Review of  Systems ?Review of Systems  ?Constitutional:  Negative for appetite change, chills and fever.  ?HENT:  Positive for congestion. Negative for ear pain, rhinorrhea, sinus pressure, sinus pain and sore throat.   ?Eyes:  Negative for redness and visual disturbance.  ?Respiratory:  Positive for cough. Negative for chest tightness, shortness of breath and wheezing.   ?Cardiovascular:  Negative for chest pain and palpitations.  ?Gastrointestinal:  Negative for abdominal pain, constipation, diarrhea, nausea and vomiting.  ?Genitourinary:  Negative for dysuria, frequency and urgency.  ?Musculoskeletal:  Negative for myalgias.  ?Neurological:  Negative for dizziness, weakness and headaches.  ?Psychiatric/Behavioral:  Negative for confusion.   ?All other systems reviewed and are negative. ? ? ?Physical Exam ?Triage Vital Signs ?ED Triage Vitals  ?Enc Vitals Group  ?   BP   ?   Pulse   ?   Resp   ?   Temp   ?   Temp src   ?   SpO2   ?   Weight   ?   Height   ?   Head Circumference   ?   Peak Flow   ?   Pain Score   ?   Pain Loc   ?   Pain Edu?   ?   Excl. in  [place]?   ? ?No data found. ? ?Updated Vital Signs ?BP 137/84 (BP Location: Left Arm)   Pulse 66   Resp 17   SpO2 98%  ? ?Visual Acuity ?Right Eye Distance:   ?Left Eye Distance:   ?Bilateral Distance:   ? ?Right Eye Near:   ?Left Eye Near:    ?Bilateral Near:    ? ?Physical Exam ?Vitals reviewed.  ?Constitutional:   ?   General: She is not in acute distress. ?   Appearance: Normal appearance. She is not ill-appearing or diaphoretic.  ?HENT:  ?   Head: Normocephalic and atraumatic.  ?Cardiovascular:  ?   Rate and Rhythm: Normal rate and regular rhythm.  ?   Heart sounds: Normal heart sounds.  ?Pulmonary:  ?   Effort: Pulmonary effort is normal.  ?   Breath sounds: Normal breath sounds.  ?Skin: ?   General: Skin is warm.  ?Neurological:  ?   General: No focal deficit present.  ?   Mental Status: She is alert and oriented to person, place, and time.  ?Psychiatric:     ?   Mood and Affect: Mood normal.     ?   Behavior: Behavior normal.     ?   Thought Content: Thought content normal.     ?   Judgment: Judgment normal.  ? ? ? ?UC Treatments / Results  ?Labs ?(all labs ordered are listed, but only abnormal results are displayed) ?Labs Reviewed - No data to display ? ?EKG ? ? ?Radiology ?No results found. ? ?Procedures ?Procedures (including critical care time) ? ?Medications Ordered in UC ?Medications - No data to display ? ?Initial Impression / Assessment and Plan / UC Course  ?I have reviewed the triage vital signs and the nursing notes. ? ?Pertinent labs & imaging results that were available during my care of the patient were reviewed by me and considered in my medical decision making (see chart for details). ? ?  ? ?This patient is a very pleasant 66 y.o. year old female presenting for repeat covid testing. Afebrile, nontachy, no adventitious breath sounds. She was covid positive 3/8 and understands today's test will mostly likely be positive as  well. Covid PCR sent. ED return precautions discussed. Patient  verbalizes understanding and agreement.  ?.  ? ?Final Clinical Impressions(s) / UC Diagnoses  ? ?Final diagnoses:  ?Encounter for laboratory testing for COVID-19 virus  ? ?Discharge Instructions   ?None ?  ? ?ED Prescriptions   ?None ?  ? ?PDMP not reviewed this encounter. ?  ?Hazel Sams, PA-C ?12/28/21 1608 ? ?

## 2021-12-29 LAB — SARS CORONAVIRUS 2 (TAT 6-24 HRS): SARS Coronavirus 2: NEGATIVE

## 2022-02-03 DIAGNOSIS — H2513 Age-related nuclear cataract, bilateral: Secondary | ICD-10-CM | POA: Diagnosis not present

## 2022-03-02 ENCOUNTER — Other Ambulatory Visit: Payer: Self-pay | Admitting: Nurse Practitioner

## 2022-03-02 DIAGNOSIS — K7400 Hepatic fibrosis, unspecified: Secondary | ICD-10-CM

## 2022-03-02 DIAGNOSIS — Z8619 Personal history of other infectious and parasitic diseases: Secondary | ICD-10-CM | POA: Diagnosis not present

## 2022-03-09 ENCOUNTER — Ambulatory Visit
Admission: RE | Admit: 2022-03-09 | Discharge: 2022-03-09 | Disposition: A | Discharge status: Home / Self Care | Payer: Medicare Other | Source: Ambulatory Visit | Attending: Nurse Practitioner | Admitting: Nurse Practitioner

## 2022-03-09 DIAGNOSIS — K7460 Unspecified cirrhosis of liver: Secondary | ICD-10-CM | POA: Diagnosis not present

## 2022-03-09 DIAGNOSIS — K7400 Hepatic fibrosis, unspecified: Secondary | ICD-10-CM

## 2022-04-13 ENCOUNTER — Encounter: Payer: Medicare Other | Admitting: Internal Medicine

## 2022-04-18 ENCOUNTER — Encounter: Payer: Self-pay | Admitting: Nurse Practitioner

## 2022-04-18 ENCOUNTER — Ambulatory Visit (INDEPENDENT_AMBULATORY_CARE_PROVIDER_SITE_OTHER): Payer: Medicare Other | Admitting: Nurse Practitioner

## 2022-04-18 VITALS — BP 120/64 | HR 70 | Temp 98.0°F | Ht 64.0 in | Wt 135.4 lb

## 2022-04-18 DIAGNOSIS — Z0000 Encounter for general adult medical examination without abnormal findings: Secondary | ICD-10-CM

## 2022-04-18 DIAGNOSIS — D696 Thrombocytopenia, unspecified: Secondary | ICD-10-CM | POA: Diagnosis not present

## 2022-04-18 DIAGNOSIS — K7400 Hepatic fibrosis, unspecified: Secondary | ICD-10-CM

## 2022-04-18 DIAGNOSIS — R945 Abnormal results of liver function studies: Secondary | ICD-10-CM | POA: Diagnosis not present

## 2022-04-18 DIAGNOSIS — B182 Chronic viral hepatitis C: Secondary | ICD-10-CM

## 2022-04-18 DIAGNOSIS — Z23 Encounter for immunization: Secondary | ICD-10-CM | POA: Diagnosis not present

## 2022-04-18 DIAGNOSIS — E785 Hyperlipidemia, unspecified: Secondary | ICD-10-CM | POA: Diagnosis not present

## 2022-04-18 DIAGNOSIS — M8580 Other specified disorders of bone density and structure, unspecified site: Secondary | ICD-10-CM | POA: Diagnosis not present

## 2022-04-18 DIAGNOSIS — R42 Dizziness and giddiness: Secondary | ICD-10-CM | POA: Diagnosis not present

## 2022-04-18 NOTE — Patient Instructions (Signed)
Health Maintenance After Age 65 After age 65, you are at a higher risk for certain long-term diseases and infections as well as injuries from falls. Falls are a major cause of broken bones and head injuries in people who are older than age 65. Getting regular preventive care can help to keep you healthy and well. Preventive care includes getting regular testing and making lifestyle changes as recommended by your health care provider. Talk with your health care provider about: Which screenings and tests you should have. A screening is a test that checks for a disease when you have no symptoms. A diet and exercise plan that is right for you. What should I know about screenings and tests to prevent falls? Screening and testing are the best ways to find a health problem early. Early diagnosis and treatment give you the best chance of managing medical conditions that are common after age 65. Certain conditions and lifestyle choices may make you more likely to have a fall. Your health care provider may recommend: Regular vision checks. Poor vision and conditions such as cataracts can make you more likely to have a fall. If you wear glasses, make sure to get your prescription updated if your vision changes. Medicine review. Work with your health care provider to regularly review all of the medicines you are taking, including over-the-counter medicines. Ask your health care provider about any side effects that may make you more likely to have a fall. Tell your health care provider if any medicines that you take make you feel dizzy or sleepy. Strength and balance checks. Your health care provider may recommend certain tests to check your strength and balance while standing, walking, or changing positions. Foot health exam. Foot pain and numbness, as well as not wearing proper footwear, can make you more likely to have a fall. Screenings, including: Osteoporosis screening. Osteoporosis is a condition that causes  the bones to get weaker and break more easily. Blood pressure screening. Blood pressure changes and medicines to control blood pressure can make you feel dizzy. Depression screening. You may be more likely to have a fall if you have a fear of falling, feel depressed, or feel unable to do activities that you used to do. Alcohol use screening. Using too much alcohol can affect your balance and may make you more likely to have a fall. Follow these instructions at home: Lifestyle Do not drink alcohol if: Your health care provider tells you not to drink. If you drink alcohol: Limit how much you have to: 0-1 drink a day for women. 0-2 drinks a day for men. Know how much alcohol is in your drink. In the [place] one drink equals one 12 oz bottle of beer (355 mL), one 5 oz glass of wine (148 mL), or one 1 oz glass of hard liquor (44 mL). Do not use any products that contain nicotine or tobacco. These products include cigarettes, chewing tobacco, and vaping devices, such as e-cigarettes. If you need help quitting, ask your health care provider. Activity  Follow a regular exercise program to stay fit. This will help you maintain your balance. Ask your health care provider what types of exercise are appropriate for you. If you need a cane or walker, use it as recommended by your health care provider. Wear supportive shoes that have nonskid soles. Safety  Remove any tripping hazards, such as rugs, cords, and clutter. Install safety equipment such as grab bars in bathrooms and safety rails on stairs. Keep rooms and walkways   well-lit. General instructions Talk with your health care provider about your risks for falling. Tell your health care provider if: You fall. Be sure to tell your health care provider about all falls, even ones that seem minor. You feel dizzy, tiredness (fatigue), or off-balance. Take over-the-counter and prescription medicines only as told by your health care provider. These include  supplements. Eat a healthy diet and maintain a healthy weight. A healthy diet includes low-fat dairy products, low-fat (lean) meats, and fiber from whole grains, beans, and lots of fruits and vegetables. Stay current with your vaccines. Schedule regular health, dental, and eye exams. Summary Having a healthy lifestyle and getting preventive care can help to protect your health and wellness after age 65. Screening and testing are the best way to find a health problem early and help you avoid having a fall. Early diagnosis and treatment give you the best chance for managing medical conditions that are more common for people who are older than age 65. Falls are a major cause of broken bones and head injuries in people who are older than age 65. Take precautions to prevent a fall at home. Work with your health care provider to learn what changes you can make to improve your health and wellness and to prevent falls. This information is not intended to replace advice given to you by your health care provider. Make sure you discuss any questions you have with your health care provider. Document Revised: 02/15/2021 Document Reviewed: 02/15/2021 Elsevier Patient Education  2023 [employer]  

## 2022-04-18 NOTE — Progress Notes (Signed)
Taylor Santiago,acting as a Education administrator for Taylor Brine, FNP.,have documented all relevant documentation on the behalf of Taylor Brine, FNP,as directed by  Taylor Brine, FNP while in the presence of Taylor Santiago, [place].   Subjective:     Patient ID: Taylor Santiago , female    DOB: [birth year] , 66 y.o.   MRN: [id number]   Chief Complaint  Patient presents with   Annual Exam    HPI  Patient presents to for HM.   Patient states she has been having some dizziness going on for a while on and off when she wakes up and lays down.   Dizziness This is a chronic problem. The current episode started 1 to 4 weeks ago. The problem occurs intermittently. The problem has been unchanged. Pertinent negatives include no anorexia, chills, fatigue, visual change or vomiting. Associated symptoms comments: Feels like a film comes over her eye. She has tried nothing for the symptoms.     Past Medical History:  Diagnosis Date   Abnormal LFTs (liver function tests)    Cirrhosis ([place])    Hepatitis C virus infection    Patient stated she has been treated   Hepatitis C, chronic ([place]) 2004   genotype 1a   Hyperlipidemia    Medical history non-contributory    Thrombocytopenia ([place])      Family History  Problem Relation Age of Onset   Hypertension Mother    Diabetes Mother    Ovarian cancer Mother    Heart Problems Brother    Throat cancer Brother    Heart disease Maternal Grandmother    Heart disease Maternal Grandfather    Healthy Daughter    Colon cancer Neg Hx    Breast cancer Neg Hx     No current outpatient medications on file.   Allergies  Allergen Reactions   Hydrocodone Itching   Penicillins Hives    Sweats Has patient had a PCN reaction causing immediate rash, facial/tongue/throat swelling, SOB or lightheadedness with hypotension: No Has patient had a PCN reaction causing severe rash involving mucus membranes or skin necrosis:No Has patient had a PCN reaction that required  hospitalization No Has patient had a PCN reaction occurring within the last 10 years: no   If all of the above answers are "NO", then may proceed with Cephalosporin use.    Percocet [Oxycodone-Acetaminophen]    Prednisone Rash      The patient states she uses status post hysterectomy. No LMP recorded. Patient has had a hysterectomy.. Negative for Dysmenorrhea and Negative for Menorrhagia. Negative for: breast discharge, breast lump(s), breast pain and breast self exam. Associated symptoms include abnormal vaginal bleeding. Pertinent negatives include abnormal bleeding (hematology), anxiety, decreased libido, depression, difficulty falling sleep, dyspareunia, history of infertility, nocturia, sexual dysfunction, sleep disturbances, urinary incontinence, urinary urgency, vaginal discharge and vaginal itching. Diet regular; she tries to avoid fried foods. The patient states her exercise level is minimal with walking.   The patient's tobacco use is:  Social History   Tobacco Use  Smoking Status Former   Packs/day: 0.50   Years: 26.00   Total pack years: 13.00   Types: Cigarettes   Start date: 33   Quit date: 2000   Years since quitting: 23.6  Smokeless Tobacco Never   She has been exposed to passive smoke. The patient's alcohol use is:  Social History   Substance and Sexual Activity  Alcohol Use Not Currently   Comment: Occasionally    Review of Systems  Constitutional:  Negative for chills and fatigue.  HENT: Negative.    Eyes: Negative.   Respiratory: Negative.    Cardiovascular: Negative.   Gastrointestinal: Negative.  Negative for anorexia and vomiting.  Endocrine: Negative.   Genitourinary: Negative.   Musculoskeletal: Negative.   Allergic/Immunologic: Negative.   Neurological:  Positive for dizziness.  Hematological: Negative.   Psychiatric/Behavioral: Negative.       Today's Vitals   04/18/22 0855  BP: 120/64  Pulse: 70  Temp: 98 F (36.7 C)  TempSrc: Oral   Weight: 135 lb 6.4 oz (61.4 kg)  Height: 5' 4"  (1.626 m)  PainSc: 0-No pain   Body mass index is 23.24 kg/m.  Wt Readings from Last 3 Encounters:  04/18/22 135 lb 6.4 oz (61.4 kg)  12/23/21 137 lb (62.1 kg)  06/09/21 140 lb 6.4 oz (63.7 kg)    Objective:  Physical Exam Vitals reviewed.  Constitutional:      General: She is not in acute distress.    Appearance: Normal appearance. She is well-developed.  HENT:     Head: Normocephalic and atraumatic.     Right Ear: Hearing, tympanic membrane, ear canal and external ear normal. There is no impacted cerumen.     Left Ear: Hearing, tympanic membrane, ear canal and external ear normal. There is no impacted cerumen.     Nose: Nose normal.     Mouth/Throat:     Mouth: Mucous membranes are moist.  Eyes:     General: Lids are normal.     Extraocular Movements: Extraocular movements intact.     Conjunctiva/sclera: Conjunctivae normal.     Pupils: Pupils are equal, round, and reactive to light.     Funduscopic exam:    Right eye: No papilledema.        Left eye: No papilledema.  Neck:     Thyroid: No thyroid mass.     Vascular: No carotid bruit.  Cardiovascular:     Rate and Rhythm: Normal rate and regular rhythm.     Pulses: Normal pulses.     Heart sounds: Normal heart sounds. No murmur heard. Pulmonary:     Effort: Pulmonary effort is normal. No respiratory distress.     Breath sounds: Normal breath sounds. No wheezing.  Chest:     Chest wall: No mass.  Breasts:    Tanner Score is 5.     Right: Normal. No mass or tenderness.     Left: Normal. No mass or tenderness.  Abdominal:     General: Abdomen is flat. Bowel sounds are normal. There is no distension.     Palpations: Abdomen is soft.     Tenderness: There is no abdominal tenderness.  Musculoskeletal:        General: No swelling. Normal range of motion.     Cervical back: Full passive range of motion without pain, normal range of motion and neck supple.     Right  lower leg: No edema.     Left lower leg: No edema.  Lymphadenopathy:     Upper Body:     Right upper body: No supraclavicular, axillary or pectoral adenopathy.     Left upper body: No supraclavicular, axillary or pectoral adenopathy.  Skin:    General: Skin is warm and dry.     Capillary Refill: Capillary refill takes less than 2 seconds.  Neurological:     General: No focal deficit present.     Mental Status: She is alert and oriented to person, place, and time.  Cranial Nerves: No cranial nerve deficit.     Sensory: No sensory deficit.  Psychiatric:        Mood and Affect: Mood normal.        Behavior: Behavior normal.        Thought Content: Thought content normal.        Judgment: Judgment normal.         Assessment And Plan:     1. Annual physical exam Behavior modifications discussed and diet history reviewed.   Pt will continue to exercise regularly and modify diet with low GI, plant based foods and decrease intake of processed foods.  Recommend intake of daily multivitamin, Vitamin D, and calcium.  Recommend mammogram and colonoscopy for preventive screenings, as well as recommend immunizations that include influenza, TDAP, and Shingles (she will come back in for nurse visit for her 1st shingrix) - CBC no Diff - CMP14+EGFR - Lipid panel  2. Encounter for immunization Comments: Prevnar 20 given in office - Pneumococcal conjugate vaccine 20-valent (Prevnar 20)  3. Dizziness Comments: Intermittent dizziness, will check TSH.  - TSH  4. Osteopenia, unspecified location Comments: Continue low impact walking and vitamin d supplement  5. Hepatic fibrosis Comments: Continue follow up with Charlaine Dalton (liver provider)  6. Hepatitis C carrier ([place])  7. Thrombocytopenia ([place])     Patient was given opportunity to ask questions. Patient verbalized understanding of the plan and was able to repeat key elements of the plan. All questions were answered to their  satisfaction.   Taylor Brine, FNP   I, Taylor Brine, FNP, have reviewed all documentation for this visit. The documentation on 04/18/22 for the exam, diagnosis, procedures, and orders are all accurate and complete.   THE PATIENT IS ENCOURAGED TO PRACTICE SOCIAL DISTANCING DUE TO THE COVID-19 PANDEMIC.

## 2022-04-19 LAB — CMP14+EGFR
ALT: 36 IU/L — ABNORMAL HIGH (ref 0–32)
AST: 44 IU/L — ABNORMAL HIGH (ref 0–40)
Albumin/Globulin Ratio: 1.3 (ref 1.2–2.2)
Albumin: 4.8 g/dL (ref 3.9–4.9)
Alkaline Phosphatase: 105 IU/L (ref 44–121)
BUN/Creatinine Ratio: 15 (ref 12–28)
BUN: 13 mg/dL (ref 8–27)
Bilirubin Total: 0.8 mg/dL (ref 0.0–1.2)
CO2: 24 mmol/L (ref 20–29)
Calcium: 9.9 mg/dL (ref 8.7–10.3)
Chloride: 101 mmol/L (ref 96–106)
Creatinine, Ser: 0.84 mg/dL (ref 0.57–1.00)
Globulin, Total: 3.7 g/dL (ref 1.5–4.5)
Glucose: 118 mg/dL — ABNORMAL HIGH (ref 70–99)
Potassium: 4.7 mmol/L (ref 3.5–5.2)
Sodium: 140 mmol/L (ref 134–144)
Total Protein: 8.5 g/dL (ref 6.0–8.5)
eGFR: 77 mL/min/{1.73_m2} (ref >59)

## 2022-04-19 LAB — LIPID PANEL
Chol/HDL Ratio: 3.3 ratio (ref 0.0–4.4)
Cholesterol, Total: 193 mg/dL (ref 100–199)
HDL: 58 mg/dL (ref >39)
LDL Chol Calc (NIH): 113 mg/dL — ABNORMAL HIGH (ref 0–99)
Triglycerides: 126 mg/dL (ref 0–149)
VLDL Cholesterol Cal: 22 mg/dL (ref 5–40)

## 2022-04-19 LAB — CBC
Hematocrit: 36.5 % (ref 34.0–46.6)
Hemoglobin: 12.3 g/dL (ref 11.1–15.9)
MCH: 30 pg (ref 26.6–33.0)
MCHC: 33.7 g/dL (ref 31.5–35.7)
MCV: 89 fL (ref 79–97)
Platelets: 109 10*3/uL — ABNORMAL LOW (ref 150–450)
RBC: 4.1 x10E6/uL (ref 3.77–5.28)
RDW: 11.8 % (ref 11.7–15.4)
WBC: 5 10*3/uL (ref 3.4–10.8)

## 2022-04-19 LAB — TSH: TSH: 2.16 u[IU]/mL (ref 0.450–4.500)

## 2022-04-29 IMAGING — RF DG ESOPHAGUS
5 series · 14 of 17 positions shown · non-contrast
Comparison: None.

CLINICAL DATA: Dysphagia with globus sensation in the throat and
upper chest particularly when swallowing Nakabayashi.

EXAM:
ESOPHOGRAM / BARIUM SWALLOW / BARIUM TABLET STUDY
TECHNIQUE: Combined double contrast and single contrast examination performed
using effervescent crystals, thick barium liquid, and thin barium
liquid. The patient was observed with fluoroscopy swallowing a 13 mm
barium sulphate tablet. Images obtained by Ah Teck Hema, PA.
Examination supervised and interpreted by radiologist Jade. Ezugwu
Gue.
FLUOROSCOPY TIME:  Fluoroscopy Time:  2 minutes 42 seconds
Radiation Exposure Index (if provided by the fluoroscopic device):
19 mGy
Number of Acquired Spot Images: 3

[Series 1: one shot · 2 of 2 slices shown (1 of 2)]
[im 1/2]
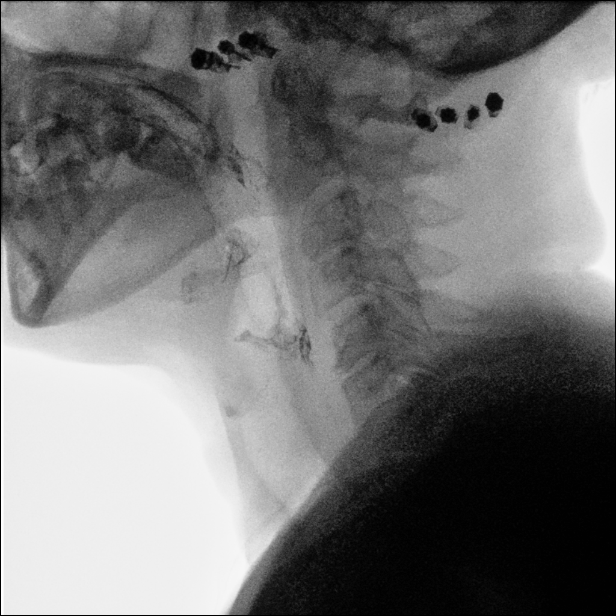
[im 2/2]
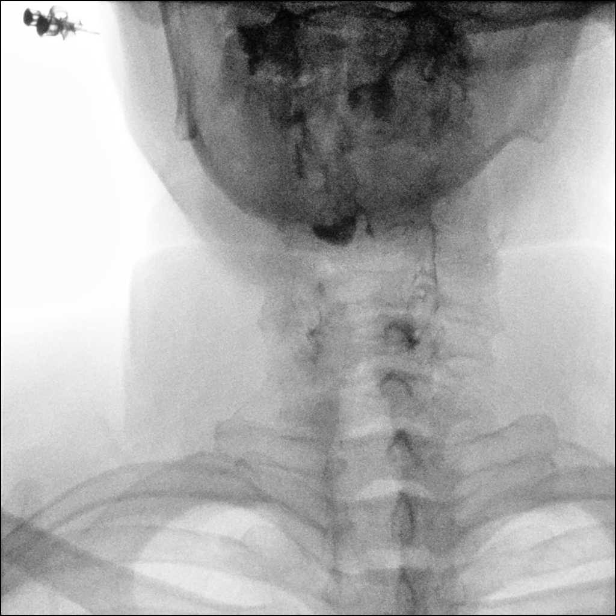

[Series 2: sequence · 3 of 115 frames shown (1 of 3)]
[frame 41/115]
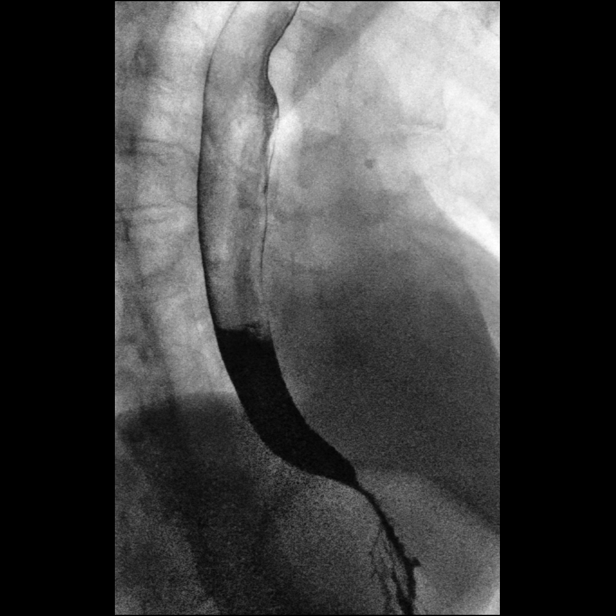
[frame 58/115]
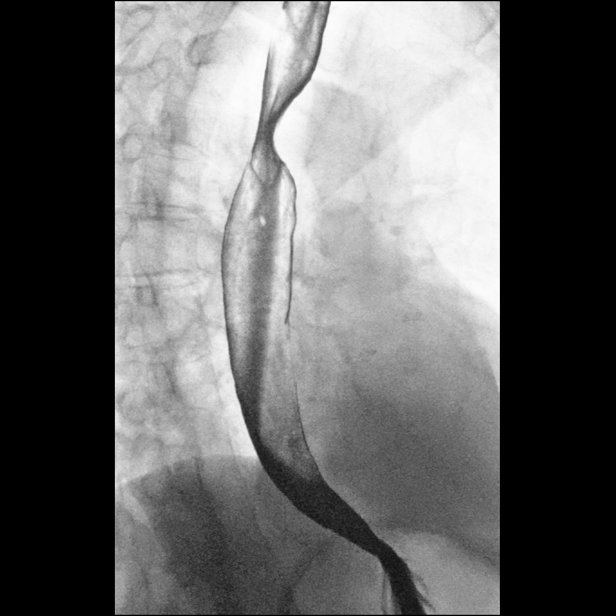
[frame 98/115]
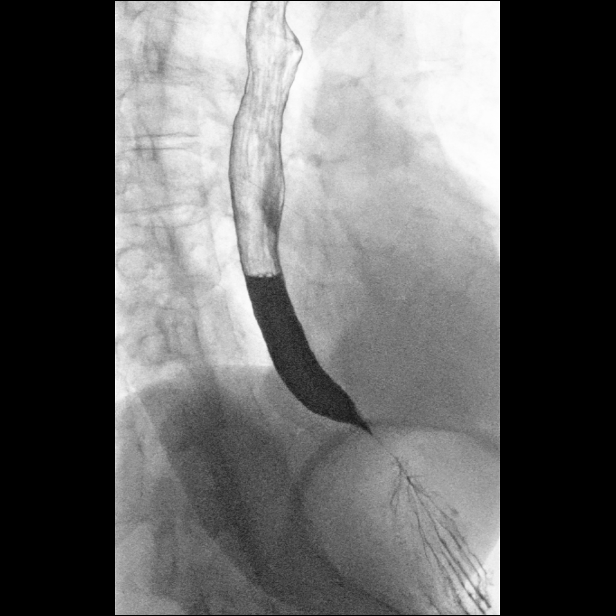

[Series 3: one shot · 0.15mm/px · 2 of 3 slices shown (2 of 2)]
[im 1/3]
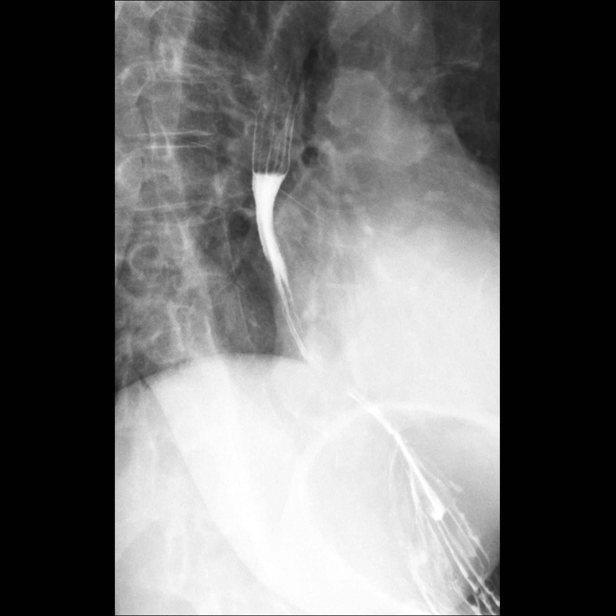
[im 2/3]
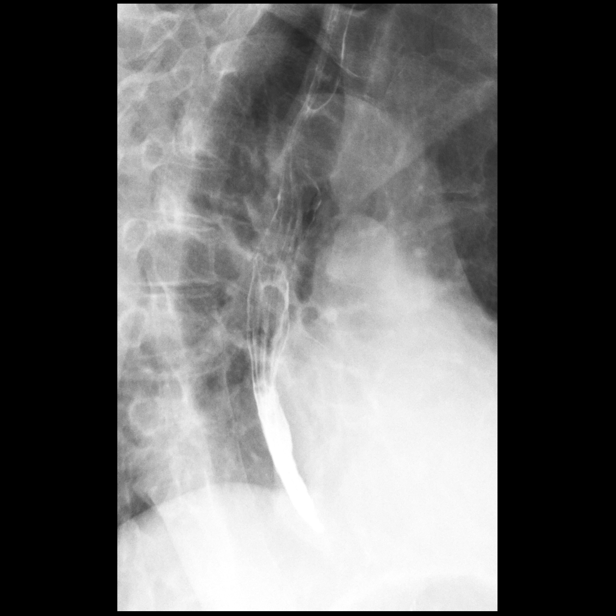

[Series 4: sequence · 4 of 45 frames shown (2 of 3)]
[frame 7/45]
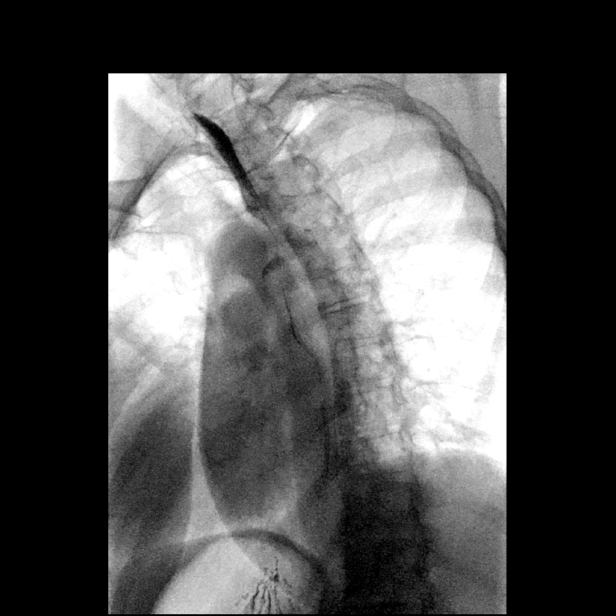
[frame 23/45]
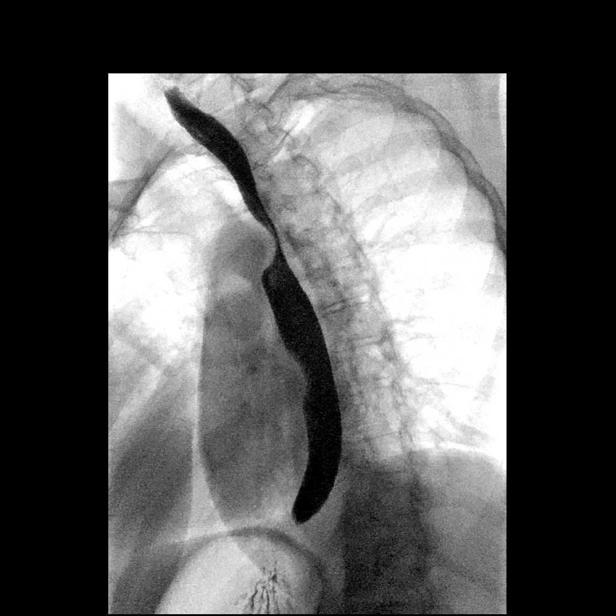
[frame 25/45]
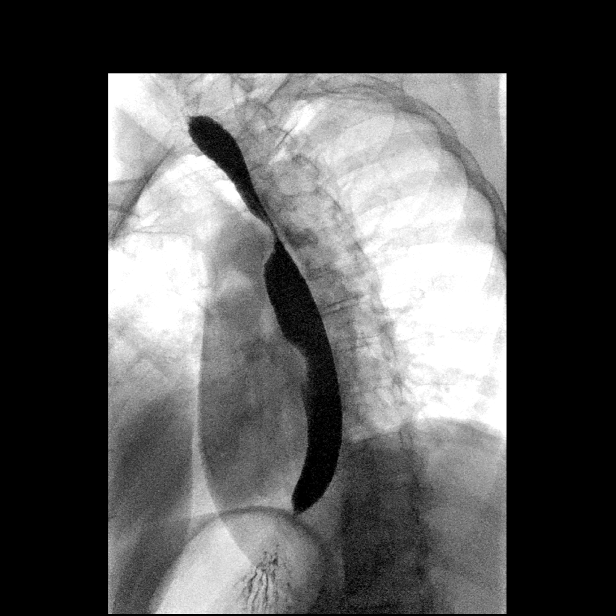
[frame 39/45]
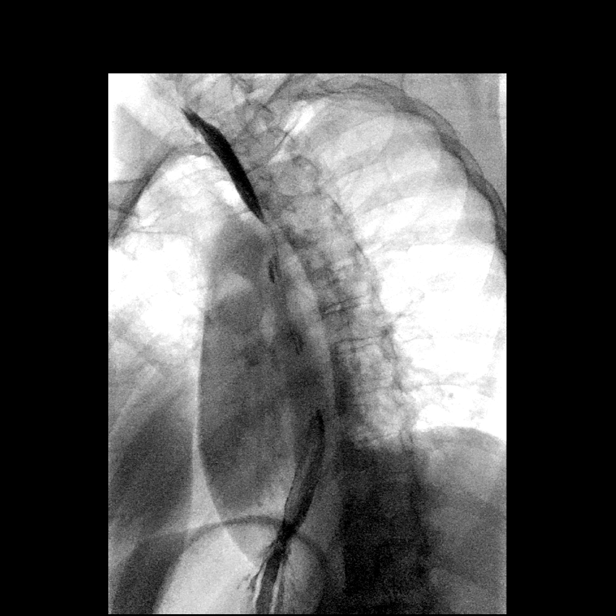

[Series 5: sequence · 3 of 76 frames shown (3 of 3)]
[frame 12/76]
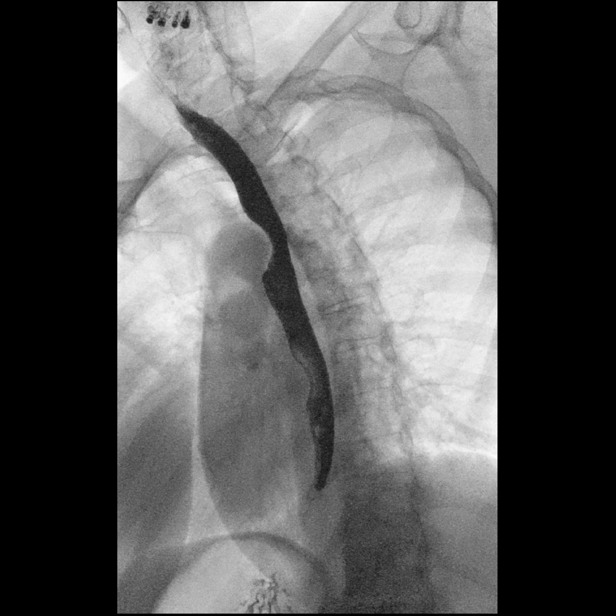
[frame 39/76]
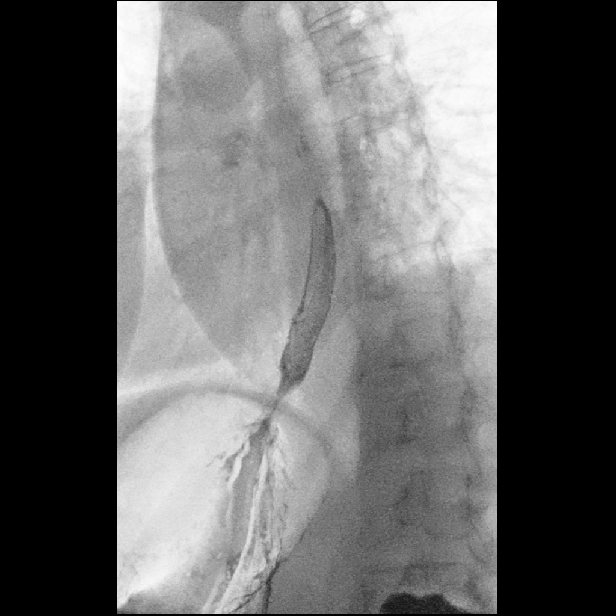
[frame 65/76]
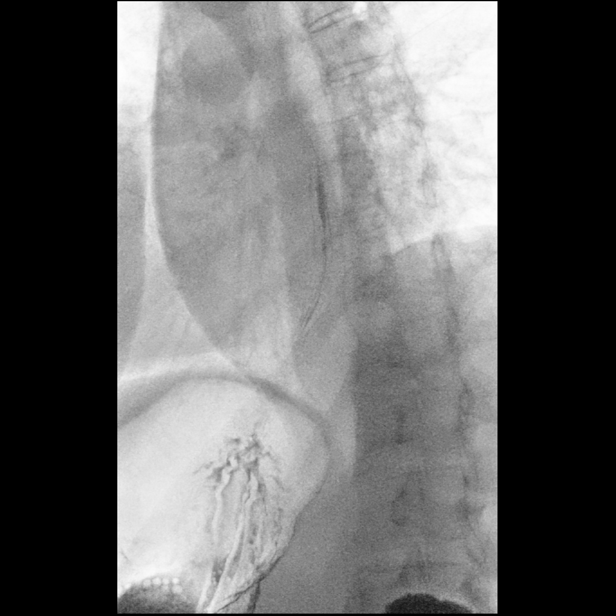

[14 of 17 positions shown; findings below may reference images not displayed]

FINDINGS: Normal oral and pharyngeal phases of swallowing, with no laryngeal
penetration or tracheobronchial aspiration.

Normal esophageal motility. No hiatal hernia. No gastroesophageal
reflux elicited, despite provocative maneuvers including water
siphon test and Valsalva maneuver. Normal esophageal mucosa, with no
evidence of reflux esophagitis. Normal esophageal distensibility,
with no evidence of esophageal mass, ulcer or stricture. Barium
tablet traversed the esophagus into the stomach without delay.
IMPRESSION: Normal esophagram. No hiatal hernia. No gastroesophageal reflux
elicited. Normal esophageal motility.

## 2022-05-10 ENCOUNTER — Ambulatory Visit (INDEPENDENT_AMBULATORY_CARE_PROVIDER_SITE_OTHER): Payer: Medicare Other

## 2022-05-10 VITALS — BP 122/62 | HR 64 | Temp 98.3°F

## 2022-05-10 DIAGNOSIS — Z23 Encounter for immunization: Secondary | ICD-10-CM

## 2022-05-10 NOTE — Progress Notes (Signed)
Patient presents today for 1st shingles vaccine.  

## 2022-05-30 ENCOUNTER — Other Ambulatory Visit: Payer: Self-pay

## 2022-05-30 DIAGNOSIS — Z139 Encounter for screening, unspecified: Secondary | ICD-10-CM

## 2022-05-30 LAB — GLUCOSE, POCT (MANUAL RESULT ENTRY): POC Glucose: 119 mg/dl — AB (ref 70–99)

## 2022-06-01 NOTE — Congregational Nurse Program (Signed)
Seen at [facility] screening clinic.  BP reading discussed, .  Followup with me.  Vinnie Langton, RN Congregational Nurse [phone]

## 2022-06-09 ENCOUNTER — Other Ambulatory Visit: Payer: Self-pay | Admitting: Internal Medicine

## 2022-06-09 DIAGNOSIS — Z1231 Encounter for screening mammogram for malignant neoplasm of breast: Secondary | ICD-10-CM

## 2022-07-04 ENCOUNTER — Ambulatory Visit: Payer: Medicare Other

## 2022-07-10 NOTE — Congregational Nurse Program (Unsigned)
BP 157/76, p. 79.  Seen at [facility].  Reports has made arrangements for funeral, has insurance.  Has [employer], is to have shingles vaccine on Tuesday.  Asked to go by drug store and check BP next week and call me with results.  Vinnie Langton, [facility]  [phone]

## 2022-07-12 ENCOUNTER — Ambulatory Visit: Payer: Medicare Other

## 2022-07-21 ENCOUNTER — Ambulatory Visit (INDEPENDENT_AMBULATORY_CARE_PROVIDER_SITE_OTHER): Payer: Medicare Other

## 2022-07-21 VITALS — BP 118/78 | Temp 98.1°F | Ht 64.0 in | Wt 136.0 lb

## 2022-07-21 DIAGNOSIS — Z23 Encounter for immunization: Secondary | ICD-10-CM

## 2022-07-21 NOTE — Patient Instructions (Signed)

## 2022-07-21 NOTE — Progress Notes (Signed)
Patient presents today for second shingrix injection.

## 2022-08-01 ENCOUNTER — Ambulatory Visit
Admission: RE | Admit: 2022-08-01 | Discharge: 2022-08-01 | Disposition: A | Discharge status: Home / Self Care | Payer: Medicare Other | Source: Ambulatory Visit | Attending: Internal Medicine | Admitting: Internal Medicine

## 2022-08-01 DIAGNOSIS — Z1231 Encounter for screening mammogram for malignant neoplasm of breast: Secondary | ICD-10-CM

## 2022-08-11 ENCOUNTER — Ambulatory Visit (INDEPENDENT_AMBULATORY_CARE_PROVIDER_SITE_OTHER): Payer: Medicare Other

## 2022-08-11 VITALS — BP 134/80 | HR 67 | Temp 98.1°F | Ht 64.0 in | Wt 136.0 lb

## 2022-08-11 DIAGNOSIS — Z23 Encounter for immunization: Secondary | ICD-10-CM | POA: Diagnosis not present

## 2022-08-11 NOTE — Progress Notes (Signed)
Patient presents today for HD flu.

## 2022-08-11 NOTE — Patient Instructions (Signed)

## 2022-08-14 IMAGING — US US ABDOMEN LIMITED
1 series · 14 of 25 positions shown · non-contrast
Comparison: Right upper quadrant ultrasound 02/22/2021 and
03/31/2017.

CLINICAL DATA: History of [place] steatosis and fibrosis.

EXAM:
ULTRASOUND ABDOMEN LIMITED RIGHT UPPER QUADRANT

[Series 1: us abdomen limited · 0.19mm/px · 14 of 33 slices shown]
[im 1/33]
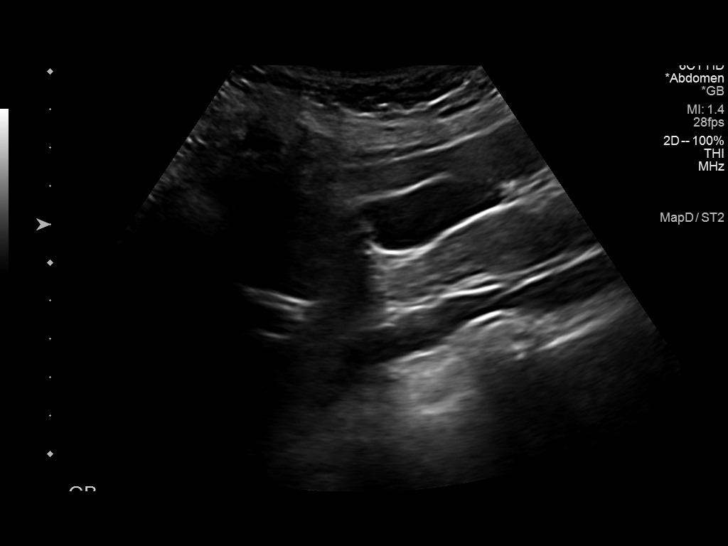
[im 3/33]
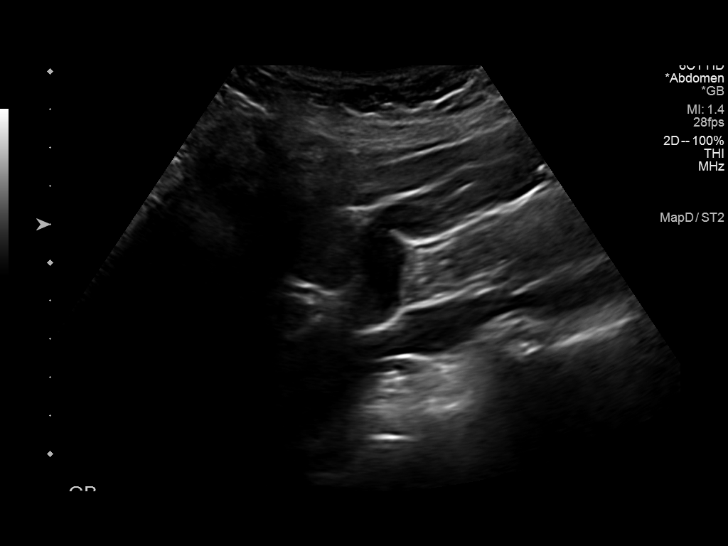
[im 6/33]
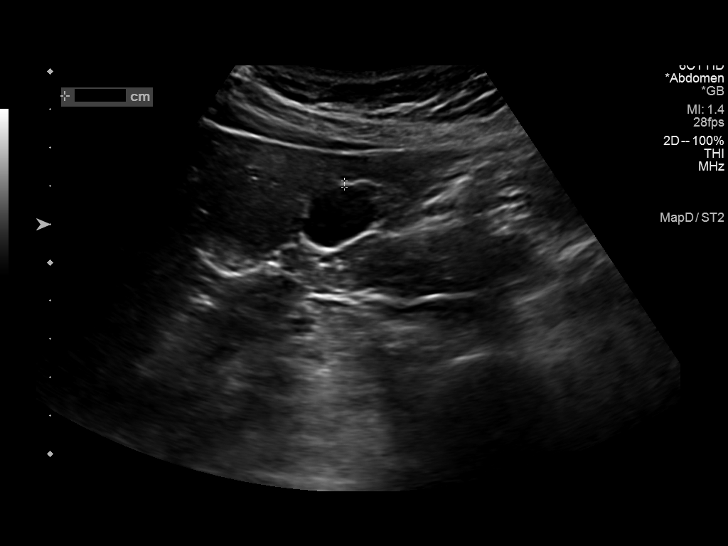
[im 9/33]
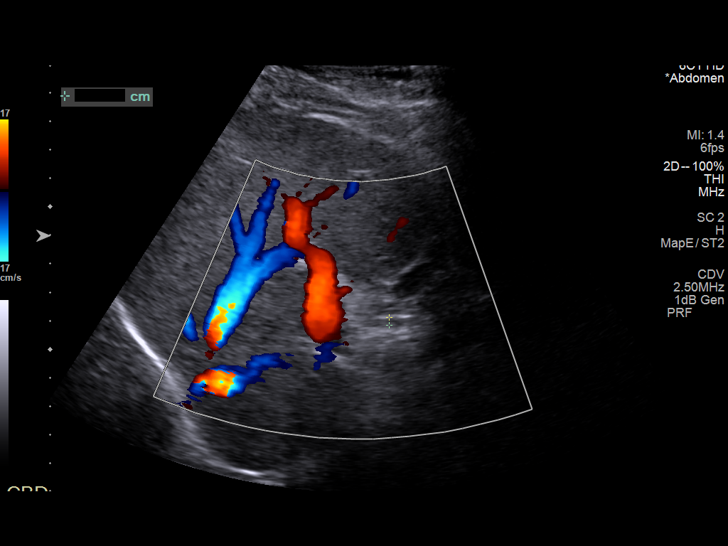
[im 11/33]
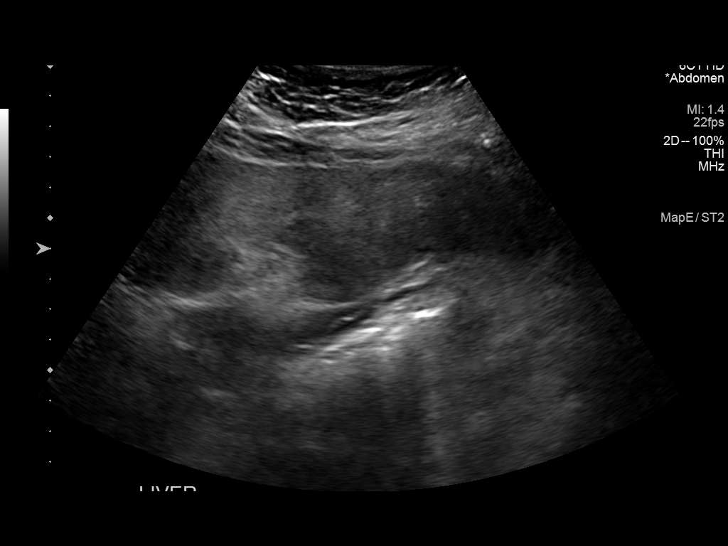
[im 13/33]
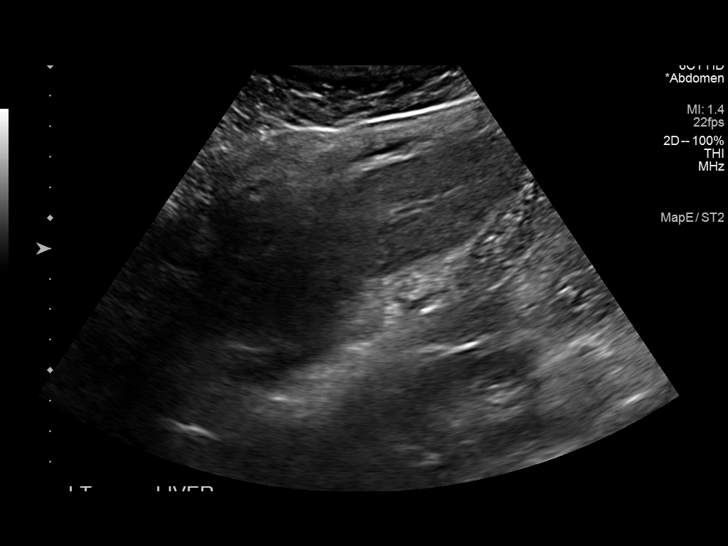
[im 15/33]
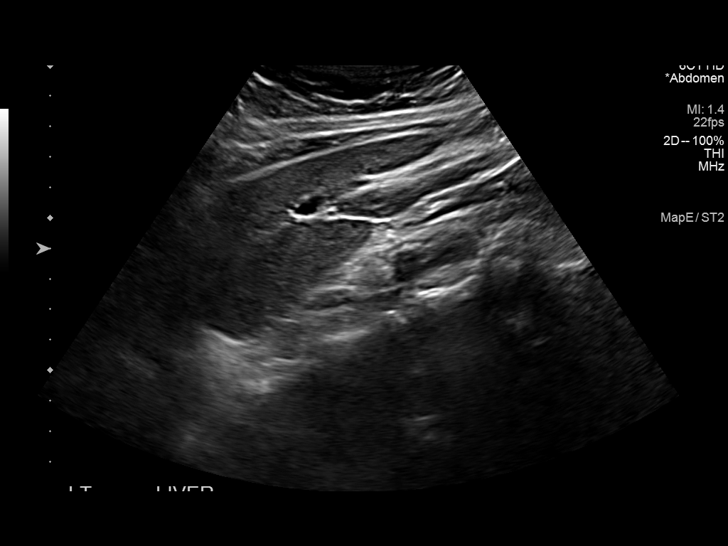
[im 18/33]
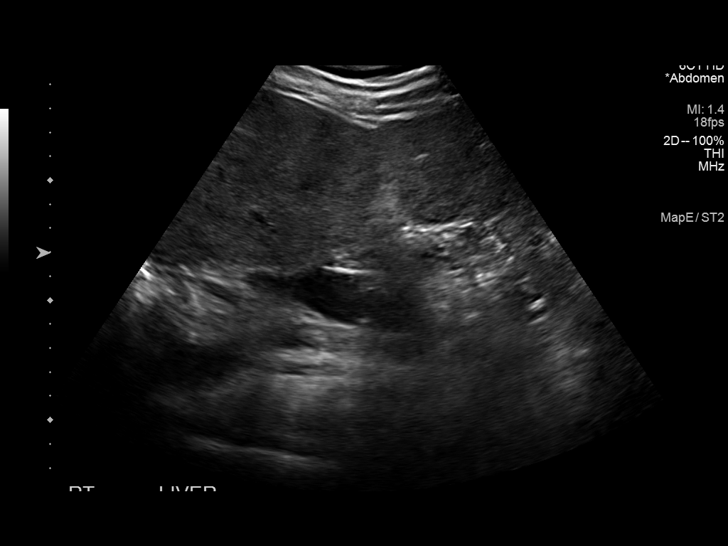
[im 21/33]
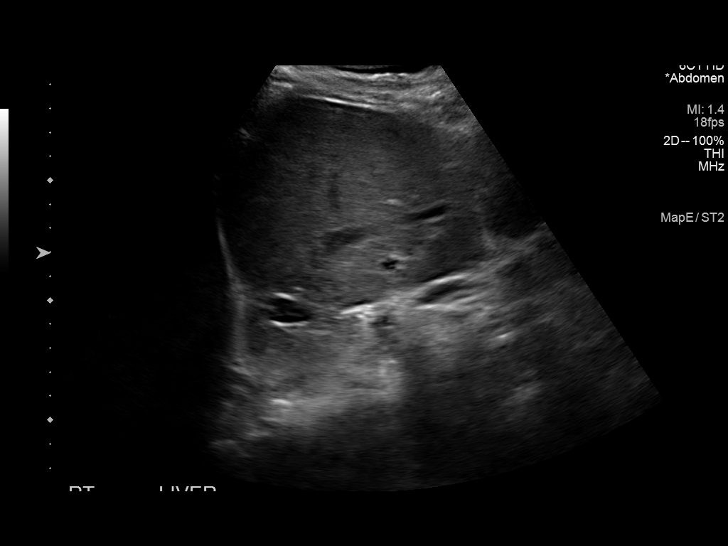
[im 22/33]
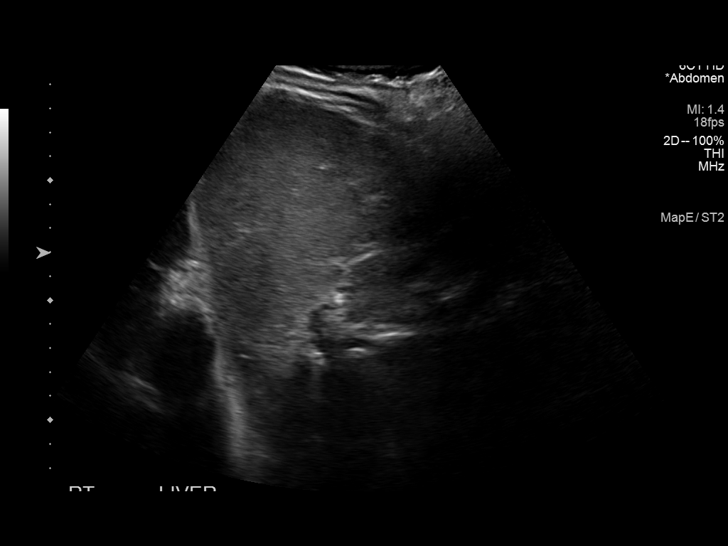
[im 25/33]
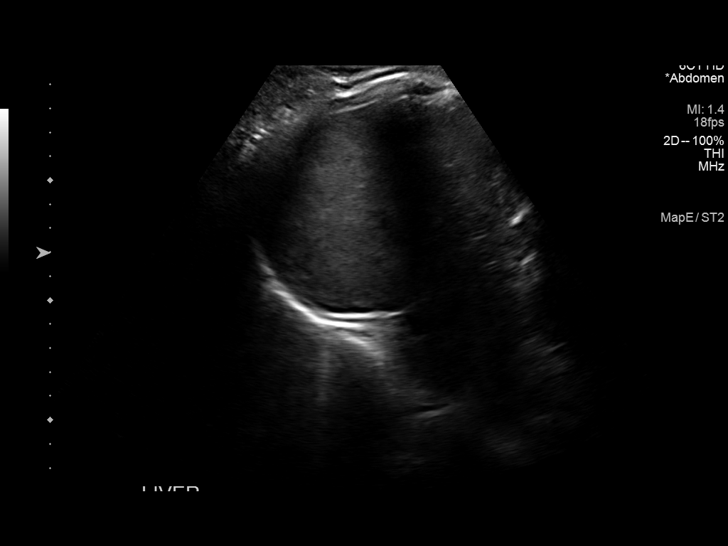
[im 27/33]
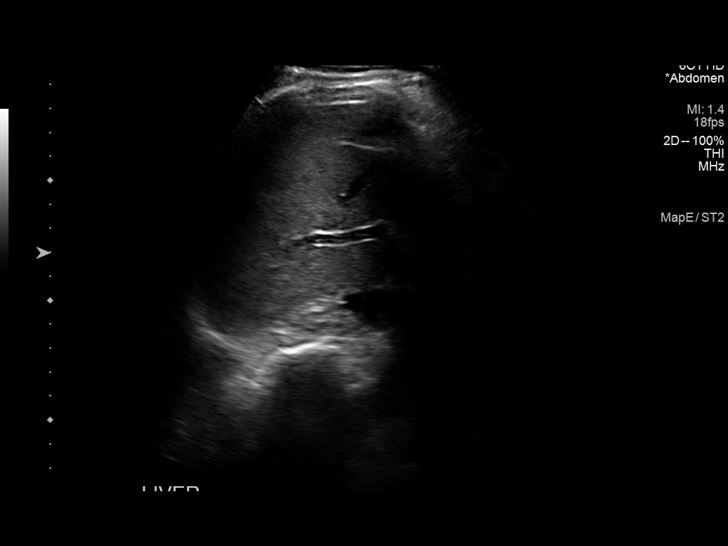
[im 30/33]
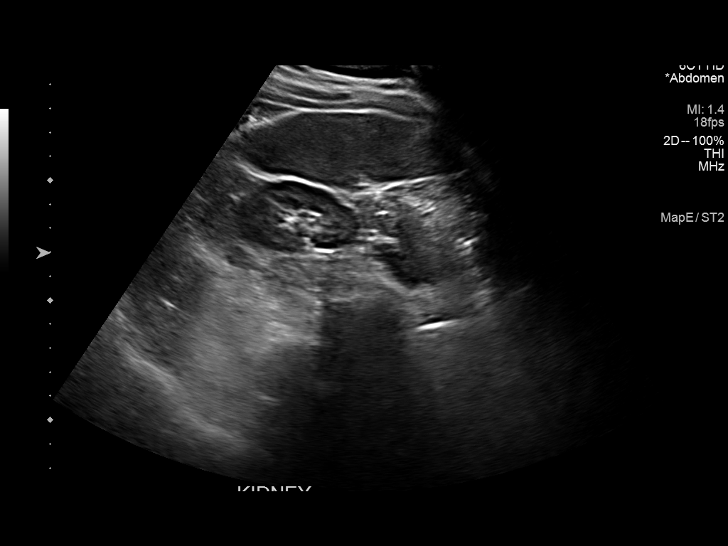
[im 33/33]
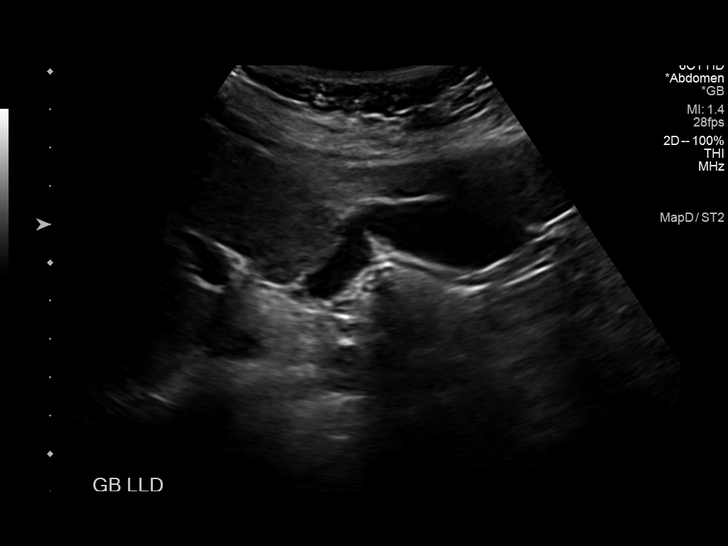

[14 of 25 positions shown; findings below may reference images not displayed]

FINDINGS: Gallbladder:

No gallstones or wall thickening visualized. No sonographic Murphy
sign noted by sonographer.

Common bile duct:

Diameter: 0.3 cm.

Liver:

No focal lesion identified. Again seen is mildly increased
echogenicity of the liver and somewhat heterogeneous echotexture.
Within normal limits in parenchymal echogenicity. Portal vein is
patent on color Doppler imaging with normal direction of blood flow
towards the liver.

Other: None.
IMPRESSION: No change in mildly heterogeneous appearance of the liver with
somewhat increased echogenicity compatible with fatty infiltration.
No new abnormality.

## 2022-08-24 ENCOUNTER — Ambulatory Visit: Payer: Self-pay

## 2022-08-24 NOTE — Patient Outreach (Signed)
  Care Coordination   Initial Visit Note   08/24/2022 Name: Taylor Santiago MRN: [id number] DOB: 02/25/1956  Taylor Santiago is a 66 y.o. year old female who sees Taylor Chard, MD for primary care. I spoke with  Taylor Santiago by phone today.  What matters to the patients health and wellness today?  No concerns, doing well at this time    Goals Addressed             This Visit's Progress    COMPLETED: Care Coordination Activities       Care Coordination Interventions: SDoH screening performed - no acute resource challenges identified at this time Determined the patient does not have concerns with medication costs and/or adherence Instructed the patient to contact her primary care provider as needed         SDOH assessments and interventions completed:  Yes  SDOH Interventions Today    Flowsheet Row Most Recent Value  SDOH Interventions   Food Insecurity Interventions Intervention Not Indicated  Housing Interventions Intervention Not Indicated  Transportation Interventions Intervention Not Indicated  Utilities Interventions Intervention Not Indicated        Care Coordination Interventions Activated:  Yes  Care Coordination Interventions:  Yes, provided   Follow up plan: No further intervention required.   Encounter Outcome:  Pt. Visit Completed   Taylor Santiago, BSW, CDP Social Worker, Certified Dementia Practitioner [facility]  Care Coordination [phone]

## 2022-08-24 NOTE — Patient Instructions (Signed)
Visit Information  Thank you for taking time to visit with me today. Please don't hesitate to contact me if I can be of assistance to you.   Following are the goals we discussed today:   Goals Addressed             This Visit's Progress    COMPLETED: Care Coordination Activities       Care Coordination Interventions: SDoH screening performed - no acute resource challenges identified at this time Determined the patient does not have concerns with medication costs and/or adherence Instructed the patient to contact her primary care provider as needed        If you are experiencing a Mental Health or [place] or need someone to talk to, please call 1-800-273-TALK (toll free, 24 hour hotline)  Patient verbalizes understanding of instructions and care plan provided today and agrees to view in [place]. Active MyChart status and patient understanding of how to access instructions and care plan via MyChart confirmed with patient.     No further follow up required: Please contact your primary care provider as needed  Daneen Schick, Arita Miss, CDP Social Worker, Certified Dementia Practitioner [place] Coordination [phone]

## 2022-08-30 ENCOUNTER — Ambulatory Visit (HOSPITAL_COMMUNITY)
Admission: EM | Admit: 2022-08-30 | Discharge: 2022-08-30 | Disposition: A | Discharge status: Home / Self Care | Payer: Medicare Other | Attending: Internal Medicine | Admitting: Internal Medicine

## 2022-08-30 ENCOUNTER — Encounter (HOSPITAL_COMMUNITY): Payer: Self-pay | Admitting: Emergency Medicine

## 2022-08-30 DIAGNOSIS — R52 Pain, unspecified: Secondary | ICD-10-CM | POA: Insufficient documentation

## 2022-08-30 DIAGNOSIS — J069 Acute upper respiratory infection, unspecified: Secondary | ICD-10-CM | POA: Diagnosis not present

## 2022-08-30 DIAGNOSIS — Z1152 Encounter for screening for COVID-19: Secondary | ICD-10-CM | POA: Diagnosis not present

## 2022-08-30 NOTE — Discharge Instructions (Signed)
You have a viral upper respiratory infection.  COVID-19 testing is pending. We will call you with results if positive. If your COVID test is positive, you must stay at home until day 6 of symptoms. On day 6, you may go out into public and go back to work, but you must wear a mask until day 11 of symptoms to prevent spread to others.  Take guaifenesin '1200mg'$   every 12 hours to thin your mucous so that you can get it out of your body easier with coughing/blowing your nose. Drink plenty of water while taking this medication so that it works well in your body (at least 8 cups a day).   Take tessalon pearles every 8 hours as needed for cough.  You may take tylenol 1,'000mg'$  and ibuprofen '600mg'$  every 6 hours with food as needed for fever/chills, sore throat, aches/pains, and inflammation associated with viral illness. Take this with food to avoid stomach upset.    You may do salt water and baking soda gargles every 4 hours as needed for your throat pain.  Please put 1 teaspoon of salt and 1/2 teaspoon of baking soda in 8 ounces of warm water then gargle and spit the water out. You may also put 1 tablespoon of honey in warm water and drink this to soothe your throat.  Place a humidifier in your room at night to help decrease dry air that can irritate your airway and cause you to have a sore throat and cough.  Please try to eat a well-balanced diet while you are sick so that your body gets proper nutrition to heal.  If you develop any new or worsening symptoms, please return.  If your symptoms are severe, please go to the emergency room.  Follow-up with your primary care provider for further evaluation and management of your symptoms as well as ongoing wellness visits.  I hope you feel better!

## 2022-08-30 NOTE — ED Provider Notes (Signed)
[place]    CSN: [id number] Arrival date & time: 08/30/22  1514      History   Chief Complaint Chief Complaint  Patient presents with   Generalized Body Aches    HPI Taylor Santiago is a 66 y.o. female.   Patient presents urgent care for evaluation of cough, nasal congestion, and sore throat that started 2 days ago on August 28, 2022.  She denies headache, fever/chills, ear pain, shortness of breath, chest pain, weakness, nausea, vomiting, abdominal pain, urinary symptoms, and dizziness.  No known sick contacts.  She is vaccinated against COVID-19 and states that this "does not feel like with the last time she had COVID-19".  She would like to be tested for COVID-19 today just in case.  She has been using TheraFlu, NyQuil, and some other over-the-counter cold and flu medications for her symptoms and states that this has been helping significantly.  She has some leftover Tessalon Perles from a previous upper respiratory infection and states that she has not used these quite yet but she plans to because they helped a lot last time she was sick.  Cough is mostly dry and nonproductive.  Nasal congestion is thick and makes it difficult for her to breathe through her nose.  Denies history of chronic respiratory problems.  She is not a smoker and denies drug use.     Past Medical History:  Diagnosis Date   Abnormal LFTs (liver function tests)    Cirrhosis ([place])    Hepatitis C virus infection    Patient stated she has been treated   Hepatitis C, chronic ([place]) 2004   genotype 1a   Hyperlipidemia    Medical history non-contributory    Thrombocytopenia ([place])     Patient Active Problem List   Diagnosis Date Noted   Hepatitis C carrier ([place]) 04/18/2022   Osteoarthritis of hands, bilateral 06/09/2021   Rheumatoid factor positive 05/25/2021   Thrombocytopenia ([place]) 04/05/2021   Other chest pain 04/05/2021   Arthralgia 04/05/2021   History of tobacco use 04/05/2021    Smoker 02/01/2021   Hepatic steatosis 02/01/2021   Age-related osteoporosis without current pathological fracture 10/02/2018   Achilles tendinitis of left lower extremity 02/24/2016   Left hand weakness 05/15/2014   Numbness and tingling in left arm 05/15/2014   Hepatitis C virus infection cured after antiviral drug therapy 05/15/2014    Past Surgical History:  Procedure Laterality Date   ABDOMINAL HYSTERECTOMY     APPENDECTOMY     COLONOSCOPY  2013   IR TRANSCATHETER BX  07/09/2019   IR US GUIDE VASC ACCESS RIGHT  07/09/2019   IR VENOGRAM HEPATIC W HEMODYNAMIC EVALUATION  07/09/2019   PARTIAL HYSTERECTOMY      OB History     Gravida  1   Para  1   Term      Preterm      AB      Living  1      SAB      IAB      Ectopic      Multiple      Live Births               Home Medications    Prior to Admission medications   Not on File    Family History Family History  Problem Relation Age of Onset   Hypertension Mother    Diabetes Mother    Ovarian cancer Mother    Heart Problems Brother  Throat cancer Brother    Heart disease Maternal Grandmother    Heart disease Maternal Grandfather    Healthy Daughter    Colon cancer Neg Hx    Breast cancer Neg Hx     Social History Social History   Tobacco Use   Smoking status: Former    Packs/day: 0.50    Years: 26.00    Total pack years: 13.00    Types: Cigarettes    Start date: 58    Quit date: 2000    Years since quitting: 23.9   Smokeless tobacco: Never  Vaping Use   Vaping Use: Never used  Substance Use Topics   Alcohol use: Not Currently    Comment: Occasionally   Drug use: No     Allergies   Hydrocodone, Penicillins, Percocet [oxycodone-acetaminophen], and Prednisone   Review of Systems Review of Systems Per HPI  Physical Exam Triage Vital Signs ED Triage Vitals  Enc Vitals Group     BP 08/30/22 1526 138/80     Pulse Rate 08/30/22 1526 79     Resp 08/30/22 1526 18      Temp 08/30/22 1526 98.5 F (36.9 C)     Temp Source 08/30/22 1526 Oral     SpO2 08/30/22 1526 98 %     Weight --      Height --      Head Circumference --      Peak Flow --      Pain Score 08/30/22 1525 8     Pain Loc --      Pain Edu? --      Excl. in [place]? --    No data found.  Updated Vital Signs BP 138/80 (BP Location: Left Arm)   Pulse 79   Temp 98.5 F (36.9 C) (Oral)   Resp 18   SpO2 98%   Visual Acuity Right Eye Distance:   Left Eye Distance:   Bilateral Distance:    Right Eye Near:   Left Eye Near:    Bilateral Near:     Physical Exam Vitals and nursing note reviewed.  Constitutional:      Appearance: She is not ill-appearing or toxic-appearing.  HENT:     Head: Normocephalic and atraumatic.     Right Ear: Hearing, tympanic membrane, ear canal and external ear normal.     Left Ear: Hearing, tympanic membrane, ear canal and external ear normal.     Nose: Congestion and rhinorrhea present.     Mouth/Throat:     Lips: Pink.     Mouth: Mucous membranes are moist.     Pharynx: No posterior oropharyngeal erythema.  Eyes:     General: Lids are normal. Vision grossly intact. Gaze aligned appropriately.        Right eye: No discharge.        Left eye: No discharge.     Extraocular Movements: Extraocular movements intact.     Conjunctiva/sclera: Conjunctivae normal.     Pupils: Pupils are equal, round, and reactive to light.  Cardiovascular:     Rate and Rhythm: Normal rate and regular rhythm.     Heart sounds: Normal heart sounds, S1 normal and S2 normal.  Pulmonary:     Effort: Pulmonary effort is normal. No respiratory distress.     Breath sounds: Normal breath sounds and air entry. No wheezing, rhonchi or rales.  Abdominal:     General: Bowel sounds are normal.     Palpations: Abdomen is soft.  Tenderness: There is no abdominal tenderness. There is no right CVA tenderness, left CVA tenderness or guarding.  Musculoskeletal:     Cervical back: Neck  supple.     Right lower leg: No edema.     Left lower leg: No edema.  Lymphadenopathy:     Cervical: No cervical adenopathy.  Skin:    General: Skin is warm and dry.     Capillary Refill: Capillary refill takes less than 2 seconds.     Findings: No rash.  Neurological:     General: No focal deficit present.     Mental Status: She is alert and oriented to person, place, and time. Mental status is at baseline.     Cranial Nerves: No dysarthria or facial asymmetry.     Motor: No weakness.     Gait: Gait normal.  Psychiatric:        Mood and Affect: Mood normal.        Speech: Speech normal.        Behavior: Behavior normal.        Thought Content: Thought content normal.        Judgment: Judgment normal.      UC Treatments / Results  Labs (all labs ordered are listed, but only abnormal results are displayed) Labs Reviewed  SARS CORONAVIRUS 2 (TAT 6-24 HRS)    EKG   Radiology No results found.  Procedures Procedures (including critical care time)  Medications Ordered in UC Medications - No data to display  Initial Impression / Assessment and Plan / UC Course  I have reviewed the triage vital signs and the nursing notes.  Pertinent labs & imaging results that were available during my care of the patient were reviewed by me and considered in my medical decision making (see chart for details).   Viral URI with cough Symptoms and physical exam consistent with a viral upper respiratory tract infection that will likely resolve with rest, fluids, and prescriptions for symptomatic relief. No indication for imaging today based on stable cardiopulmonary exam and hemodynamically stable vital signs.  COVID-19 testing is pending per patient request.  We will call patient if this is positive.  Quarantine guidelines discussed. Currently on day 3 of symptoms and does qualify for antiviral therapy.   May continue using guaifenesin at home to help with nasal mucus and congestion. May  use home supply of Tessalon Perles every 8 hours as needed for cough.  May use ibuprofen/tylenol over the counter for body aches, fever/chills, and overall discomfort associated with viral illness. Nonpharmacologic interventions for symptom relief provided and after visit summary below.   Strict ED/urgent care return precautions given.  Patient verbalizes understanding and agreement with plan.  Counseled patient regarding possible side effects and uses of all medications prescribed at today's visit.  Patient verbalizes understanding and agreement with plan.  All questions answered.  Patient discharged from urgent care in stable condition.        Final Clinical Impressions(s) / UC Diagnoses   Final diagnoses:  Viral URI with cough  Body aches     Discharge Instructions      You have a viral upper respiratory infection.  COVID-19 testing is pending. We will call you with results if positive. If your COVID test is positive, you must stay at home until day 6 of symptoms. On day 6, you may go out into public and go back to work, but you must wear a mask until day 11 of symptoms to prevent  spread to others.  Take guaifenesin '1200mg'$   every 12 hours to thin your mucous so that you can get it out of your body easier with coughing/blowing your nose. Drink plenty of water while taking this medication so that it works well in your body (at least 8 cups a day).   Take tessalon pearles every 8 hours as needed for cough.  You may take tylenol 1,'000mg'$  and ibuprofen '600mg'$  every 6 hours with food as needed for fever/chills, sore throat, aches/pains, and inflammation associated with viral illness. Take this with food to avoid stomach upset.    You may do salt water and baking soda gargles every 4 hours as needed for your throat pain.  Please put 1 teaspoon of salt and 1/2 teaspoon of baking soda in 8 ounces of warm water then gargle and spit the water out. You may also put 1 tablespoon of honey in warm  water and drink this to soothe your throat.  Place a humidifier in your room at night to help decrease dry air that can irritate your airway and cause you to have a sore throat and cough.  Please try to eat a well-balanced diet while you are sick so that your body gets proper nutrition to heal.  If you develop any new or worsening symptoms, please return.  If your symptoms are severe, please go to the emergency room.  Follow-up with your primary care provider for further evaluation and management of your symptoms as well as ongoing wellness visits.  I hope you feel better!    ED Prescriptions   None    PDMP not reviewed this encounter.   Talbot Grumbling, [place] 08/30/22 [phone]

## 2022-08-30 NOTE — ED Triage Notes (Signed)
Pt c/o body aches for couple days as well as some congestion. Taking medications but not helping and doesn't like taking medications.

## 2022-08-31 LAB — SARS CORONAVIRUS 2 (TAT 6-24 HRS): SARS Coronavirus 2: NEGATIVE

## 2022-11-07 NOTE — Congregational Nurse Program (Signed)
Brief encounter at [facility].  Enjoying service.  Spoke briefly after service.  Vinnie Langton, RN, [place] Nurse, [phone].

## 2022-11-29 ENCOUNTER — Other Ambulatory Visit: Payer: Self-pay | Admitting: Nurse Practitioner

## 2022-11-29 DIAGNOSIS — K760 Fatty (change of) liver, not elsewhere classified: Secondary | ICD-10-CM | POA: Diagnosis not present

## 2022-11-29 DIAGNOSIS — K7402 Hepatic fibrosis, advanced fibrosis: Secondary | ICD-10-CM | POA: Diagnosis not present

## 2022-11-29 DIAGNOSIS — Z8619 Personal history of other infectious and parasitic diseases: Secondary | ICD-10-CM | POA: Diagnosis not present

## 2022-12-05 ENCOUNTER — Encounter (HOSPITAL_COMMUNITY): Payer: Self-pay

## 2022-12-05 ENCOUNTER — Ambulatory Visit (HOSPITAL_COMMUNITY)
Admission: EM | Admit: 2022-12-05 | Discharge: 2022-12-05 | Disposition: A | Discharge status: Home / Self Care | Payer: 59 | Attending: Internal Medicine | Admitting: Internal Medicine

## 2022-12-05 DIAGNOSIS — Z87891 Personal history of nicotine dependence: Secondary | ICD-10-CM | POA: Diagnosis not present

## 2022-12-05 DIAGNOSIS — R197 Diarrhea, unspecified: Secondary | ICD-10-CM | POA: Insufficient documentation

## 2022-12-05 DIAGNOSIS — Z1152 Encounter for screening for COVID-19: Secondary | ICD-10-CM | POA: Diagnosis not present

## 2022-12-05 DIAGNOSIS — B349 Viral infection, unspecified: Secondary | ICD-10-CM | POA: Diagnosis not present

## 2022-12-05 DIAGNOSIS — R112 Nausea with vomiting, unspecified: Secondary | ICD-10-CM | POA: Insufficient documentation

## 2022-12-05 LAB — SARS CORONAVIRUS 2 (TAT 6-24 HRS): SARS Coronavirus 2: NEGATIVE

## 2022-12-05 MED ORDER — KETOROLAC TROMETHAMINE 30 MG/ML IJ SOLN
INTRAMUSCULAR | Status: AC
  Filled 2022-12-05: qty 1

## 2022-12-05 MED ORDER — KETOROLAC TROMETHAMINE 30 MG/ML IJ SOLN
15.0000 mg | Freq: Once | INTRAMUSCULAR | Status: AC
  Administered 2022-12-05: 15 mg via INTRAMUSCULAR

## 2022-12-05 NOTE — ED Provider Notes (Signed)
[name]    CSN: [id number] Arrival date & time: 12/05/22  0802      History   Chief Complaint Chief Complaint  Patient presents with  . Generalized Body Aches    HPI Taylor Santiago is a 67 y.o. female.   HPI  Past Medical History:  Diagnosis Date  . Abnormal LFTs (liver function tests)   . Cirrhosis ([place])   . Hepatitis C virus infection    Patient stated she has been treated  . Hepatitis C, chronic ([place]) 2004   genotype 1a  . Hyperlipidemia   . Medical history non-contributory   . Thrombocytopenia [facility])     Patient Active Problem List   Diagnosis Date Noted  . Hepatitis C carrier ([place]) 04/18/2022  . Osteoarthritis of hands, bilateral 06/09/2021  . Rheumatoid factor positive 05/25/2021  . Thrombocytopenia ([place]) 04/05/2021  . Other chest pain 04/05/2021  . Arthralgia 04/05/2021  . History of tobacco use 04/05/2021  . Smoker 02/01/2021  . Hepatic steatosis 02/01/2021  . Age-related osteoporosis without current pathological fracture 10/02/2018  . Achilles tendinitis of left lower extremity 02/24/2016  . Left hand weakness 05/15/2014  . Numbness and tingling in left arm 05/15/2014  . Hepatitis C virus infection cured after antiviral drug therapy 05/15/2014    Past Surgical History:  Procedure Laterality Date  . ABDOMINAL HYSTERECTOMY    . APPENDECTOMY    . COLONOSCOPY  2013  . IR TRANSCATHETER BX  07/09/2019  . IR US GUIDE VASC ACCESS RIGHT  07/09/2019  . IR VENOGRAM HEPATIC W HEMODYNAMIC EVALUATION  07/09/2019  . PARTIAL HYSTERECTOMY      OB History     Gravida  1   Para  1   Term      Preterm      AB      Living  1      SAB      IAB      Ectopic      Multiple      Live Births               Home Medications    Prior to Admission medications   Not on File    Family History Family History  Problem Relation Age of Onset  . Hypertension Mother   . Diabetes Mother   . Ovarian cancer Mother   . Heart  Problems Brother   . Throat cancer Brother   . Heart disease Maternal Grandmother   . Heart disease Maternal Grandfather   . Healthy Daughter   . Colon cancer Neg Hx   . Breast cancer Neg Hx     Social History Social History   Tobacco Use  . Smoking status: Former    Packs/day: 0.50    Years: 26.00    Total pack years: 13.00    Types: Cigarettes    Start date: 15    Quit date: 2000    Years since quitting: 24.1  . Smokeless tobacco: Never  Vaping Use  . Vaping Use: Never used  Substance Use Topics  . Alcohol use: Not Currently    Comment: Occasionally  . Drug use: No     Allergies   Hydrocodone, Penicillins, Percocet [oxycodone-acetaminophen], and Prednisone   Review of Systems Review of Systems   Physical Exam Triage Vital Signs ED Triage Vitals [12/05/22 0820]  Enc Vitals Group     BP (!) 154/83     Pulse Rate 67     Resp  18     Temp 98.5 F (36.9 C)     Temp Source Oral     SpO2 98 %     Weight      Height      Head Circumference      Peak Flow      Pain Score 8     Pain Loc      Pain Edu?      Excl. in [place]?    No data found.  Updated Vital Signs BP (!) 154/83 (BP Location: Right Arm)   Pulse 67   Temp 98.5 F (36.9 C) (Oral)   Resp 18   SpO2 98%   Visual Acuity Right Eye Distance:   Left Eye Distance:   Bilateral Distance:    Right Eye Near:   Left Eye Near:    Bilateral Near:     Physical Exam   UC Treatments / Results  Labs (all labs ordered are listed, but only abnormal results are displayed) Labs Reviewed  SARS CORONAVIRUS 2 (TAT 6-24 HRS)    EKG   Radiology No results found.  Procedures Procedures (including critical care time)  Medications Ordered in UC Medications  ketorolac (TORADOL) 30 MG/ML injection 15 mg (15 mg Intramuscular Given 12/05/22 0906)    Initial Impression / Assessment and Plan / UC Course  I have reviewed the triage vital signs and the nursing notes.  Pertinent labs & imaging results  that were available during my care of the patient were reviewed by me and considered in my medical decision making (see chart for details).     *** Final Clinical Impressions(s) / UC Diagnoses   Final diagnoses:  Viral syndrome  Nausea vomiting and diarrhea     Discharge Instructions      You have a viral upper respiratory infection.  COVID-19 testing is pending. We will call you with results if positive. If your COVID test is positive, you must stay at home until day 6 of symptoms. On day 6, you may go out into public and go back to work, but you must wear a mask until day 11 of symptoms to prevent spread to others.  Use the following medicines to help with symptoms: - Purchase cetirizine (Zyrtec) '10mg'$  and take once a day at bedtime to help dry up nasal congestion/drainage. - Use olopatadine eye drops as needed as directed for watery/itchy eyes. - Continue using ibuprofen '400mg'$  every 6 hours as needed for aches and pains.   No ibuprofen until tomorrow morning since I gave you the shot of medicine in the clinic.  If you develop any new or worsening symptoms, please return.  If your symptoms are severe, please go to the emergency room.  Follow-up with your primary care provider for further evaluation and management of your symptoms as well as ongoing wellness visits.  I hope you feel better!    ED Prescriptions   None    PDMP not reviewed this encounter.

## 2022-12-05 NOTE — ED Triage Notes (Addendum)
Pt states had dizziness and n/v/d on Saturday. States now just having body aches and runny nose and runny eyes. States drinking power aide and taking OTC meds with relief. Pt requesting a COVID test.

## 2022-12-05 NOTE — Discharge Instructions (Addendum)
You have a viral upper respiratory infection.  COVID-19 testing is pending. We will call you with results if positive. If your COVID test is positive, you must stay at home until day 6 of symptoms. On day 6, you may go out into public and go back to work, but you must wear a mask until day 11 of symptoms to prevent spread to others.  Use the following medicines to help with symptoms: - Purchase cetirizine (Zyrtec) '10mg'$  and take once a day at bedtime to help dry up nasal congestion/drainage. - Use olopatadine eye drops as needed as directed for watery/itchy eyes. - Continue using ibuprofen '400mg'$  every 6 hours as needed for aches and pains.   No ibuprofen until tomorrow morning since I gave you the shot of medicine in the clinic.  If you develop any new or worsening symptoms, please return.  If your symptoms are severe, please go to the emergency room.  Follow-up with your primary care provider for further evaluation and management of your symptoms as well as ongoing wellness visits.  I hope you feel better!

## 2022-12-10 NOTE — Congregational Nurse Program (Signed)
Seen at [facility].  BP 147/83.  P. 105.  Has been active in church worship.  Blood pressure consultation done.  To check BP this week and let me know results.  Vinnie Langton, RN, [place] Nurse, [phone]

## 2022-12-28 ENCOUNTER — Ambulatory Visit (INDEPENDENT_AMBULATORY_CARE_PROVIDER_SITE_OTHER): Payer: 59

## 2022-12-28 VITALS — Ht 64.0 in | Wt 131.0 lb

## 2022-12-28 DIAGNOSIS — Z0000 Encounter for general adult medical examination without abnormal findings: Secondary | ICD-10-CM | POA: Diagnosis not present

## 2022-12-28 NOTE — Patient Instructions (Signed)
Taylor Santiago , Thank you for taking time to come for your Medicare Wellness Visit. I appreciate your ongoing commitment to your health goals. Please review the following plan we discussed and let me know if I can assist you in the future.   These are the goals we discussed:  Goals      Patient Stated     12/23/2021 wants to start exercising and eat healthier     Patient Stated     12/28/2022, no goals        This is a list of the screening recommended for you and due dates:  Health Maintenance  Topic Date Due   COVID-19 Vaccine (4 - 2023-24 season) 06/10/2022   Medicare Annual Wellness Visit  12/28/2023   Mammogram  08/01/2024   Colon Cancer Screening  08/23/2028   DTaP/Tdap/Td vaccine (2 - Td or Tdap) 07/13/2030   Pneumonia Vaccine  Completed   Flu Shot  Completed   DEXA scan (bone density measurement)  Completed   Hepatitis C Screening: USPSTF Recommendation to screen - Ages 58-79 yo.  Completed   Zoster (Shingles) Vaccine  Completed   HPV Vaccine  Aged Out    Advanced directives: Advance directive discussed with you today.   Conditions/risks identified: none  Next appointment: Follow up in one year for your annual wellness visit    Preventive Care 65 Years and Older, Female Preventive care refers to lifestyle choices and visits with your health care provider that can promote health and wellness. What does preventive care include? A yearly physical exam. This is also called an annual well check. Dental exams once or twice a year. Routine eye exams. Ask your health care provider how often you should have your eyes checked. Personal lifestyle choices, including: Daily care of your teeth and gums. Regular physical activity. Eating a healthy diet. Avoiding tobacco and drug use. Limiting alcohol use. Practicing safe sex. Taking low-dose aspirin every day. Taking vitamin and mineral supplements as recommended by your health care provider. What happens during an annual well  check? The services and screenings done by your health care provider during your annual well check will depend on your age, overall health, lifestyle risk factors, and family history of disease. Counseling  Your health care provider may ask you questions about your: Alcohol use. Tobacco use. Drug use. Emotional well-being. Home and relationship well-being. Sexual activity. Eating habits. History of falls. Memory and ability to understand (cognition). Work and work Statistician. Reproductive health. Screening  You may have the following tests or measurements: Height, weight, and BMI. Blood pressure. Lipid and cholesterol levels. These may be checked every 5 years, or more frequently if you are over 43 years old. Skin check. Lung cancer screening. You may have this screening every year starting at age 21 if you have a 30-pack-year history of smoking and currently smoke or have quit within the past 15 years. Fecal occult blood test (FOBT) of the stool. You may have this test every year starting at age 9. Flexible sigmoidoscopy or colonoscopy. You may have a sigmoidoscopy every 5 years or a colonoscopy every 10 years starting at age 42. Hepatitis C blood test. Hepatitis B blood test. Sexually transmitted disease (STD) testing. Diabetes screening. This is done by checking your blood sugar (glucose) after you have not eaten for a while (fasting). You may have this done every 1-3 years. Bone density scan. This is done to screen for osteoporosis. You may have this done starting at age 39. Mammogram.  This may be done every 1-2 years. Talk to your health care provider about how often you should have regular mammograms. Talk with your health care provider about your test results, treatment options, and if necessary, the need for more tests. Vaccines  Your health care provider may recommend certain vaccines, such as: Influenza vaccine. This is recommended every year. Tetanus, diphtheria, and  acellular pertussis (Tdap, Td) vaccine. You may need a Td booster every 10 years. Zoster vaccine. You may need this after age 82. Pneumococcal 13-valent conjugate (PCV13) vaccine. One dose is recommended after age 3. Pneumococcal polysaccharide (PPSV23) vaccine. One dose is recommended after age 22. Talk to your health care provider about which screenings and vaccines you need and how often you need them. This information is not intended to replace advice given to you by your health care provider. Make sure you discuss any questions you have with your health care provider. Document Released: 10/23/2015 Document Revised: 06/15/2016 Document Reviewed: 07/28/2015 Elsevier Interactive Patient Education  2017 [place] Prevention in the Home Falls can cause injuries. They can happen to people of all ages. There are many things you can do to make your home safe and to help prevent falls. What can I do on the outside of my home? Regularly fix the edges of walkways and driveways and fix any cracks. Remove anything that might make you trip as you walk through a door, such as a raised step or threshold. Trim any bushes or trees on the path to your home. Use bright outdoor lighting. Clear any walking paths of anything that might make someone trip, such as rocks or tools. Regularly check to see if handrails are loose or broken. Make sure that both sides of any steps have handrails. Any raised decks and porches should have guardrails on the edges. Have any leaves, snow, or ice cleared regularly. Use sand or salt on walking paths during winter. Clean up any spills in your garage right away. This includes oil or grease spills. What can I do in the bathroom? Use night lights. Install grab bars by the toilet and in the tub and shower. Do not use towel bars as grab bars. Use non-skid mats or decals in the tub or shower. If you need to sit down in the shower, use a plastic, non-slip stool. Keep  the floor dry. Clean up any water that spills on the floor as soon as it happens. Remove soap buildup in the tub or shower regularly. Attach bath mats securely with double-sided non-slip rug tape. Do not have throw rugs and other things on the floor that can make you trip. What can I do in the bedroom? Use night lights. Make sure that you have a light by your bed that is easy to reach. Do not use any sheets or blankets that are too big for your bed. They should not hang down onto the floor. Have a firm chair that has side arms. You can use this for support while you get dressed. Do not have throw rugs and other things on the floor that can make you trip. What can I do in the kitchen? Clean up any spills right away. Avoid walking on wet floors. Keep items that you use a lot in easy-to-reach places. If you need to reach something above you, use a strong step stool that has a grab bar. Keep electrical cords out of the way. Do not use floor polish or wax that makes floors slippery. If you  must use wax, use non-skid floor wax. Do not have throw rugs and other things on the floor that can make you trip. What can I do with my stairs? Do not leave any items on the stairs. Make sure that there are handrails on both sides of the stairs and use them. Fix handrails that are broken or loose. Make sure that handrails are as long as the stairways. Check any carpeting to make sure that it is firmly attached to the stairs. Fix any carpet that is loose or worn. Avoid having throw rugs at the top or bottom of the stairs. If you do have throw rugs, attach them to the floor with carpet tape. Make sure that you have a light switch at the top of the stairs and the bottom of the stairs. If you do not have them, ask someone to add them for you. What else can I do to help prevent falls? Wear shoes that: Do not have high heels. Have rubber bottoms. Are comfortable and fit you well. Are closed at the toe. Do not  wear sandals. If you use a stepladder: Make sure that it is fully opened. Do not climb a closed stepladder. Make sure that both sides of the stepladder are locked into place. Ask someone to hold it for you, if possible. Clearly mark and make sure that you can see: Any grab bars or handrails. First and last steps. Where the edge of each step is. Use tools that help you move around (mobility aids) if they are needed. These include: Canes. Walkers. Scooters. Crutches. Turn on the lights when you go into a dark area. Replace any light bulbs as soon as they burn out. Set up your furniture so you have a clear path. Avoid moving your furniture around. If any of your floors are uneven, fix them. If there are any pets around you, be aware of where they are. Review your medicines with your doctor. Some medicines can make you feel dizzy. This can increase your chance of falling. Ask your doctor what other things that you can do to help prevent falls. This information is not intended to replace advice given to you by your health care provider. Make sure you discuss any questions you have with your health care provider. Document Released: 07/23/2009 Document Revised: 03/03/2016 Document Reviewed: 10/31/2014 Elsevier Interactive Patient Education  2017 [employer].

## 2022-12-28 NOTE — Progress Notes (Signed)
I connected with  Taylor Santiago on 12/28/22 by a audio enabled telemedicine application and verified that I am speaking with the correct person using two identifiers.  Patient Location: Home  Provider Location: Office/Clinic  I discussed the limitations of evaluation and management by telemedicine. The patient expressed understanding and agreed to proceed.  Subjective:   Taylor Santiago is a 67 y.o. female who presents for Medicare Annual (Subsequent) preventive examination.  Review of Systems     Cardiac Risk Factors include: advanced age (>6men, >66 women)     Objective:    Today's Vitals   12/28/22 1139 12/28/22 1140  Weight: 131 lb (59.4 kg)   Height: 5\' 4"  (1.626 m)   PainSc:  8    Body mass index is 22.49 kg/m.     12/28/2022   11:43 AM 12/23/2021    4:06 PM 09/13/2021    2:04 PM 11/03/2016    3:30 PM 09/07/2016    4:57 PM 07/07/2016    3:42 PM 02/15/2016    7:40 PM  Advanced Directives  Does Patient Have a Medical Advance Directive? No No No Yes No No No  Type of Advance Directive    Living will     Would patient like information on creating a medical advance directive?    No - Patient declined No - Patient declined      Current Medications (verified) No outpatient encounter medications on file as of 12/28/2022.   No facility-administered encounter medications on file as of 12/28/2022.    Allergies (verified) Hydrocodone, Penicillins, Percocet [oxycodone-acetaminophen], and Prednisone   History: Past Medical History:  Diagnosis Date   Abnormal LFTs (liver function tests)    Cirrhosis ([place])    Hepatitis C virus infection    Patient stated she has been treated   Hepatitis C, chronic ([place]) 2004   genotype 1a   Hyperlipidemia    Medical history non-contributory    Thrombocytopenia ([place])    Past Surgical History:  Procedure Laterality Date   ABDOMINAL HYSTERECTOMY     APPENDECTOMY     COLONOSCOPY  2013   IR TRANSCATHETER BX  07/09/2019   IR US  GUIDE VASC ACCESS RIGHT  07/09/2019   IR VENOGRAM HEPATIC W HEMODYNAMIC EVALUATION  07/09/2019   PARTIAL HYSTERECTOMY     Family History  Problem Relation Age of Onset   Hypertension Mother    Diabetes Mother    Ovarian cancer Mother    Heart Problems Brother    Throat cancer Brother    Heart disease Maternal Grandmother    Heart disease Maternal Grandfather    Healthy Daughter    Colon cancer Neg Hx    Breast cancer Neg Hx    Social History   Socioeconomic History   Marital status: Single    Spouse name: Not on file   Number of children: 1   Years of education: 12th   Highest education level: Not on file  Occupational History   Occupation: Counsellor: [employer]  Tobacco Use   Smoking status: Former    Packs/day: 0.50    Years: 26.00    Additional pack years: 0.00    Total pack years: 13.00    Types: Cigarettes    Start date: 49    Quit date: 2000    Years since quitting: 24.2   Smokeless tobacco: Never  Vaping Use   Vaping Use: Never used  Substance and Sexual Activity   Alcohol  use: Not Currently    Comment: Occasionally   Drug use: No   Sexual activity: Not Currently  Other Topics Concern   Not on file  Social History Narrative   Not on file   Social Determinants of Health   Financial Resource Strain: Low Risk  (12/28/2022)   Overall Financial Resource Strain (CARDIA)    Difficulty of Paying Living Expenses: Not hard at all  Food Insecurity: No Food Insecurity (12/28/2022)   Hunger Vital Sign    Worried About Running Out of Food in the Last Year: Never true    Ran Out of Food in the Last Year: Never true  Transportation Needs: No Transportation Needs (12/28/2022)   PRAPARE - Hydrologist (Medical): No    Lack of Transportation (Non-Medical): No  Physical Activity: Inactive (12/28/2022)   Exercise Vital Sign    Days of Exercise per Week: 0 days    Minutes of Exercise per Session: 0 min  Stress:  No Stress Concern Present (12/28/2022)   [place]    Feeling of Stress : Not at all  Social Connections: Not on file    Tobacco Counseling Counseling given: Not Answered   Clinical Intake:  Pre-visit preparation completed: Yes  Pain : 0-10 Pain Score: 8  Pain Type: Acute pain Pain Location: Neck Pain Descriptors / Indicators: Aching Pain Onset: Today Pain Frequency: Constant     Nutritional Status: BMI of 19-24  Normal Nutritional Risks: None Diabetes: No  How often do you need to have someone help you when you read instructions, pamphlets, or other written materials from your doctor or pharmacy?: 1 - Never  Diabetic? no  Interpreter Needed?: No  Information entered by :: NAllen LPN   Activities of Daily Living    12/28/2022   11:45 AM  In your present state of health, do you have any difficulty performing the following activities:  Hearing? 0  Vision? 1  Comment blurry sometimes  Difficulty concentrating or making decisions? 0  Walking or climbing stairs? 1  Comment if there are a lot of steps  Dressing or bathing? 0  Doing errands, shopping? 0  Preparing Food and eating ? N  Using the Toilet? N  In the past six months, have you accidently leaked urine? N  Do you have problems with loss of bowel control? N  Managing your Medications? N  Managing your Finances? N  Housekeeping or managing your Housekeeping? N    Patient Care Team: Glendale Chard, MD as PCP - General (Internal Medicine)  Indicate any recent Medical Services you may have received from other than [facility] providers in the past year (date may be approximate).     Assessment:   This is a routine wellness examination for [name].  Hearing/Vision screen Vision Screening - Comments:: Regular eye exams, Dr. Gershon Crane  Dietary issues and exercise activities discussed: Current Exercise Habits: The patient does not participate in  regular exercise at present   Goals Addressed             This Visit's Progress    Patient Stated       12/28/2022, no goals       Depression Screen    12/28/2022   11:45 AM 04/18/2022    8:49 AM 12/23/2021    4:10 PM 04/05/2021    3:45 PM 04/02/2019   10:13 AM 10/18/2018    2:59 PM 09/27/2018    3:53 PM  PHQ 2/9 Scores  PHQ - 2 Score 0 2 0 0 0 0 0  PHQ- 9 Score  6         Fall Risk    12/28/2022   11:45 AM 04/18/2022    8:49 AM 12/23/2021    4:09 PM 04/05/2021    3:46 PM 04/02/2019   10:13 AM  Fall Risk   Falls in the past year? 0 0 0 0 0  Number falls in past yr: 0 0  0   Injury with Fall? 0 0  0   Risk for fall due to : No Fall Risks No Fall Risks No Fall Risks    Follow up Falls prevention discussed;Education provided;Falls evaluation completed Falls evaluation completed Falls evaluation completed;Education provided;Falls prevention discussed      FALL RISK PREVENTION PERTAINING TO THE HOME:  Any stairs in or around the home? No  If so, are there any without handrails? N/a Home free of loose throw rugs in walkways, pet beds, electrical cords, etc? Yes  Adequate lighting in your home to reduce risk of falls? Yes   ASSISTIVE DEVICES UTILIZED TO PREVENT FALLS:  Life alert? No  Use of a cane, walker or w/c? No  Grab bars in the bathroom? No  Shower chair or bench in shower? No  Elevated toilet seat or a handicapped toilet? No   TIMED UP AND GO:  Was the test performed? No .      Cognitive Function:        12/28/2022   11:48 AM 12/23/2021    4:13 PM  6CIT Screen  What Year? 0 points 0 points  What month? 0 points 0 points  What time? 0 points 0 points  Count back from 20 0 points 0 points  Months in reverse 0 points 0 points  Repeat phrase 0 points 2 points  Total Score 0 points 2 points    Immunizations Immunization History  Administered Date(s) Administered   Fluad Quad(high Dose 65+) 08/11/2022   Influenza,inj,Quad PF,6+ Mos 07/31/2016,  08/03/2017, 07/21/2021   Influenza,inj,Quad PF,6-35 Mos 06/27/2019, 07/13/2020   Influenza,inj,quad, With Preservative 07/31/2016, 08/03/2017, 08/05/2018   Influenza-Unspecified 08/05/2018   PFIZER(Purple Top)SARS-COV-2 Vaccination 12/21/2019, 01/11/2020   PNEUMOCOCCAL CONJUGATE-20 04/18/2022   Tdap 07/13/2020   Zoster Recombinat (Shingrix) 05/10/2022, 07/21/2022    TDAP status: Up to date  Flu Vaccine status: Up to date  Pneumococcal vaccine status: Up to date  Covid-19 vaccine status: Completed vaccines  Qualifies for Shingles Vaccine? Yes   Zostavax completed Yes   Shingrix Completed?: Yes  Screening Tests Health Maintenance  Topic Date Due   COVID-19 Vaccine (4 - 2023-24 season) 06/10/2022   Medicare Annual Wellness (AWV)  12/28/2023   MAMMOGRAM  08/01/2024   COLONOSCOPY (Pts 45-57yrs Insurance coverage will need to be confirmed)  08/23/2028   DTaP/Tdap/Td (2 - Td or Tdap) 07/13/2030   Pneumonia Vaccine 71+ Years old  Completed   INFLUENZA VACCINE  Completed   DEXA SCAN  Completed   Hepatitis C Screening  Completed   Zoster Vaccines- Shingrix  Completed   HPV VACCINES  Aged Out    Health Maintenance  Health Maintenance Due  Topic Date Due   COVID-19 Vaccine (4 - 2023-24 season) 06/10/2022    Colorectal cancer screening: Type of screening: Colonoscopy. Completed 08/23/2021. Repeat every 7 years  Mammogram status: Completed 08/01/2022. Repeat every year  Bone Density status: Completed 11/04/2021.   Lung Cancer Screening: (Low Dose CT Chest recommended if Age 84-80  years, 30 pack-year currently smoking OR have quit w/in 15years.) does not qualify.   Lung Cancer Screening Referral: no  Additional Screening:  Hepatitis C Screening: does qualify; Completed 11/25/2016  Vision Screening: Recommended annual ophthalmology exams for early detection of glaucoma and other disorders of the eye. Is the patient up to date with their annual eye exam?  Yes  Who is the  provider or what is the name of the office in which the patient attends annual eye exams? Dr. Gershon Crane If pt is not established with a provider, would they like to be referred to a provider to establish care? No .   Dental Screening: Recommended annual dental exams for proper oral hygiene  Community Resource Referral / Chronic Care Management: CRR required this visit?  No   CCM required this visit?  No      Plan:     I have personally reviewed and noted the following in the patient's chart:   Medical and social history Use of alcohol, tobacco or illicit drugs  Current medications and supplements including opioid prescriptions. Patient is not currently taking opioid prescriptions. Functional ability and status Nutritional status Physical activity Advanced directives List of other physicians Hospitalizations, surgeries, and ER visits in previous 12 months Vitals Screenings to include cognitive, depression, and falls Referrals and appointments  In addition, I have reviewed and discussed with patient certain preventive protocols, quality metrics, and best practice recommendations. A written personalized care plan for preventive services as well as general preventive health recommendations were provided to patient.     Kellie Simmering, LPN   [id number]   Nurse Notes: none  Due to this being a virtual visit, the after visit summary with patients personalized plan was offered to patient via mail or my-chart.  to pick up at office at next visit

## 2022-12-29 ENCOUNTER — Other Ambulatory Visit: Payer: Medicare Other

## 2023-01-27 ENCOUNTER — Ambulatory Visit
Admission: RE | Admit: 2023-01-27 | Discharge: 2023-01-27 | Disposition: A | Discharge status: Home / Self Care | Payer: 59 | Source: Ambulatory Visit | Attending: Nurse Practitioner | Admitting: Nurse Practitioner

## 2023-01-27 DIAGNOSIS — K7402 Hepatic fibrosis, advanced fibrosis: Secondary | ICD-10-CM | POA: Diagnosis not present

## 2023-01-27 DIAGNOSIS — K7460 Unspecified cirrhosis of liver: Secondary | ICD-10-CM | POA: Diagnosis not present

## 2023-01-27 DIAGNOSIS — K760 Fatty (change of) liver, not elsewhere classified: Secondary | ICD-10-CM

## 2023-02-07 DIAGNOSIS — H25813 Combined forms of age-related cataract, bilateral: Secondary | ICD-10-CM | POA: Diagnosis not present

## 2023-02-20 ENCOUNTER — Other Ambulatory Visit: Payer: Self-pay

## 2023-02-20 ENCOUNTER — Ambulatory Visit (HOSPITAL_COMMUNITY)
Admission: EM | Admit: 2023-02-20 | Discharge: 2023-02-20 | Disposition: A | Discharge status: Home / Self Care | Payer: 59 | Attending: Internal Medicine | Admitting: Internal Medicine

## 2023-02-20 ENCOUNTER — Encounter (HOSPITAL_COMMUNITY): Payer: Self-pay | Admitting: *Deleted

## 2023-02-20 DIAGNOSIS — Z87891 Personal history of nicotine dependence: Secondary | ICD-10-CM | POA: Insufficient documentation

## 2023-02-20 DIAGNOSIS — J029 Acute pharyngitis, unspecified: Secondary | ICD-10-CM | POA: Diagnosis not present

## 2023-02-20 DIAGNOSIS — Z1152 Encounter for screening for COVID-19: Secondary | ICD-10-CM | POA: Insufficient documentation

## 2023-02-20 DIAGNOSIS — J069 Acute upper respiratory infection, unspecified: Secondary | ICD-10-CM | POA: Diagnosis not present

## 2023-02-20 DIAGNOSIS — R059 Cough, unspecified: Secondary | ICD-10-CM | POA: Diagnosis not present

## 2023-02-20 DIAGNOSIS — B9789 Other viral agents as the cause of diseases classified elsewhere: Secondary | ICD-10-CM | POA: Insufficient documentation

## 2023-02-20 LAB — POCT RAPID STREP A (OFFICE): Rapid Strep A Screen: NEGATIVE

## 2023-02-20 MED ORDER — BENZONATATE 100 MG PO CAPS: 100.0000 mg | ORAL_CAPSULE | Freq: Three times a day (TID) | ORAL | 0 refills | Status: DC | PRN

## 2023-02-20 MED ORDER — CHLORASEPTIC 1.4 % MT LIQD: 1.0000 | OROMUCOSAL | 0 refills | Status: DC | PRN

## 2023-02-20 NOTE — ED Triage Notes (Signed)
Pt reports since Thursday she has had body aches,chills ,cough and sore throat.

## 2023-02-20 NOTE — ED Provider Notes (Signed)
[facility]    CSN: [id number] Arrival date & time: 02/20/23  1510      History   Chief Complaint Chief Complaint  Patient presents with   Sore Throat   Cough   Generalized Body Aches    HPI Taylor Santiago is a 67 y.o. female.   Patient presents with 5-day history of bodyaches, chills, cough, sore throat, nasal congestion.  Patient denies any known fevers or sick contacts.  She has taken several over-the-counter cold and flu medication with minimal improvement of symptoms.  Denies chest pain, shortness of breath, gastrointestinal symptoms.  Patient denies history of asthma or COPD.  Patient reports that she used to smoke cigarettes but does not currently smoke any cigarettes.   Sore Throat  Cough   Past Medical History:  Diagnosis Date   Abnormal LFTs (liver function tests)    Cirrhosis (HCC)    Hepatitis C virus infection    Patient stated she has been treated   Hepatitis C, chronic (HCC) 2004   genotype 1a   Hyperlipidemia    Medical history non-contributory    Thrombocytopenia (HCC)     Patient Active Problem List   Diagnosis Date Noted   Hepatitis C carrier (HCC) 04/18/2022   Osteoarthritis of hands, bilateral 06/09/2021   Rheumatoid factor positive 05/25/2021   Thrombocytopenia (HCC) 04/05/2021   Other chest pain 04/05/2021   Arthralgia 04/05/2021   History of tobacco use 04/05/2021   Smoker 02/01/2021   Hepatic steatosis 02/01/2021   Age-related osteoporosis without current pathological fracture 10/02/2018   Achilles tendinitis of left lower extremity 02/24/2016   Left hand weakness 05/15/2014   Numbness and tingling in left arm 05/15/2014   Hepatitis C virus infection cured after antiviral drug therapy 05/15/2014    Past Surgical History:  Procedure Laterality Date   ABDOMINAL HYSTERECTOMY     APPENDECTOMY     COLONOSCOPY  2013   IR TRANSCATHETER BX  07/09/2019   IR US GUIDE VASC ACCESS RIGHT  07/09/2019   IR VENOGRAM HEPATIC W  HEMODYNAMIC EVALUATION  07/09/2019   PARTIAL HYSTERECTOMY      OB History     Gravida  1   Para  1   Term      Preterm      AB      Living  1      SAB      IAB      Ectopic      Multiple      Live Births               Home Medications    Prior to Admission medications   Medication Sig Start Date End Date Taking? Authorizing Provider  benzonatate (TESSALON) 100 MG capsule Take 1 capsule (100 mg total) by mouth every 8 (eight) hours as needed for cough. 02/20/23  Yes Duvall Comes, Rolly Salter E, FNP  phenol (CHLORASEPTIC) 1.4 % LIQD Use as directed 1 spray in the mouth or throat as needed for throat irritation / pain. 02/20/23  Yes Lindzy Rupert, Acie Fredrickson, FNP    Family History Family History  Problem Relation Age of Onset   Hypertension Mother    Diabetes Mother    Ovarian cancer Mother    Heart Problems Brother    Throat cancer Brother    Heart disease Maternal Grandmother    Heart disease Maternal Grandfather    Healthy Daughter    Colon cancer Neg Hx    Breast cancer Neg Hx  Social History Social History   Tobacco Use   Smoking status: Former    Packs/day: 0.50    Years: 26.00    Additional pack years: 0.00    Total pack years: 13.00    Types: Cigarettes    Start date: 9    Quit date: 2000    Years since quitting: 24.3   Smokeless tobacco: Never  Vaping Use   Vaping Use: Never used  Substance Use Topics   Alcohol use: Not Currently    Comment: Occasionally   Drug use: No     Allergies   Hydrocodone, Penicillins, Percocet [oxycodone-acetaminophen], and Prednisone   Review of Systems Review of Systems Per HPI  Physical Exam Triage Vital Signs ED Triage Vitals  Enc Vitals Group     BP 02/20/23 1718 136/82     Pulse Rate 02/20/23 1718 66     Resp 02/20/23 1718 20     Temp 02/20/23 1718 98.4 F (36.9 C)     Temp src --      SpO2 02/20/23 1718 94 %     Weight --      Height --      Head Circumference --      Peak Flow --      Pain  Score 02/20/23 1716 7     Pain Loc --      Pain Edu? --      Excl. in GC? --    No data found.  Updated Vital Signs BP 136/82   Pulse 66   Temp 98.4 F (36.9 C)   Resp 20   SpO2 100%   Visual Acuity Right Eye Distance:   Left Eye Distance:   Bilateral Distance:    Right Eye Near:   Left Eye Near:    Bilateral Near:     Physical Exam Constitutional:      General: She is not in acute distress.    Appearance: Normal appearance. She is not toxic-appearing or diaphoretic.  HENT:     Head: Normocephalic and atraumatic.     Right Ear: Tympanic membrane and ear canal normal.     Left Ear: Tympanic membrane and ear canal normal.     Nose: Congestion present.     Mouth/Throat:     Mouth: Mucous membranes are moist.     Pharynx: Posterior oropharyngeal erythema present.  Eyes:     Extraocular Movements: Extraocular movements intact.     Conjunctiva/sclera: Conjunctivae normal.     Pupils: Pupils are equal, round, and reactive to light.  Cardiovascular:     Rate and Rhythm: Normal rate and regular rhythm.     Pulses: Normal pulses.     Heart sounds: Normal heart sounds.  Pulmonary:     Effort: Pulmonary effort is normal. No respiratory distress.     Breath sounds: Normal breath sounds. No wheezing.  Abdominal:     General: Abdomen is flat. Bowel sounds are normal.     Palpations: Abdomen is soft.  Musculoskeletal:        General: Normal range of motion.     Cervical back: Normal range of motion.  Skin:    General: Skin is warm and dry.  Neurological:     General: No focal deficit present.     Mental Status: She is alert and oriented to person, place, and time. Mental status is at baseline.  Psychiatric:        Mood and Affect: Mood normal.  Behavior: Behavior normal.      UC Treatments / Results  Labs (all labs ordered are listed, but only abnormal results are displayed) Labs Reviewed  CULTURE, GROUP A STREP ([facility])  SARS CORONAVIRUS 2 (TAT 6-24 HRS)   POCT RAPID STREP A (OFFICE)    EKG   Radiology No results found.  Procedures Procedures (including critical care time)  Medications Ordered in UC Medications - No data to display  Initial Impression / Assessment and Plan / UC Course  I have reviewed the triage vital signs and the nursing notes.  Pertinent labs & imaging results that were available during my care of the patient were reviewed by me and considered in my medical decision making (see chart for details).     Patient presents with symptoms likely from a viral upper respiratory infection. Do not suspect underlying cardiopulmonary process. Symptoms seem unlikely related to ACS, CHF or COPD exacerbations, pneumonia, pneumothorax. Patient is nontoxic appearing and not in need of emergent medical intervention.  Rapid strep is negative.  Throat culture and COVID test pending.  Recommended symptom control with medications and supportive care.  Patient was sent prescriptions.  Advised adequate fluid hydration and rest.  Return if symptoms fail to improve in 1-2 weeks or you develop shortness of breath, chest pain, severe headache. Patient states understanding and is agreeable.  Discharged with PCP followup.  Final Clinical Impressions(s) / UC Diagnoses   Final diagnoses:  Viral URI with cough  Sore throat     Discharge Instructions      Rapid strep is negative.  Throat culture and COVID test pending.  As we discussed, it appears that your symptoms are viral in nature and should run their course.  Ensure adequate fluid hydration and rest.  I have prescribed 2 medications to help alleviate symptoms.  Follow-up if any symptoms persist or worsen.     ED Prescriptions     Medication Sig Dispense Auth. Provider   benzonatate (TESSALON) 100 MG capsule Take 1 capsule (100 mg total) by mouth every 8 (eight) hours as needed for cough. 21 capsule [place], [place], [place]   phenol (CHLORASEPTIC) 1.4 % LIQD Use as directed 1 spray  in the mouth or throat as needed for throat irritation / pain. 118 mL Gustavus Bryant, [place]      PDMP not reviewed this encounter.   Gustavus [place], [place] 02/20/23 [phone]

## 2023-02-20 NOTE — Discharge Instructions (Addendum)
Rapid strep is negative.  Throat culture and COVID test pending.  As we discussed, it appears that your symptoms are viral in nature and should run their course.  Ensure adequate fluid hydration and rest.  I have prescribed 2 medications to help alleviate symptoms.  Follow-up if any symptoms persist or worsen.

## 2023-02-21 LAB — SARS CORONAVIRUS 2 (TAT 6-24 HRS): SARS Coronavirus 2: NEGATIVE

## 2023-02-21 LAB — CULTURE, GROUP A STREP (THRC)

## 2023-02-23 LAB — CULTURE, GROUP A STREP (THRC)

## 2023-05-02 ENCOUNTER — Encounter: Payer: Self-pay | Admitting: Nurse Practitioner

## 2023-05-02 ENCOUNTER — Other Ambulatory Visit: Payer: Self-pay | Admitting: Nurse Practitioner

## 2023-05-02 ENCOUNTER — Ambulatory Visit (INDEPENDENT_AMBULATORY_CARE_PROVIDER_SITE_OTHER): Payer: 59 | Admitting: Nurse Practitioner

## 2023-05-02 VITALS — BP 110/80 | HR 60 | Temp 97.7°F | Ht 65.6 in | Wt 132.6 lb

## 2023-05-02 DIAGNOSIS — Z2821 Immunization not carried out because of patient refusal: Secondary | ICD-10-CM

## 2023-05-02 DIAGNOSIS — Z79899 Other long term (current) drug therapy: Secondary | ICD-10-CM | POA: Diagnosis not present

## 2023-05-02 DIAGNOSIS — Z0000 Encounter for general adult medical examination without abnormal findings: Secondary | ICD-10-CM | POA: Insufficient documentation

## 2023-05-02 DIAGNOSIS — L299 Pruritus, unspecified: Secondary | ICD-10-CM | POA: Diagnosis not present

## 2023-05-02 DIAGNOSIS — R202 Paresthesia of skin: Secondary | ICD-10-CM | POA: Diagnosis not present

## 2023-05-02 DIAGNOSIS — K7400 Hepatic fibrosis, unspecified: Secondary | ICD-10-CM

## 2023-05-02 DIAGNOSIS — Z0001 Encounter for general adult medical examination with abnormal findings: Secondary | ICD-10-CM

## 2023-05-02 DIAGNOSIS — E559 Vitamin D deficiency, unspecified: Secondary | ICD-10-CM | POA: Diagnosis not present

## 2023-05-02 NOTE — Assessment & Plan Note (Signed)
Will check for metabolic causes. 

## 2023-05-02 NOTE — Assessment & Plan Note (Signed)

## 2023-05-02 NOTE — Assessment & Plan Note (Signed)
Continue f/u with Liver specialiast/NP

## 2023-05-02 NOTE — Assessment & Plan Note (Signed)
Will check for food allergies and advised her to take an over the counter antihistamine

## 2023-05-02 NOTE — Assessment & Plan Note (Signed)

## 2023-05-02 NOTE — Progress Notes (Signed)
I,[name], CMA,acting as a Neurosurgeon for [employer], FNP.,have documented all relevant documentation on the behalf of Arnette Felts, FNP,as directed by  Arnette Felts, FNP while in the presence of Arnette Felts, FNP.  Subjective:    Patient ID: Taylor Santiago , female    DOB: 1955-11-14 , 67 y.o.   MRN: [id number]  Chief Complaint  Patient presents with   Annual Exam    HPI  Patient presents to for HM. Patient does not have any questions or concerns at this time.    Dizziness This is a chronic problem. The current episode started 1 to 4 weeks ago. The problem occurs intermittently. The problem has been unchanged. Pertinent negatives include no anorexia, congestion, fatigue or visual change. Associated symptoms comments: Feels like a film comes over her eye. She has tried nothing for the symptoms.     Past Medical History:  Diagnosis Date   Abnormal LFTs (liver function tests)    Cirrhosis (HCC)    Hepatitis C virus infection    Patient stated she has been treated   Hepatitis C, chronic (HCC) 2004   genotype 1a   Hyperlipidemia    Medical history non-contributory    Thrombocytopenia (HCC)      Family History  Problem Relation Age of Onset   Hypertension Mother    Diabetes Mother    Ovarian cancer Mother    Heart Problems Brother    Throat cancer Brother    Heart disease Maternal Grandmother    Heart disease Maternal Grandfather    Healthy Daughter    Colon cancer Neg Hx    Breast cancer Neg Hx     No current outpatient medications on file.   Allergies  Allergen Reactions   Hydrocodone Itching   Penicillins Hives    Sweats Has patient had a PCN reaction causing immediate rash, facial/tongue/throat swelling, SOB or lightheadedness with hypotension: No Has patient had a PCN reaction causing severe rash involving mucus membranes or skin necrosis:No Has patient had a PCN reaction that required hospitalization No Has patient had a PCN reaction occurring within  the last 10 years: no   If all of the above answers are "NO", then may proceed with Cephalosporin use.    Percocet [Oxycodone-Acetaminophen]    Prednisone Rash      The patient states she uses status post hysterectomy for birth control. No LMP recorded. Patient has had a hysterectomy.. Negative for Dysmenorrhea and Negative for Menorrhagia. Negative for: breast discharge, breast lump(s), breast pain and breast self exam. Associated symptoms include abnormal vaginal bleeding. Pertinent negatives include abnormal bleeding (hematology), anxiety, decreased libido, depression, difficulty falling sleep, dyspareunia, history of infertility, nocturia, sexual dysfunction, sleep disturbances, urinary incontinence, urinary urgency, vaginal discharge and vaginal itching. Diet regular.  The patient states her exercise level is moderate - she is doing a lot of walking, she is working at a [employer].   The patient's tobacco use is:  Social History   Tobacco Use  Smoking Status Former   Current packs/day: 0.00   Average packs/day: 0.5 packs/day for 26.0 years (13.0 ttl pk-yrs)   Types: Cigarettes   Start date: 61   Quit date: 2000   Years since quitting: 24.5  Smokeless Tobacco Never   She has been exposed to passive smoke. The patient's alcohol use is:  Social History   Substance and Sexual Activity  Alcohol Use Not Currently   Comment: Occasionally     Review of Systems  Constitutional: Negative.  Negative for fatigue.  HENT: Negative.  Negative for congestion.   Eyes: Negative.   Respiratory: Negative.    Cardiovascular: Negative.   Gastrointestinal: Negative.  Negative for anorexia.  Endocrine: Negative.   Genitourinary: Negative.   Musculoskeletal: Negative.   Skin: Negative.        Pruritus all over  Allergic/Immunologic: Negative.   Neurological: Negative.   Hematological: Negative.   Psychiatric/Behavioral: Negative.       Today's Vitals   05/02/23 0824  BP: 110/80   Pulse: 60  Temp: 97.7 F (36.5 C)  Weight: 132 lb 9.6 oz (60.1 kg)  Height: 5' 5.6" (1.666 m)  PainSc: 0-No pain   Body mass index is 21.66 kg/m.  Wt Readings from Last 3 Encounters:  05/02/23 132 lb 9.6 oz (60.1 kg)  12/28/22 131 lb (59.4 kg)  08/11/22 136 lb (61.7 kg)     Objective:  Physical Exam Vitals reviewed.  Constitutional:      General: She is not in acute distress.    Appearance: Normal appearance. She is well-developed.  HENT:     Head: Normocephalic and atraumatic.     Right Ear: Hearing, tympanic membrane, ear canal and external ear normal. There is no impacted cerumen.     Left Ear: Hearing, tympanic membrane, ear canal and external ear normal. There is no impacted cerumen.     Nose: Nose normal.     Mouth/Throat:     Mouth: Mucous membranes are moist.  Eyes:     General: Lids are normal.     Extraocular Movements: Extraocular movements intact.     Conjunctiva/sclera: Conjunctivae normal.     Pupils: Pupils are equal, round, and reactive to light.     Funduscopic exam:    Right eye: No papilledema.        Left eye: No papilledema.  Neck:     Thyroid: No thyroid mass.     Vascular: No carotid bruit.  Cardiovascular:     Rate and Rhythm: Normal rate and regular rhythm.     Pulses: Normal pulses.     Heart sounds: Normal heart sounds. No murmur heard. Pulmonary:     Effort: Pulmonary effort is normal. No respiratory distress.     Breath sounds: Normal breath sounds. No wheezing.  Chest:     Chest wall: No mass.  Breasts:    Tanner Score is 5.     Right: Normal. No mass or tenderness.     Left: Normal. No mass or tenderness.  Abdominal:     General: Abdomen is flat. Bowel sounds are normal. There is no distension.     Palpations: Abdomen is soft.     Tenderness: There is no abdominal tenderness.  Musculoskeletal:        General: No swelling. Normal range of motion.     Cervical back: Full passive range of motion without pain, normal range of  motion and neck supple.     Right lower leg: No edema.     Left lower leg: No edema.  Lymphadenopathy:     Upper Body:     Right upper body: No supraclavicular, axillary or pectoral adenopathy.     Left upper body: No supraclavicular, axillary or pectoral adenopathy.  Skin:    General: Skin is warm and dry.     Capillary Refill: Capillary refill takes less than 2 seconds.  Neurological:     General: No focal deficit present.     Mental Status: She is alert and  oriented to person, place, and time.     Cranial Nerves: No cranial nerve deficit.     Sensory: No sensory deficit.  Psychiatric:        Mood and Affect: Mood normal.        Behavior: Behavior normal.        Thought Content: Thought content normal.        Judgment: Judgment normal.         Assessment And Plan:     Annual physical exam Assessment & Plan: Behavior modifications discussed and diet history reviewed.   Pt will continue to exercise regularly and modify diet with low GI, plant based foods and decrease intake of processed foods.  Recommend intake of daily multivitamin, Vitamin D, and calcium.  Recommend mammogram and colonoscopy for preventive screenings, as well as recommend immunizations that include influenza, TDAP, and Shingles    Hepatic fibrosis Assessment & Plan: Continue f/u with Liver specialiast/NP  Orders: -     Lipid panel -     CMP14+EGFR  Pruritus Assessment & Plan: Will check for food allergies and advised her to take an over the counter antihistamine  Orders: -     CMP14+EGFR  Tingling in extremities Assessment & Plan: Will check for metabolic causes  Orders: -     TSH -     VITAMIN D 25 Hydroxy (Vit-D Deficiency, Fractures) -     Vitamin B12  COVID-19 vaccination declined Assessment & Plan: Declines covid 19 vaccine. Discussed risk of covid 62 and if she changes her mind about the vaccine to call the office. Education has been provided regarding the importance of this  vaccine but patient still declined. Advised may receive this vaccine at local pharmacy or Health Dept.or vaccine clinic. Aware to provide a copy of the vaccination record if obtained from local pharmacy or Health Dept.  Encouraged to take multivitamin, vitamin d, vitamin c and zinc to increase immune system. Aware can call office if would like to have vaccine here at office. Verbalized acceptance and understanding.    Other long term (current) drug therapy -     CBC    Return for 1 year physical. Patient was given opportunity to ask questions. Patient verbalized understanding of the plan and was able to repeat key elements of the plan. All questions were answered to their satisfaction.   Arnette Felts, FNP  I, Arnette Felts, FNP, have reviewed all documentation for this visit. The documentation on 05/02/23 for the exam, diagnosis, procedures, and orders are all accurate and complete.

## 2023-05-03 LAB — CBC
Hematocrit: 36.3 % (ref 34.0–46.6)
Hemoglobin: 11.9 g/dL (ref 11.1–15.9)
MCH: 30 pg (ref 26.6–33.0)
MCHC: 32.8 g/dL (ref 31.5–35.7)
MCV: 91 fL (ref 79–97)
Platelets: 138 10*3/uL — ABNORMAL LOW (ref 150–450)
RBC: 3.97 x10E6/uL (ref 3.77–5.28)
RDW: 11.8 % (ref 11.7–15.4)
WBC: 4.9 10*3/uL (ref 3.4–10.8)

## 2023-05-03 LAB — LIPID PANEL
Chol/HDL Ratio: 2.9 ratio (ref 0.0–4.4)
Cholesterol, Total: 167 mg/dL (ref 100–199)
HDL: 57 mg/dL (ref >39)
LDL Chol Calc (NIH): 99 mg/dL (ref 0–99)
Triglycerides: 57 mg/dL (ref 0–149)
VLDL Cholesterol Cal: 11 mg/dL (ref 5–40)

## 2023-05-03 LAB — CMP14+EGFR
ALT: 22 IU/L (ref 0–32)
AST: 34 IU/L (ref 0–40)
Albumin: 4.6 g/dL (ref 3.9–4.9)
Alkaline Phosphatase: 88 IU/L (ref 44–121)
BUN/Creatinine Ratio: 16 (ref 12–28)
BUN: 13 mg/dL (ref 8–27)
Bilirubin Total: 0.6 mg/dL (ref 0.0–1.2)
CO2: 23 mmol/L (ref 20–29)
Calcium: 9.5 mg/dL (ref 8.7–10.3)
Chloride: 101 mmol/L (ref 96–106)
Creatinine, Ser: 0.79 mg/dL (ref 0.57–1.00)
Globulin, Total: 3.3 g/dL (ref 1.5–4.5)
Glucose: 106 mg/dL — ABNORMAL HIGH (ref 70–99)
Potassium: 5.1 mmol/L (ref 3.5–5.2)
Sodium: 139 mmol/L (ref 134–144)
Total Protein: 7.9 g/dL (ref 6.0–8.5)
eGFR: 82 mL/min/{1.73_m2} (ref >59)

## 2023-05-03 LAB — VITAMIN D 25 HYDROXY (VIT D DEFICIENCY, FRACTURES): Vit D, 25-Hydroxy: 16.2 ng/mL — ABNORMAL LOW (ref 30.0–100.0)

## 2023-05-03 LAB — TSH: TSH: 1.25 u[IU]/mL (ref 0.450–4.500)

## 2023-05-03 LAB — VITAMIN B12: Vitamin B-12: 430 pg/mL (ref 232–1245)

## 2023-05-04 LAB — ALLERGENS (94) FOODS
Allergen Apple, IgE: <0.1 kU/L
Allergen Banana IgE: <0.1 kU/L
Allergen Black Pepper IgE: 0.15 kU/L — AB
Allergen Blueberry IgE: <0.1 kU/L
Allergen Broccoli: <0.1 kU/L
Allergen Cabbage IgE: <0.1 kU/L
Allergen Cauliflower IgE: <0.1 kU/L
Allergen Celery IgE: <0.1 kU/L
Allergen Cinnamon IgE: <0.1 kU/L
Allergen Coconut IgE: <0.1 kU/L
Allergen Corn, IgE: 0.11 kU/L — AB
Allergen Cucumber IgE: <0.1 kU/L
Allergen Garlic IgE: <0.1 kU/L
Allergen Ginger IgE: <0.1 kU/L
Allergen Gluten IgE: <0.1 kU/L
Allergen Grape IgE: <0.1 kU/L
Allergen Grapefruit IgE: <0.1 kU/L
Allergen Green Bean IgE: <0.1 kU/L
Allergen Green Pea IgE: <0.1 kU/L
Allergen Lamb IgE: <0.1 kU/L
Allergen Lettuce IgE: <0.1 kU/L
Allergen Lime IgE: <0.1 kU/L
Allergen Melon IgE: <0.1 kU/L
Allergen Oat IgE: 0.4 kU/L — AB
Allergen Onion IgE: <0.1 kU/L
Allergen Pear IgE: <0.1 kU/L
Allergen Potato, White IgE: <0.1 kU/L
Allergen Rice IgE: <0.1 kU/L
Allergen Salmon IgE: <0.1 kU/L
Allergen Strawberry IgE: <0.1 kU/L
Allergen Sweet Potato IgE: <0.1 kU/L
Allergen Tomato, IgE: <0.1 kU/L
Allergen Turkey IgE: <0.1 kU/L
Allergen Watermelon IgE: <0.1 kU/L
Allergen, Peach f95: <0.1 kU/L
Basil: <0.1 kU/L
Beef IgE: <0.1 kU/L
C074-IgE Gelatin: <0.1 kU/L
Chicken IgE: <0.1 kU/L
Clam IgE: <0.1 kU/L
Coffee: <0.1 kU/L
Cranberry IgE: <0.1 kU/L
Egg White IgE: <0.1 kU/L
F020-IgE Almond: 0.12 kU/L — AB
F023-IgE Crab: 4.55 kU/L — AB
F045-IgE Yeast: <0.1 kU/L
F076-IgE Alpha Lactalbumin: <0.1 kU/L
F077-IgE Beta Lactoglobulin: <0.1 kU/L
F078-IgE Casein: <0.1 kU/L
F080-IgE Lobster: 3.6 kU/L — AB
F081-IgE Cheese, Cheddar Type: <0.1 kU/L
F089-IgE Mustard: <0.1 kU/L
F096-IgE Avocado: <0.1 kU/L
F202-IgE Cashew Nut: <0.1 kU/L
F214-IgE Spinach: <0.1 kU/L
F222-IgE Tea: <0.1 kU/L
F242-IgE Bing Cherry: <0.1 kU/L
F262-IgE Eggplant: <0.1 kU/L
F265-IgE Cumin: <0.1 kU/L
F278-IgE Bayleaf (Laurel): <0.1 kU/L
F279-IgE Chili Pepper: 0.26 kU/L — AB
F283-IgE Oregano: <0.1 kU/L
F300-IgE Goat's Milk: 0.1 kU/L — AB
F343-IgE Raspberry: <0.1 kU/L
Lemon: <0.1 kU/L
Lima Bean IgE: <0.1 kU/L
Malt: 0.11 kU/L — AB
Orange: <0.1 kU/L
Peanut IgE: <0.1 kU/L
Pineapple IgE: <0.1 kU/L
Pork IgE: <0.1 kU/L
Pumpkin IgE: <0.1 kU/L
Red Beet: <0.1 kU/L
Rye IgE: <0.1 kU/L
Scallop IgE: <0.1 kU/L
Sesame Seed IgE: <0.1 kU/L
Shrimp IgE: 5.02 kU/L — AB
Vanilla: <0.1 kU/L
Walnut IgE: <0.1 kU/L
Wheat IgE: <0.1 kU/L
Whey: <0.1 kU/L
White Bean IgE: <0.1 kU/L

## 2023-05-30 ENCOUNTER — Other Ambulatory Visit: Payer: Self-pay | Admitting: Nurse Practitioner

## 2023-05-30 DIAGNOSIS — K7402 Hepatic fibrosis, advanced fibrosis: Secondary | ICD-10-CM | POA: Diagnosis not present

## 2023-05-30 DIAGNOSIS — K760 Fatty (change of) liver, not elsewhere classified: Secondary | ICD-10-CM | POA: Diagnosis not present

## 2023-05-30 DIAGNOSIS — Z8619 Personal history of other infectious and parasitic diseases: Secondary | ICD-10-CM | POA: Diagnosis not present

## 2023-06-06 ENCOUNTER — Encounter (HOSPITAL_COMMUNITY): Payer: Self-pay

## 2023-06-06 ENCOUNTER — Ambulatory Visit (HOSPITAL_COMMUNITY)
Admission: EM | Admit: 2023-06-06 | Discharge: 2023-06-06 | Disposition: A | Discharge status: Home / Self Care | Payer: 59 | Attending: Family Medicine | Admitting: Family Medicine

## 2023-06-06 DIAGNOSIS — Z1152 Encounter for screening for COVID-19: Secondary | ICD-10-CM | POA: Diagnosis not present

## 2023-06-06 DIAGNOSIS — R059 Cough, unspecified: Secondary | ICD-10-CM | POA: Diagnosis present

## 2023-06-06 DIAGNOSIS — J029 Acute pharyngitis, unspecified: Secondary | ICD-10-CM | POA: Diagnosis present

## 2023-06-06 DIAGNOSIS — J069 Acute upper respiratory infection, unspecified: Secondary | ICD-10-CM | POA: Diagnosis not present

## 2023-06-06 MED ORDER — BENZONATATE 100 MG PO CAPS: 100.0000 mg | ORAL_CAPSULE | Freq: Three times a day (TID) | ORAL | 0 refills | Status: DC | PRN

## 2023-06-06 NOTE — ED Triage Notes (Signed)
Pt presents with c/o nasal congestion, sore throat x 3 days. States she has taken OTC cold medicine, nothing has given relief.   Denies fever, has chills.

## 2023-06-06 NOTE — ED Provider Notes (Signed)
[facility]    CSN: [id number] Arrival date & time: 06/06/23  1344      History   Chief Complaint Chief Complaint  Patient presents with   Sore Throat   Nasal Congestion    HPI Taylor Santiago is a 67 y.o. female.    Sore Throat  Here for nasal congestion, cough, and sore throat.  Symptoms began on the evening of August 23.  She has maybe felt her breathing be a little heavy.  No vomiting or diarrhea.  Her right ear has bothered her a little bit also.    Past Medical History:  Diagnosis Date   Abnormal LFTs (liver function tests)    Cirrhosis (HCC)    Hepatitis C virus infection    Patient stated she has been treated   Hepatitis C, chronic (HCC) 2004   genotype 1a   Hyperlipidemia    Medical history non-contributory    Thrombocytopenia [facility])     Patient Active Problem List   Diagnosis Date Noted   Annual physical exam 05/02/2023   Pruritus 05/02/2023   Hepatic fibrosis 05/02/2023   Tingling in extremities 05/02/2023   COVID-19 vaccination declined 05/02/2023   Hepatitis C carrier (HCC) 04/18/2022   Osteoarthritis of hands, bilateral 06/09/2021   Rheumatoid factor positive 05/25/2021   Thrombocytopenia (HCC) 04/05/2021   Other chest pain 04/05/2021   Arthralgia 04/05/2021   History of tobacco use 04/05/2021   Smoker 02/01/2021   Hepatic steatosis 02/01/2021   Age-related osteoporosis without current pathological fracture 10/02/2018   Achilles tendinitis of left lower extremity 02/24/2016   Left hand weakness 05/15/2014   Numbness and tingling in left arm 05/15/2014   Hepatitis C virus infection cured after antiviral drug therapy 05/15/2014    Past Surgical History:  Procedure Laterality Date   ABDOMINAL HYSTERECTOMY     APPENDECTOMY     COLONOSCOPY  2013   IR TRANSCATHETER BX  07/09/2019   IR US GUIDE VASC ACCESS RIGHT  07/09/2019   IR VENOGRAM HEPATIC W HEMODYNAMIC EVALUATION  07/09/2019   PARTIAL HYSTERECTOMY      OB History      Gravida  1   Para  1   Term      Preterm      AB      Living  1      SAB      IAB      Ectopic      Multiple      Live Births               Home Medications    Prior to Admission medications   Medication Sig Start Date End Date Taking? Authorizing Provider  benzonatate (TESSALON) 100 MG capsule Take 1 capsule (100 mg total) by mouth 3 (three) times daily as needed for cough. 06/06/23  Yes Zenia Resides, MD    Family History Family History  Problem Relation Age of Onset   Hypertension Mother    Diabetes Mother    Ovarian cancer Mother    Heart Problems Brother    Throat cancer Brother    Heart disease Maternal Grandmother    Heart disease Maternal Grandfather    Healthy Daughter    Colon cancer Neg Hx    Breast cancer Neg Hx     Social History Social History   Tobacco Use   Smoking status: Former    Current packs/day: 0.00    Average packs/day: 0.5 packs/day for 26.0 years (13.0  ttl pk-yrs)    Types: Cigarettes    Start date: 59    Quit date: 2000    Years since quitting: 24.6   Smokeless tobacco: Never  Vaping Use   Vaping status: Never Used  Substance Use Topics   Alcohol use: Not Currently    Comment: Occasionally   Drug use: No     Allergies   Hydrocodone, Penicillins, Percocet [oxycodone-acetaminophen], and Prednisone   Review of Systems Review of Systems   Physical Exam Triage Vital Signs ED Triage Vitals  Encounter Vitals Group     BP 06/06/23 1454 (!) 148/75     Systolic BP Percentile --      Diastolic BP Percentile --      Pulse Rate 06/06/23 1452 64     Resp 06/06/23 1452 18     Temp 06/06/23 1452 97.8 F (36.6 C)     Temp Source 06/06/23 1452 Oral     SpO2 06/06/23 1452 98 %     Weight --      Height --      Head Circumference --      Peak Flow --      Pain Score 06/06/23 1452 5     Pain Loc --      Pain Education --      Exclude from Growth Chart --    No data found.  Updated Vital Signs BP  (!) 148/75   Pulse 64   Temp 97.8 F (36.6 C) (Oral)   Resp 18   SpO2 98%   Visual Acuity Right Eye Distance:   Left Eye Distance:   Bilateral Distance:    Right Eye Near:   Left Eye Near:    Bilateral Near:     Physical Exam Vitals reviewed.  Constitutional:      General: She is not in acute distress.    Appearance: She is not ill-appearing, toxic-appearing or diaphoretic.  HENT:     Right Ear: Tympanic membrane and ear canal normal.     Left Ear: Tympanic membrane and ear canal normal.     Nose: Nose normal.     Mouth/Throat:     Mouth: Mucous membranes are moist.     Pharynx: No oropharyngeal exudate or posterior oropharyngeal erythema.     Comments: There is no erythema.  There is a little bit of clear drainage in the posterior oropharynx Eyes:     Extraocular Movements: Extraocular movements intact.     Conjunctiva/sclera: Conjunctivae normal.     Pupils: Pupils are equal, round, and reactive to light.  Cardiovascular:     Rate and Rhythm: Normal rate and regular rhythm.     Heart sounds: No murmur heard. Pulmonary:     Effort: Pulmonary effort is normal. No respiratory distress.     Breath sounds: No stridor. No wheezing, rhonchi or rales.  Musculoskeletal:     Cervical back: Neck supple.  Lymphadenopathy:     Cervical: No cervical adenopathy.  Skin:    Capillary Refill: Capillary refill takes less than 2 seconds.     Coloration: Skin is not jaundiced or pale.  Neurological:     General: No focal deficit present.     Mental Status: She is alert and oriented to person, place, and time.  Psychiatric:        Behavior: Behavior normal.      UC Treatments / Results  Labs (all labs ordered are listed, but only abnormal results are displayed) Labs Reviewed  SARS CORONAVIRUS 2 (TAT 6-24 HRS)    EKG   Radiology No results found.  Procedures Procedures (including critical care time)  Medications Ordered in UC Medications - No data to  display  Initial Impression / Assessment and Plan / UC Course  I have reviewed the triage vital signs and the nursing notes.  Pertinent labs & imaging results that were available during my care of the patient were reviewed by me and considered in my medical decision making (see chart for details).        COVID swab is done.  If positive staff will notify her and she is a candidate for Paxlovid.  Her last EGFR was 82 in July of this year.  Tessalon Perles were sent in for cough. Final Clinical Impressions(s) / UC Diagnoses   Final diagnoses:  Viral URI with cough     Discharge Instructions      Take benzonatate 100 mg, 1 tab every 8 hours as needed for cough.   You have been swabbed for COVID, and the test will result in the next 24 hours. Our staff will call you if positive. If the COVID test is positive, you should quarantine until you are fever free for 24 hours and you are starting to feel better, and then take added precautions for the next 5 days, such as physical distancing/wearing a mask and good hand hygiene/washing.       ED Prescriptions     Medication Sig Dispense Auth. Provider   benzonatate (TESSALON) 100 MG capsule Take 1 capsule (100 mg total) by mouth 3 (three) times daily as needed for cough. 21 capsule Zenia Resides, MD      PDMP not reviewed this encounter.   Zenia Resides, MD 06/06/23 1536

## 2023-06-06 NOTE — Discharge Instructions (Signed)
Take benzonatate 100 mg, 1 tab every 8 hours as needed for cough.   You have been swabbed for COVID, and the test will result in the next 24 hours. Our staff will call you if positive. If the COVID test is positive, you should quarantine until you are fever free for 24 hours and you are starting to feel better, and then take added precautions for the next 5 days, such as physical distancing/wearing a mask and good hand hygiene/washing.  

## 2023-06-07 LAB — SARS CORONAVIRUS 2 (TAT 6-24 HRS): SARS Coronavirus 2: NEGATIVE

## 2023-06-28 ENCOUNTER — Other Ambulatory Visit: Payer: 59

## 2023-07-03 ENCOUNTER — Other Ambulatory Visit: Payer: Self-pay | Admitting: Internal Medicine

## 2023-07-03 DIAGNOSIS — Z1231 Encounter for screening mammogram for malignant neoplasm of breast: Secondary | ICD-10-CM

## 2023-07-04 ENCOUNTER — Ambulatory Visit
Admission: RE | Admit: 2023-07-04 | Discharge: 2023-07-04 | Disposition: A | Discharge status: Home / Self Care | Payer: 59 | Source: Ambulatory Visit | Attending: Nurse Practitioner | Admitting: Nurse Practitioner

## 2023-07-04 DIAGNOSIS — K7402 Hepatic fibrosis, advanced fibrosis: Secondary | ICD-10-CM

## 2023-07-04 DIAGNOSIS — K760 Fatty (change of) liver, not elsewhere classified: Secondary | ICD-10-CM | POA: Diagnosis not present

## 2023-08-04 ENCOUNTER — Ambulatory Visit
Admission: RE | Admit: 2023-08-04 | Discharge: 2023-08-04 | Disposition: A | Discharge status: Home / Self Care | Payer: 59 | Source: Ambulatory Visit | Attending: Internal Medicine | Admitting: Internal Medicine

## 2023-08-04 DIAGNOSIS — Z1231 Encounter for screening mammogram for malignant neoplasm of breast: Secondary | ICD-10-CM

## 2023-10-07 ENCOUNTER — Encounter (HOSPITAL_COMMUNITY): Payer: Self-pay

## 2023-10-07 ENCOUNTER — Emergency Department (HOSPITAL_COMMUNITY)
Admission: EM | Admit: 2023-10-07 | Discharge: 2023-10-07 | Disposition: A | Discharge status: Home / Self Care | Payer: 59 | Attending: Emergency Medicine | Admitting: Emergency Medicine

## 2023-10-07 ENCOUNTER — Emergency Department (HOSPITAL_COMMUNITY): Payer: 59

## 2023-10-07 ENCOUNTER — Other Ambulatory Visit: Payer: Self-pay

## 2023-10-07 DIAGNOSIS — R079 Chest pain, unspecified: Secondary | ICD-10-CM | POA: Diagnosis not present

## 2023-10-07 DIAGNOSIS — Q676 Pectus excavatum: Secondary | ICD-10-CM | POA: Diagnosis not present

## 2023-10-07 DIAGNOSIS — R0789 Other chest pain: Secondary | ICD-10-CM | POA: Diagnosis not present

## 2023-10-07 LAB — CBC WITH DIFFERENTIAL/PLATELET
Abs Immature Granulocytes: 0.01 10*3/uL (ref 0.00–0.07)
Basophils Absolute: 0 10*3/uL (ref 0.0–0.1)
Basophils Relative: 1 %
Eosinophils Absolute: 0.1 10*3/uL (ref 0.0–0.5)
Eosinophils Relative: 2 %
HCT: 35.8 % — ABNORMAL LOW (ref 36.0–46.0)
Hemoglobin: 11.5 g/dL — ABNORMAL LOW (ref 12.0–15.0)
Immature Granulocytes: 0 %
Lymphocytes Relative: 44 %
Lymphs Abs: 1.9 10*3/uL (ref 0.7–4.0)
MCH: 30 pg (ref 26.0–34.0)
MCHC: 32.1 g/dL (ref 30.0–36.0)
MCV: 93.5 fL (ref 80.0–100.0)
Monocytes Absolute: 0.4 10*3/uL (ref 0.1–1.0)
Monocytes Relative: 10 %
Neutro Abs: 1.8 10*3/uL (ref 1.7–7.7)
Neutrophils Relative %: 43 %
Platelets: 114 10*3/uL — ABNORMAL LOW (ref 150–400)
RBC: 3.83 MIL/uL — ABNORMAL LOW (ref 3.87–5.11)
RDW: 13.2 % (ref 11.5–15.5)
WBC: 4.2 10*3/uL (ref 4.0–10.5)
nRBC: 0 % (ref 0.0–0.2)

## 2023-10-07 LAB — BASIC METABOLIC PANEL
Anion gap: 9 (ref 5–15)
BUN: 9 mg/dL (ref 8–23)
CO2: 24 mmol/L (ref 22–32)
Calcium: 9 mg/dL (ref 8.9–10.3)
Chloride: 103 mmol/L (ref 98–111)
Creatinine, Ser: 0.82 mg/dL (ref 0.44–1.00)
GFR, Estimated: >60 mL/min (ref >60)
Glucose, Bld: 120 mg/dL — ABNORMAL HIGH (ref 70–99)
Potassium: 4.1 mmol/L (ref 3.5–5.1)
Sodium: 136 mmol/L (ref 135–145)

## 2023-10-07 LAB — TROPONIN I (HIGH SENSITIVITY): Troponin I (High Sensitivity): 4 ng/L (ref <18)

## 2023-10-07 NOTE — Discharge Instructions (Signed)
It does not look like you are having a heart attack.  As we discussed the most likely thing is that you have strained the muscles in your chest wall.  Please follow-up with your family doctor in the office.   Take 4 over the counter ibuprofen tablets 3 times a day or 2 over-the-counter naproxen tablets twice a day for pain. Also take tylenol 1000mg (2 extra strength) four times a day.

## 2023-10-07 NOTE — ED Notes (Signed)
Called no answer x1

## 2023-10-07 NOTE — ED Triage Notes (Signed)
Reports chest pain that started on christmas day and has not got better.  Reports it is in the middle of her chest and goes into neck and back. Denies sob n/v/ diaphoresis

## 2023-10-07 NOTE — ED Provider Notes (Addendum)
[place] EMERGENCY DEPARTMENT AT [facility] Provider Note   CSN: [id number] Arrival date & time: 10/07/23  0818     History  Chief Complaint  Patient presents with   Chest Pain    Taylor Santiago is a 67 y.o. female.  67 yo F with a chief complaint of chest pain.  This is left-sided, swelling lasting about a week. she feels like it goes up into her neck.  Going on for about a week or 2.  No injury.  She has been coughing a bit feels like this happens to her when she gets an upper respiratory illness.  Patient denies history of MI, denies hypertension hyperlipidemia diabetes.  Denies family history of MI.  She smokes occasionally  Patient denies history of PE or DVT denies hemoptysis denies unilateral lower extremity edema denies recent surgery immobilization hospitalization estrogen use or history of cancer.     Chest Pain      Home Medications Prior to Admission medications   Medication Sig Start Date End Date Taking? Authorizing Provider  benzonatate (TESSALON) 100 MG capsule Take 1 capsule (100 mg total) by mouth 3 (three) times daily as needed for cough. 06/06/23   Zenia Resides, MD      Allergies    Hydrocodone, Penicillins, Percocet [oxycodone-acetaminophen], and Prednisone    Review of Systems   Review of Systems  Cardiovascular:  Positive for chest pain.    Physical Exam Updated Vital Signs BP 135/80 (BP Location: Right Arm)   Pulse 66   Temp 98.2 F (36.8 C)   Resp 18   Ht 5' 5.6" (1.666 m)   Wt 59.9 kg   SpO2 100%   BMI 21.57 kg/m  Physical Exam Vitals and nursing note reviewed.  Constitutional:      General: She is not in acute distress.    Appearance: She is well-developed. She is not diaphoretic.  HENT:     Head: Normocephalic and atraumatic.  Eyes:     Pupils: Pupils are equal, round, and reactive to light.  Cardiovascular:     Rate and Rhythm: Normal rate and regular rhythm.     Heart sounds: No murmur heard.    No  friction rub. No gallop.  Pulmonary:     Effort: Pulmonary effort is normal.     Breath sounds: No wheezing or rales.  Chest:     Chest wall: Tenderness present.     Comments: Palpation of the left anterior chest wall reproduces her discomfort. Abdominal:     General: There is no distension.     Palpations: Abdomen is soft.     Tenderness: There is no abdominal tenderness.  Musculoskeletal:        General: No tenderness.     Cervical back: Normal range of motion and neck supple.  Skin:    General: Skin is warm and dry.  Neurological:     Mental Status: She is alert and oriented to person, place, and time.  Psychiatric:        Behavior: Behavior normal.     ED Results / Procedures / Treatments   Labs (all labs ordered are listed, but only abnormal results are displayed) Labs Reviewed  BASIC METABOLIC PANEL - Abnormal; Notable for the following components:      Result Value   Glucose, Bld 120 (*)    All other components within normal limits  CBC WITH DIFFERENTIAL/PLATELET - Abnormal; Notable for the following components:   RBC 3.83 (*)  Hemoglobin 11.5 (*)    HCT 35.8 (*)    Platelets 114 (*)    All other components within normal limits  TROPONIN I (HIGH SENSITIVITY)  TROPONIN I (HIGH SENSITIVITY)    EKG EKG Interpretation Date/Time:  Saturday October 07 2023 08:31:54 EST Ventricular Rate:  59 PR Interval:  192 QRS Duration:  68 QT Interval:  402 QTC Calculation: 397 R Axis:   63  Text Interpretation: Sinus bradycardia with sinus arrhythmia Cannot rule out Anterior infarct , age undetermined Abnormal ECG No significant change since last tracing Confirmed by Melene Plan [phone]) on 10/07/2023 9:39:00 AM  Radiology DG Chest 2 View Result Date: 10/07/2023 CLINICAL DATA:  Chest pain. EXAM: CHEST - 2 VIEW COMPARISON:  09/13/2021 FINDINGS: The heart size and mediastinal contours are within normal limits. Both lungs are clear. Mild pectus excavatum noted. IMPRESSION:  No active cardiopulmonary disease. Electronically Signed   By: Danae Orleans M.D.   On: 10/07/2023 09:12    Procedures Procedures   Discussed smoking cessation with patient and was they were offerred resources to help stop.  Total time was 5 min CPT code 52841.    Medications Ordered in ED Medications - No data to display  ED Course/ Medical Decision Making/ A&P                                 Medical Decision Making Amount and/or Complexity of Data Reviewed Labs: ordered. Radiology: ordered.   67 yo F with a chief complaint of chest pain.  Left-sided going on for about a week.  Atypical in nature and reproduced on exam.  Troponin negative, no significant electrolyte abnormalities, no acute anemia.  Pain greater than 1 week without significant change do not feel delta is warranted.  Chest x-ray independently interpreted by me without focal infiltrate or pneumothorax.  Will discharge home.  PCP follow-up.  12:27 PM:  I have discussed the diagnosis/risks/treatment options with the patient.  Evaluation and diagnostic testing in the emergency department does not suggest an emergent condition requiring admission or immediate intervention beyond what has been performed at this time.  They will follow up with PCP. We also discussed returning to the ED immediately if new or worsening sx occur. We discussed the sx which are most concerning (e.g., sudden worsening pain, fever, inability to tolerate by mouth) that necessitate immediate return. Medications administered to the patient during their visit and any new prescriptions provided to the patient are listed below.  Medications given during this visit Medications - No data to display   The patient appears reasonably screen and/or stabilized for discharge and I doubt any other medical condition or other [facility] requiring further screening, evaluation, or treatment in the ED at this time prior to discharge.          Final Clinical  Impression(s) / ED Diagnoses Final diagnoses:  Nonspecific chest pain    Rx / DC Orders ED Discharge Orders     None         Melene Plan, DO 10/07/23 1225    Melene Plan, DO 10/07/23 1227

## 2023-10-18 ENCOUNTER — Telehealth: Payer: Self-pay | Admitting: *Deleted

## 2023-10-18 NOTE — Progress Notes (Signed)
 Complex Care Management Note Care Guide Note  10/18/2023 Name: Taylor Santiago MRN: [id number] DOB: [birth year]  Taylor Santiago is a 68 y.o. year old female who is a primary care patient of Jarold Medici, MD . The community resource team was consulted for assistance with Patient states she has called 2x and left messages about a hospital f/u visit with no response please reach out to patient   SDOH screenings and interventions completed:      Care guide performed the following interventions: Patient provided with information about care guide support team and interviewed to confirm resource needs. Patient states she has called 2x and left messages about a hospital f/u visit with no response please reach out to patient emailed scheduling though epic to reach out to patient  Follow Up Plan:  No further follow up planned at this time. The patient has been provided with needed resources.  Encounter Outcome:  Patient Visit Completed  [facility] Dunk[facility]  Population Health Careguide  Direct Dial: [phone] Website: [email or web address]

## 2023-10-18 NOTE — Progress Notes (Signed)
 Transition Care Management Follow-up Telephone Call Date of discharge and from where: [facility] [place] 10/07/2023 How have you been since you were released from the hospital? States she would like to have pcp call her back about an appt . Says she has called and left a few messages and nobody has returned her calls as she is still having some pain  Any questions or concerns? No  Items Reviewed: Did the pt receive and understand the discharge instructions provided? Yes  Medications obtained and verified? Yes  Other? No  Any new allergies since your discharge? No  Dietary orders reviewed? No Do you have support at home? Yes        Follow up appointments reviewed:  PCP Hospital f/u appt confirmed? No  States she would like to have pcp call her back about an appt . Says she has called and left a few messages and nobody has returned her calls  Specialist Hospital f/u appt confirmed? No   Are transportation arrangements needed? No  If their condition worsens, is the pt aware to call PCP or go to the Emergency Dept.? No Was the patient provided with contact information for the PCP's office or ED? Yes Was to pt encouraged to call back with questions or concerns? Yes

## 2023-10-19 ENCOUNTER — Telehealth: Payer: Self-pay

## 2023-10-19 ENCOUNTER — Telehealth: Payer: Self-pay | Admitting: *Deleted

## 2023-10-19 NOTE — Progress Notes (Signed)
 Complex Care Management Note Care Guide Note  10/19/2023 Name: Taylor Santiago MRN: [id number] DOB: 09-16-1956  Taylor Santiago is a 68 y.o. year old female who is a primary care patient of Jarold Medici, MD . The community resource team was consulted for assistance with  Scheduling an appt .  SDOH screenings and interventions completed:  No     Care guide performed the following interventions: No response from email sent to practice , messaged care guide Harlene who was able to reach out to CMA so they can reach out to [name] followup patient .  Follow Up Plan:  No further follow up planned at this time. The patient has been provided with needed resources.  Encounter Outcome:  Patient Visit Completed  [facility]enn[facility]  Population Health Careguide  Direct Dial: [phone] Website: [email or web address]

## 2023-10-19 NOTE — Transitions of Care (Post Inpatient/ED Visit) (Signed)
   10/19/2023  Name: Taylor Santiago MRN: [id number] DOB: 03-23-1956  Today's TOC FU Call Status: Today's TOC FU Call Status:: Successful TOC FU Call Completed TOC FU Call Complete Date: 10/19/23 Patient's Name and Date of Birth confirmed.  Transition Care Management Follow-up Telephone Call Date of Discharge: 10/07/23 Discharge Facility: [facility]) Type of Discharge: Emergency Department Reason for ED Visit: Other: How have you been since you were released from the hospital?: Better Any questions or concerns?: No  Items Reviewed: Did you receive and understand the discharge instructions provided?: Yes Medications obtained,verified, and reconciled?: Yes (Medications Reviewed) Any new allergies since your discharge?: No Dietary orders reviewed?: No Do you have support at home?: No  Medications Reviewed Today: Medications Reviewed Today   Medications were not reviewed in this encounter     Home Care and Equipment/Supplies: Were Home Health Services Ordered?: NA Any new equipment or medical supplies ordered?: NA  Functional Questionnaire: Do you need assistance with bathing/showering or dressing?: No Do you need assistance with meal preparation?: No Do you need assistance with eating?: No Do you have difficulty maintaining continence: No Do you need assistance with getting out of bed/getting out of a chair/moving?: No Do you have difficulty managing or taking your medications?: No  Follow up appointments reviewed: PCP Follow-up appointment confirmed?: Yes Date of PCP follow-up appointment?: 10/25/23 Follow-up Provider: Br[facility] Creighton [facility] Follow-up appointment confirmed?: NA Do you need transportation to your follow-up appointment?: No Do you understand care options if your condition(s) worsen?: Yes-patient verbalized understanding    SIGNATURE YL,RMA

## 2023-10-22 ENCOUNTER — Other Ambulatory Visit: Payer: Self-pay

## 2023-10-22 ENCOUNTER — Encounter (HOSPITAL_COMMUNITY): Payer: Self-pay | Admitting: Emergency Medicine

## 2023-10-22 ENCOUNTER — Emergency Department (HOSPITAL_COMMUNITY)
Admission: EM | Admit: 2023-10-22 | Discharge: 2023-10-22 | Disposition: A | Discharge status: Home / Self Care | Payer: 59 | Attending: Emergency Medicine | Admitting: Emergency Medicine

## 2023-10-22 ENCOUNTER — Emergency Department (HOSPITAL_COMMUNITY): Payer: 59

## 2023-10-22 DIAGNOSIS — M5442 Lumbago with sciatica, left side: Secondary | ICD-10-CM | POA: Insufficient documentation

## 2023-10-22 DIAGNOSIS — X500XXA Overexertion from strenuous movement or load, initial encounter: Secondary | ICD-10-CM | POA: Diagnosis not present

## 2023-10-22 DIAGNOSIS — M545 Low back pain, unspecified: Secondary | ICD-10-CM | POA: Diagnosis present

## 2023-10-22 DIAGNOSIS — M5136 Other intervertebral disc degeneration, lumbar region with discogenic back pain only: Secondary | ICD-10-CM | POA: Diagnosis not present

## 2023-10-22 MED ORDER — METHOCARBAMOL 500 MG PO TABS: 500.0000 mg | ORAL_TABLET | Freq: Four times a day (QID) | ORAL | 0 refills | Status: DC

## 2023-10-22 MED ORDER — DICLOFENAC SODIUM 75 MG PO TBEC: 75.0000 mg | DELAYED_RELEASE_TABLET | Freq: Two times a day (BID) | ORAL | 0 refills | Status: DC

## 2023-10-22 NOTE — Discharge Instructions (Addendum)
 Follow up with your Physicain for recheck

## 2023-10-22 NOTE — ED Triage Notes (Signed)
 Pt endorses left sided lower back pain that radiates down her butt and leg since Thursday. Pt noticed pain started after lifting a case of water. Pt taking tylenol for pain.

## 2023-10-22 NOTE — ED Provider Notes (Signed)
 [facility] Provider Note   CSN: [id number] Arrival date & time: 10/22/23  9175     History  Chief Complaint  Patient presents with   Back Pain    Taylor Santiago is a 68 y.o. female.  Patient reports that she began having pain in her left low back that radiates into her left leg 4 days ago.  Patient reports the pain began when she was lifting a case of 24 bottles of water.  Patient reports that she has been taking Tylenol  and ibuprofen  without relief.  Patient reports she is scheduled to see her primary care physician on Wednesday for recheck of previous visit to the emergency department.  She reports on 1228 she was seen and evaluated for a muscle strain to her chest.  Patient reports that she had a chest x-ray and labs at that time.  Patient reports her chest is feeling better she is not currently having any chest pain.  Patient denies any numbness or weakness in her lower extremities.  Patient denies any burning with urination.  No loss of bowel or bladder control.  The history is provided by the patient. No language interpreter was used.  Back Pain      Home Medications Prior to Admission medications   Medication Sig Start Date End Date Taking? Authorizing Provider  benzonatate  (TESSALON ) 100 MG capsule Take 1 capsule (100 mg total) by mouth 3 (three) times daily as needed for cough. 06/06/23   Vonna Sharlet POUR, MD      Allergies    Hydrocodone, Penicillins, Percocet [oxycodone -acetaminophen ], and Prednisone     Review of Systems   Review of Systems  Musculoskeletal:  Positive for back pain.  All other systems reviewed and are negative.   Physical Exam Updated Vital Signs BP (!) 149/85   Pulse 75   Temp 98.1 F (36.7 C)   Resp (!) 22   Ht 5' 5.5 (1.664 m)   Wt 60 kg   SpO2 99%   BMI 21.68 kg/m  Physical Exam Vitals and nursing note reviewed.  Constitutional:      Appearance: She is well-developed.  HENT:      Head: Normocephalic.     Nose: Nose normal.  Cardiovascular:     Rate and Rhythm: Normal rate and regular rhythm.     Pulses: Normal pulses.  Pulmonary:     Effort: Pulmonary effort is normal.  Abdominal:     General: There is no distension.  Musculoskeletal:        General: Tenderness present.     Cervical back: Normal range of motion.     Comments: Diffusely tender lumbar spine,  pain with movement, nv and ns intact   Skin:    General: Skin is warm.  Neurological:     General: No focal deficit present.     Mental Status: She is alert and oriented to person, place, and time.  Psychiatric:        Mood and Affect: Mood normal.     ED Results / Procedures / Treatments   Labs (all labs ordered are listed, but only abnormal results are displayed) Labs Reviewed - No data to display  EKG None  Radiology DG Lumbar Spine Complete Result Date: 10/22/2023 CLINICAL DATA:  Pain. EXAM: LUMBAR SPINE - COMPLETE 4+ VIEW COMPARISON:  None Available. FINDINGS: No evidence for an acute fracture or subluxation. Loss of disc height with endplate spurring is seen at L2-3. Facets are well  aligned bilaterally. SI joints unremarkable. IMPRESSION: Degenerative disc disease at L2-3. No acute bony findings. Electronically Signed   By: Camellia Candle M.D.   On: 10/22/2023 09:55    Procedures Procedures    Medications Ordered in ED Medications - No data to display  ED Course/ Medical Decision Making/ A&P                                 Medical Decision Making Patient complains of low back pain after lifting a 24 pack of water.  Patient reports she has pain going down her left leg.  Amount and/or Complexity of Data Reviewed Radiology: ordered and independent interpretation performed. Decision-making details documented in ED Course.    Details: Spray LS-spine shows generative changes L2-L3.  Risk Risk Details: Patient counseled on results.  Patient is given a note for out of work.  I will try  her on a short course of Voltaren  and Robaxin .  Patient has a past medical history of an allergy to prednisone .  Patient is advised to keep follow-up appointment with her primary care physician.           Final Clinical Impression(s) / ED Diagnoses Final diagnoses:  Acute left-sided low back pain with left-sided sciatica    Rx / DC Orders ED Discharge Orders          Ordered    diclofenac  (VOLTAREN ) 75 MG EC tablet  2 times daily        10/22/23 1059    methocarbamol  (ROBAXIN ) 500 MG tablet  4 times daily        10/22/23 1059          An After Visit Summary was printed and given to the patient.     Flint Sonny POUR, PA-C 10/22/23 1059    Kingsley, Victoria K, [place] 10/22/23 1214

## 2023-10-25 ENCOUNTER — Encounter: Payer: Self-pay | Admitting: Family Medicine

## 2023-10-25 ENCOUNTER — Ambulatory Visit (INDEPENDENT_AMBULATORY_CARE_PROVIDER_SITE_OTHER): Payer: 59 | Admitting: Family Medicine

## 2023-10-25 VITALS — BP 120/60 | HR 81 | Temp 98.2°F | Ht 65.5 in | Wt 139.0 lb

## 2023-10-25 DIAGNOSIS — M51361 Other intervertebral disc degeneration, lumbar region with lower extremity pain only: Secondary | ICD-10-CM

## 2023-10-25 DIAGNOSIS — Z23 Encounter for immunization: Secondary | ICD-10-CM | POA: Diagnosis not present

## 2023-10-25 NOTE — Progress Notes (Signed)
I,[name], CMA,acting as a Neurosurgeon for [employer], NP.,have documented all relevant documentation on the behalf of Ellender Hose, NP,as directed by  Ellender Hose, NP while in the presence of Ellender Hose, NP.  Subjective:  Patient ID: Taylor Santiago , female    DOB: [birth year] , 68 y.o.   MRN: [id number]  Chief Complaint  Patient presents with   ER f/u    HPI  Patient is a 69 year old female who presents today for ER follow up. She went to the ER on 10/07/23 for chest pain and then she went back on 10/22/23 for acute left lumbar pain with sciatica.Lumbar x-ray showed degenerative changes in L2-L3 and patient got a prescription of diclofenac 75 mg twice daily as needed and robaxin 500 mg four times daily as needed. Patient reports she is feeling much better today than she was then.She has no questions or concerns today.     Past Medical History:  Diagnosis Date   Abnormal LFTs (liver function tests)    Cirrhosis (HCC)    Hepatitis C virus infection    Patient stated she has been treated   Hepatitis C, chronic (HCC) 2004   genotype 1a   Hyperlipidemia    Medical history non-contributory    Thrombocytopenia (HCC)      Family History  Problem Relation Age of Onset   Hypertension Mother    Diabetes Mother    Ovarian cancer Mother    Heart Problems Brother    Throat cancer Brother    Heart disease Maternal Grandmother    Heart disease Maternal Grandfather    Healthy Daughter    Colon cancer Neg Hx    Breast cancer Neg Hx      Current Outpatient Medications:    benzonatate (TESSALON) 100 MG capsule, Take 1 capsule (100 mg total) by mouth 3 (three) times daily as needed for cough., Disp: 21 capsule, Rfl: 0   diclofenac (VOLTAREN) 75 MG EC tablet, Take 1 tablet (75 mg total) by mouth 2 (two) times daily., Disp: 14 tablet, Rfl: 0   methocarbamol (ROBAXIN) 500 MG tablet, Take 1 tablet (500 mg total) by mouth 4 (four) times daily., Disp: 20 tablet, Rfl: 0   Allergies   Allergen Reactions   Hydrocodone Itching   Penicillins Hives    Sweats Has patient had a PCN reaction causing immediate rash, facial/tongue/throat swelling, SOB or lightheadedness with hypotension: No Has patient had a PCN reaction causing severe rash involving mucus membranes or skin necrosis:No Has patient had a PCN reaction that required hospitalization No Has patient had a PCN reaction occurring within the last 10 years: no   If all of the above answers are "NO", then may proceed with Cephalosporin use.    Percocet [Oxycodone-Acetaminophen]    Prednisone Rash     Review of Systems  Constitutional: Negative.   HENT: Negative.    Eyes: Negative.   Respiratory: Negative.    Cardiovascular: Negative.  Negative for chest pain, palpitations and leg swelling.  Endocrine: Negative.   Genitourinary: Negative.   Musculoskeletal:  Positive for back pain.  Skin: Negative.   Neurological: Negative.   Psychiatric/Behavioral: Negative.       Today's Vitals   10/25/23 1434  BP: 120/60  Pulse: 81  Temp: 98.2 F (36.8 C)  TempSrc: Oral  Weight: 139 lb (63 kg)  Height: 5' 5.5" (1.664 m)  PainSc: 0-No pain   Body mass index is 22.78 kg/m.  Wt Readings from Last 3  Encounters:  10/25/23 139 lb (63 kg)  10/22/23 132 lb 4.4 oz (60 kg)  10/07/23 132 lb (59.9 kg)    The 10-year ASCVD risk score (Arnett DK, et al., 2019) is: 6.9%   Values used to calculate the score:     Age: 53 years     Sex: Female     Is Non-Hispanic African American: Yes     Diabetic: No     Tobacco smoker: No     Systolic Blood Pressure: 120 mmHg     Is BP treated: No     HDL Cholesterol: 57 mg/dL     Total Cholesterol: 167 mg/dL  Objective:  Physical Exam HENT:     Head: Normocephalic.  Cardiovascular:     Rate and Rhythm: Normal rate and regular rhythm.  Pulmonary:     Effort: Pulmonary effort is normal.     Breath sounds: Normal breath sounds.  Abdominal:     General: Bowel sounds are normal.   Musculoskeletal:        General: Normal range of motion.  Skin:    General: Skin is warm and dry.  Neurological:     Mental Status: She is alert and oriented to person, place, and time.  Psychiatric:        Mood and Affect: Mood normal.        Behavior: Behavior normal.         Assessment And Plan:  Degeneration of intervertebral disc of lumbar region with lower extremity pain  Immunization due -     Flu Vaccine Trivalent High Dose (Fluad)    Return if symptoms worsen or fail to improve, for keep scheduled appt.  Patient was given opportunity to ask questions. Patient verbalized understanding of the plan and was able to repeat key elements of the plan. All questions were answered to their satisfaction.    I, Ellender Hose, NP, have reviewed all documentation for this visit. The documentation on 10/26/23 for the exam, diagnosis, procedures, and orders are all accurate and complete.   IF YOU HAVE BEEN REFERRED TO A SPECIALIST, IT MAY TAKE 1-2 WEEKS TO SCHEDULE/PROCESS THE REFERRAL. IF YOU HAVE NOT HEARD FROM US/SPECIALIST IN TWO WEEKS, PLEASE GIVE [place] A CALL AT [phone].

## 2023-10-30 ENCOUNTER — Telehealth: Payer: Self-pay | Admitting: *Deleted

## 2023-10-30 NOTE — Progress Notes (Signed)
Transition Care Management Unsuccessful Follow-up Telephone Call  Date of discharge and from where:  [facility] [place]. [facility]  10/22/2023  Attempts:  1st Attempt  Reason for unsuccessful TCM follow-up call:  No answer/busy

## 2023-10-31 ENCOUNTER — Telehealth: Payer: Self-pay | Admitting: *Deleted

## 2023-10-31 NOTE — Progress Notes (Signed)
Transition Care Management Unsuccessful Follow-up Telephone Call  Date of discharge and from where:  [facility] [place]  10/22/2023  Attempts:  2nd Attempt  Reason for unsuccessful TCM follow-up call:  No answer/busy

## 2023-12-05 ENCOUNTER — Other Ambulatory Visit: Payer: Self-pay | Admitting: Nurse Practitioner

## 2023-12-05 DIAGNOSIS — K760 Fatty (change of) liver, not elsewhere classified: Secondary | ICD-10-CM | POA: Diagnosis not present

## 2023-12-05 DIAGNOSIS — K7402 Hepatic fibrosis, advanced fibrosis: Secondary | ICD-10-CM | POA: Diagnosis not present

## 2023-12-06 DIAGNOSIS — K760 Fatty (change of) liver, not elsewhere classified: Secondary | ICD-10-CM | POA: Diagnosis not present

## 2023-12-06 DIAGNOSIS — K7402 Hepatic fibrosis, advanced fibrosis: Secondary | ICD-10-CM | POA: Diagnosis not present

## 2023-12-14 ENCOUNTER — Ambulatory Visit
Admission: RE | Admit: 2023-12-14 | Discharge: 2023-12-14 | Disposition: A | Discharge status: Home / Self Care | Payer: 59 | Source: Ambulatory Visit | Attending: Nurse Practitioner | Admitting: Nurse Practitioner

## 2023-12-14 DIAGNOSIS — K7402 Hepatic fibrosis, advanced fibrosis: Secondary | ICD-10-CM

## 2023-12-14 DIAGNOSIS — K760 Fatty (change of) liver, not elsewhere classified: Secondary | ICD-10-CM

## 2024-01-10 ENCOUNTER — Ambulatory Visit

## 2024-01-10 DIAGNOSIS — Z0000 Encounter for general adult medical examination without abnormal findings: Secondary | ICD-10-CM | POA: Diagnosis not present

## 2024-01-10 NOTE — Patient Instructions (Signed)
 Ms. Carbon , Thank you for taking time to come for your Medicare Wellness Visit. I appreciate your ongoing commitment to your health goals. Please review the following plan we discussed and let me know if I can assist you in the future.   Referrals/Orders/Follow-Ups/Clinician Recommendations: none  This is a list of the screening recommended for you and due dates:  Health Maintenance  Topic Date Due   COVID-19 Vaccine (4 - 2024-25 season) 06/11/2023   Flu Shot  05/10/2024   Medicare Annual Wellness Visit  01/09/2025   Mammogram  08/03/2025   Colon Cancer Screening  08/23/2028   DTaP/Tdap/Td vaccine (2 - Td or Tdap) 07/13/2030   Pneumonia Vaccine  Completed   DEXA scan (bone density measurement)  Completed   Hepatitis C Screening  Completed   Zoster (Shingles) Vaccine  Completed   HPV Vaccine  Aged Out    Advanced directives: (Copy Requested) Please bring a copy of your health care power of attorney and living will to the office to be added to your chart at your convenience. You can mail to [facility] [address] [place], [place] [address] or email to [email or web address]  Next Medicare Annual Wellness Visit scheduled for next year: No, office will schedule  insert Preventive Care attachment Insert FALL PREVENTION attachment if needed

## 2024-01-10 NOTE — Progress Notes (Signed)
 Subjective:   SHARAH FINNELL is a 68 y.o. who presents for a Medicare Wellness preventive visit.  Visit Complete: Virtual I connected with  Somer Trotter Kercher on 01/10/24 by a audio enabled telemedicine application and verified that I am speaking with the correct person using two identifiers.  Patient Location: Home  Provider Location: Office/Clinic  I discussed the limitations of evaluation and management by telemedicine. The patient expressed understanding and agreed to proceed.  Vital Signs: Because this visit was a virtual/telehealth visit, some criteria may be missing or patient reported. Any vitals not documented were not able to be obtained and vitals that have been documented are patient reported.  VideoError- Librarian, academic were attempted between this provider and patient, however failed, due to patient having technical difficulties OR patient did not have access to video capability.  We continued and completed visit with audio only.   Persons Participating in Visit: Patient.  AWV Questionnaire: No: Patient Medicare AWV questionnaire was not completed prior to this visit.  Cardiac Risk Factors include: advanced age (>62men, >63 women)     Objective:    Today's Vitals   There is no height or weight on file to calculate BMI.     01/10/2024   11:04 AM 10/22/2023    8:32 AM 10/07/2023    8:37 AM 12/28/2022   11:43 AM 12/23/2021    4:06 PM 09/13/2021    2:04 PM 11/03/2016    3:30 PM  Advanced Directives  Does Patient Have a Medical Advance Directive? Yes No No No No No Yes  Type of Estate agent of [place];Living will      Living will  Copy of Healthcare Power of Attorney in Chart? No - copy requested        Would patient like information on creating a medical advance directive?   No - Patient declined    No - Patient declined    Current Medications (verified) Outpatient Encounter Medications as of 01/10/2024   Medication Sig   benzonatate (TESSALON) 100 MG capsule Take 1 capsule (100 mg total) by mouth 3 (three) times daily as needed for cough. (Patient not taking: Reported on 01/10/2024)   diclofenac (VOLTAREN) 75 MG EC tablet Take 1 tablet (75 mg total) by mouth 2 (two) times daily. (Patient not taking: Reported on 01/10/2024)   methocarbamol (ROBAXIN) 500 MG tablet Take 1 tablet (500 mg total) by mouth 4 (four) times daily. (Patient not taking: Reported on 01/10/2024)   No facility-administered encounter medications on file as of 01/10/2024.    Allergies (verified) Hydrocodone, Penicillins, Percocet [oxycodone-acetaminophen], and Prednisone   History: Past Medical History:  Diagnosis Date   Abnormal LFTs (liver function tests)    Cirrhosis (HCC)    Hepatitis C virus infection    Patient stated she has been treated   Hepatitis C, chronic (HCC) 2004   genotype 1a   Hyperlipidemia    Medical history non-contributory    Thrombocytopenia (HCC)    Past Surgical History:  Procedure Laterality Date   ABDOMINAL HYSTERECTOMY     APPENDECTOMY     COLONOSCOPY  2013   IR TRANSCATHETER BX  07/09/2019   IR US GUIDE VASC ACCESS RIGHT  07/09/2019   IR VENOGRAM HEPATIC W HEMODYNAMIC EVALUATION  07/09/2019   PARTIAL HYSTERECTOMY     Family History  Problem Relation Age of Onset   Hypertension Mother    Diabetes Mother    Ovarian cancer Mother    Heart  Problems Brother    Throat cancer Brother    Heart disease Maternal Grandmother    Heart disease Maternal Grandfather    Healthy Daughter    Colon cancer Neg Hx    Breast cancer Neg Hx    Social History   Socioeconomic History   Marital status: Single    Spouse name: Not on file   Number of children: 1   Years of education: 12th   Highest education level: Not on file  Occupational History   Occupation: Field seismologist: [employer]  Tobacco Use   Smoking status: Former    Current packs/day: 0.00    Average packs/day:  0.5 packs/day for 26.0 years (13.0 ttl pk-yrs)    Types: Cigarettes    Start date: 4    Quit date: 2000    Years since quitting: 25.2   Smokeless tobacco: Never  Vaping Use   Vaping status: Never Used  Substance and Sexual Activity   Alcohol use: Not Currently    Comment: Occasionally   Drug use: No   Sexual activity: Not Currently  Other Topics Concern   Not on file  Social History Narrative   Not on file   Social Drivers of Health   Financial Resource Strain: Low Risk  (01/10/2024)   Overall Financial Resource Strain (CARDIA)    Difficulty of Paying Living Expenses: Not hard at all  Food Insecurity: No Food Insecurity (01/10/2024)   Hunger Vital Sign    Worried About Running Out of Food in the Last Year: Never true    Ran Out of Food in the Last Year: Never true  Transportation Needs: No Transportation Needs (01/10/2024)   PRAPARE - Administrator, Civil Service (Medical): No    Lack of Transportation (Non-Medical): No  Physical Activity: Inactive (01/10/2024)   Exercise Vital Sign    Days of Exercise per Week: 0 days    Minutes of Exercise per Session: 0 min  Stress: No Stress Concern Present (01/10/2024)   [employer] of Occupational Health - Occupational Stress Questionnaire    Feeling of Stress : Not at all  Social Connections: Moderately Isolated (01/10/2024)   Social Connection and Isolation Panel [NHANES]    Frequency of Communication with Friends and Family: More than three times a week    Frequency of Social Gatherings with Friends and Family: Once a week    Attends Religious Services: More than 4 times per year    Active Member of [employer] or Organizations: No    Attends Engineer, structural: Never    Marital Status: Never married    Tobacco Counseling Counseling given: Not Answered    Clinical Intake:  Pre-visit preparation completed: Yes  Pain : No/denies pain     Nutritional Risks: None Diabetes: No  Lab Results  Component  Value Date   HGBA1C 5.3 10/18/2018     How often do you need to have someone help you when you read instructions, pamphlets, or other written materials from your doctor or pharmacy?: 1 - Never  Interpreter Needed?: No  Information entered by :: NAllen LPN   Activities of Daily Living     01/10/2024   10:56 AM  In your present state of health, do you have any difficulty performing the following activities:  Hearing? 0  Vision? 1  Comment wears reading glasses  Difficulty concentrating or making decisions? 0  Walking or climbing stairs? 1  Comment due to legs  and feet  Dressing or bathing? 0  Doing errands, shopping? 0  Preparing Food and eating ? N  Using the Toilet? N  In the past six months, have you accidently leaked urine? N  Do you have problems with loss of bowel control? N  Managing your Medications? N  Managing your Finances? N  Housekeeping or managing your Housekeeping? N    Patient Care Team: Dorothyann Peng, MD as PCP - General (Internal Medicine)  Indicate any recent Medical Services you may have received from other than [facility] providers in the past year (date may be approximate).     Assessment:   This is a routine wellness examination for Yani.  Hearing/Vision screen Hearing Screening - Comments:: Denies hearing issues Vision Screening - Comments:: Regular eye exams, getting new eye doctor   Goals Addressed             This Visit's Progress    Patient Stated       01/10/2024, declines goals       Depression Screen     01/10/2024   11:06 AM 10/25/2023    2:31 PM 05/02/2023    8:48 AM 12/28/2022   11:45 AM 04/18/2022    8:49 AM 12/23/2021    4:10 PM 04/05/2021    3:45 PM  PHQ 2/9 Scores  PHQ - 2 Score 0 0 0 0 2 0 0  PHQ- 9 Score 0 0 0  6      Fall Risk     01/10/2024   11:05 AM 10/25/2023    2:31 PM 05/02/2023    8:48 AM 12/28/2022   11:45 AM 04/18/2022    8:49 AM  Fall Risk   Falls in the past year? 0 0 0 0 0  Number falls in past yr: 0 0  0 0 0  Injury with Fall? 0 0 0 0 0  Risk for fall due to : Medication side effect No Fall Risks No Fall Risks No Fall Risks No Fall Risks  Follow up Falls prevention discussed;Falls evaluation completed Falls evaluation completed Falls evaluation completed Falls prevention discussed;Education provided;Falls evaluation completed Falls evaluation completed    MEDICARE RISK AT HOME:  Medicare Risk at Home Any stairs in or around the home?: No If so, are there any without handrails?: No Home free of loose throw rugs in walkways, pet beds, electrical cords, etc?: Yes Adequate lighting in your home to reduce risk of falls?: Yes Life alert?: No Use of a cane, walker or w/c?: No Grab bars in the bathroom?: Yes Shower chair or bench in shower?: No Elevated toilet seat or a handicapped toilet?: No  TIMED UP AND GO:  Was the test performed?  No  Cognitive Function: 6CIT completed        01/10/2024   11:07 AM 12/28/2022   11:48 AM 12/23/2021    4:13 PM  6CIT Screen  What Year? 0 points 0 points 0 points  What month? 0 points 0 points 0 points  What time? 0 points 0 points 0 points  Count back from 20 0 points 0 points 0 points  Months in reverse 0 points 0 points 0 points  Repeat phrase 0 points 0 points 2 points  Total Score 0 points 0 points 2 points    Immunizations Immunization History  Administered Date(s) Administered   Fluad Quad(high Dose 65+) 08/11/2022   Fluad Trivalent(High Dose 65+) 10/25/2023   Influenza,inj,Quad PF,6+ Mos 07/31/2016, 08/03/2017, 07/21/2021   Influenza,inj,Quad PF,6-35 Mos 06/27/2019,  07/13/2020   Influenza,inj,quad, With Preservative 07/31/2016, 08/03/2017, 08/05/2018   Influenza-Unspecified 08/05/2018   PFIZER(Purple Top)SARS-COV-2 Vaccination 12/21/2019, 01/11/2020, 11/03/2020   PNEUMOCOCCAL CONJUGATE-20 04/18/2022   Tdap 07/13/2020   Zoster Recombinant(Shingrix) 05/10/2022, 07/21/2022    Screening Tests Health Maintenance  Topic Date Due    COVID-19 Vaccine (4 - 2024-25 season) 06/11/2023   INFLUENZA VACCINE  05/10/2024   Medicare Annual Wellness (AWV)  01/09/2025   MAMMOGRAM  08/03/2025   Colonoscopy  08/23/2028   DTaP/Tdap/Td (2 - Td or Tdap) 07/13/2030   Pneumonia Vaccine 20+ Years old  Completed   DEXA SCAN  Completed   Hepatitis C Screening  Completed   Zoster Vaccines- Shingrix  Completed   HPV VACCINES  Aged Out    Health Maintenance  Health Maintenance Due  Topic Date Due   COVID-19 Vaccine (4 - 2024-25 season) 06/11/2023   Health Maintenance Items Addressed: Due for covid vaccine.  Additional Screening:  Vision Screening: Recommended annual ophthalmology exams for early detection of glaucoma and other disorders of the eye.  Dental Screening: Recommended annual dental exams for proper oral hygiene  Community Resource Referral / Chronic Care Management: CRR required this visit?  No   CCM required this visit?  No     Plan:     I have personally reviewed and noted the following in the patient's chart:   Medical and social history Use of alcohol, tobacco or illicit drugs  Current medications and supplements including opioid prescriptions. Patient is not currently taking opioid prescriptions. Functional ability and status Nutritional status Physical activity Advanced directives List of other physicians Hospitalizations, surgeries, and ER visits in previous 12 months Vitals Screenings to include cognitive, depression, and falls Referrals and appointments  In addition, I have reviewed and discussed with patient certain preventive protocols, quality metrics, and best practice recommendations. A written personalized care plan for preventive services as well as general preventive health recommendations were provided to patient.     Barb Merino, LPN   06/10/4781   After Visit Summary: (Pick Up) Due to this being a telephonic visit, with patients personalized plan was offered to patient and  patient has requested to Pick up at office.  Notes: Nothing significant to report at this time.

## 2024-05-06 ENCOUNTER — Encounter: Payer: 59 | Admitting: Nurse Practitioner

## 2024-06-04 ENCOUNTER — Other Ambulatory Visit: Payer: Self-pay | Admitting: Nurse Practitioner

## 2024-06-04 DIAGNOSIS — K760 Fatty (change of) liver, not elsewhere classified: Secondary | ICD-10-CM

## 2024-06-04 DIAGNOSIS — K7402 Hepatic fibrosis, advanced fibrosis: Secondary | ICD-10-CM | POA: Diagnosis not present

## 2024-06-06 ENCOUNTER — Ambulatory Visit
Admission: RE | Admit: 2024-06-06 | Discharge: 2024-06-06 | Disposition: A | Discharge status: Home / Self Care | Source: Ambulatory Visit | Attending: Nurse Practitioner | Admitting: Nurse Practitioner

## 2024-06-06 DIAGNOSIS — K760 Fatty (change of) liver, not elsewhere classified: Secondary | ICD-10-CM

## 2024-06-06 DIAGNOSIS — K7402 Hepatic fibrosis, advanced fibrosis: Secondary | ICD-10-CM

## 2024-07-03 ENCOUNTER — Encounter (HOSPITAL_COMMUNITY): Payer: Self-pay

## 2024-07-03 ENCOUNTER — Ambulatory Visit (HOSPITAL_COMMUNITY)
Admission: EM | Admit: 2024-07-03 | Discharge: 2024-07-03 | Disposition: A | Discharge status: Home / Self Care | Attending: Physician Assistant | Admitting: Physician Assistant

## 2024-07-03 DIAGNOSIS — Z20822 Contact with and (suspected) exposure to covid-19: Secondary | ICD-10-CM

## 2024-07-03 DIAGNOSIS — J069 Acute upper respiratory infection, unspecified: Secondary | ICD-10-CM | POA: Diagnosis not present

## 2024-07-03 LAB — POC SARS CORONAVIRUS 2 AG -  ED: SARS Coronavirus 2 Ag: NEGATIVE

## 2024-07-03 NOTE — ED Provider Notes (Signed)
 [facility]    CSN: [id number] Arrival date & time: 07/03/24  1447      History   Chief Complaint Chief Complaint  Patient presents with   Nasal Congestion    HPI Taylor Santiago is a 68 y.o. female.   Patient here c/w 1 day history of subjective fever, chills, nasal congestion, rhinorrhea, myalgia, arthralgia.  No exposures to COVID/flu.  Last dose tylenol  5 hours ago.      Past Medical History:  Diagnosis Date   Abnormal LFTs (liver function tests)    Cirrhosis (HCC)    Hepatitis C virus infection    Patient stated she has been treated   Hepatitis C, chronic (HCC) 2004   genotype 1a   Hyperlipidemia    Medical history non-contributory    Thrombocytopenia     Patient Active Problem List   Diagnosis Date Noted   Annual physical exam 05/02/2023   Pruritus 05/02/2023   Hepatic fibrosis 05/02/2023   Tingling in extremities 05/02/2023   COVID-19 vaccination declined 05/02/2023   Hepatitis C carrier (HCC) 04/18/2022   Osteoarthritis of hands, bilateral 06/09/2021   Rheumatoid factor positive 05/25/2021   Thrombocytopenia 04/05/2021   Other chest pain 04/05/2021   Arthralgia 04/05/2021   History of tobacco use 04/05/2021   Smoker 02/01/2021   Hepatic steatosis 02/01/2021   Age-related osteoporosis without current pathological fracture 10/02/2018   Achilles tendinitis of left lower extremity 02/24/2016   Left hand weakness 05/15/2014   Numbness and tingling in left arm 05/15/2014   Hepatitis C virus infection cured after antiviral drug therapy 05/15/2014    Past Surgical History:  Procedure Laterality Date   ABDOMINAL HYSTERECTOMY     APPENDECTOMY     COLONOSCOPY  2013   IR TRANSCATHETER BX  07/09/2019   IR US  GUIDE VASC ACCESS RIGHT  07/09/2019   IR VENOGRAM HEPATIC W HEMODYNAMIC EVALUATION  07/09/2019   PARTIAL HYSTERECTOMY      OB History     Gravida  1   Para  1   Term      Preterm      AB      Living  1      SAB       IAB      Ectopic      Multiple      Live Births               Home Medications    Prior to Admission medications   Not on File    Family History Family History  Problem Relation Age of Onset   Hypertension Mother    Diabetes Mother    Ovarian cancer Mother    Heart Problems Brother    Throat cancer Brother    Heart disease Maternal Grandmother    Heart disease Maternal Grandfather    Healthy Daughter    Colon cancer Neg Hx    Breast cancer Neg Hx     Social History Social History   Tobacco Use   Smoking status: Former    Current packs/day: 0.00    Average packs/day: 0.5 packs/day for 26.0 years (13.0 ttl pk-yrs)    Types: Cigarettes    Start date: 76    Quit date: 2000    Years since quitting: 25.7   Smokeless tobacco: Never  Vaping Use   Vaping status: Never Used  Substance Use Topics   Alcohol use: Not Currently    Comment: Occasionally   Drug use: No  Allergies   Hydrocodone, Penicillins, Percocet [oxycodone -acetaminophen ], and Prednisone    Review of Systems Review of Systems  Constitutional:  Positive for chills, fatigue and fever.  HENT:  Positive for congestion and rhinorrhea. Negative for ear pain, nosebleeds, postnasal drip, sinus pressure, sinus pain and sore throat.   Eyes:  Negative for pain and redness.  Respiratory:  Positive for cough. Negative for shortness of breath and wheezing.   Gastrointestinal:  Negative for abdominal distention, abdominal pain, diarrhea, nausea and vomiting.  Musculoskeletal:  Positive for arthralgias and myalgias.  Skin:  Negative for rash.  Neurological:  Negative for light-headedness and headaches.  Hematological:  Negative for adenopathy. Does not bruise/bleed easily.  Psychiatric/Behavioral:  Negative for confusion and sleep disturbance.      Physical Exam Triage Vital Signs ED Triage Vitals  Encounter Vitals Group     BP 07/03/24 1525 115/74     Girls Systolic BP Percentile --      Girls  Diastolic BP Percentile --      Boys Systolic BP Percentile --      Boys Diastolic BP Percentile --      Pulse Rate 07/03/24 1525 75     Resp 07/03/24 1525 16     Temp 07/03/24 1525 98.4 F (36.9 C)     Temp Source 07/03/24 1525 Oral     SpO2 07/03/24 1525 94 %     Weight --      Height --      Head Circumference --      Peak Flow --      Pain Score 07/03/24 1523 9     Pain Loc --      Pain Education --      Exclude from Growth Chart --    No data found.  Updated Vital Signs BP 115/74 (BP Location: Right Arm)   Pulse 75   Temp 98.4 F (36.9 C) (Oral)   Resp 16   SpO2 94%   Visual Acuity Right Eye Distance:   Left Eye Distance:   Bilateral Distance:    Right Eye Near:   Left Eye Near:    Bilateral Near:     Physical Exam Vitals and nursing note reviewed.  Constitutional:      General: She is not in acute distress.    Appearance: Normal appearance. She is not ill-appearing.  HENT:     Head: Normocephalic and atraumatic.     Right Ear: Tympanic membrane and ear canal normal.     Left Ear: Tympanic membrane and ear canal normal.     Nose: No congestion or rhinorrhea.     Mouth/Throat:     Pharynx: No oropharyngeal exudate or posterior oropharyngeal erythema.  Eyes:     General: No scleral icterus.    Extraocular Movements: Extraocular movements intact.     Conjunctiva/sclera: Conjunctivae normal.  Cardiovascular:     Rate and Rhythm: Normal rate and regular rhythm.     Heart sounds: No murmur heard. Pulmonary:     Effort: Pulmonary effort is normal. No respiratory distress.     Breath sounds: Normal breath sounds. No wheezing or rales.  Musculoskeletal:     Cervical back: Normal range of motion. No rigidity.  Lymphadenopathy:     Cervical: No cervical adenopathy.  Skin:    Capillary Refill: Capillary refill takes less than 2 seconds.     Coloration: Skin is not jaundiced.     Findings: No rash.  Neurological:     General: No  focal deficit present.      Mental Status: She is alert and oriented to person, place, and time.     Motor: No weakness.     Gait: Gait normal.  Psychiatric:        Mood and Affect: Mood normal.        Behavior: Behavior normal.      UC Treatments / Results  Labs (all labs ordered are listed, but only abnormal results are displayed) Labs Reviewed  POC SARS CORONAVIRUS 2 AG -  ED    EKG   Radiology No results found.  Procedures Procedures (including critical care time)  Medications Ordered in UC Medications - No data to display  Initial Impression / Assessment and Plan / UC Course  I have reviewed the triage vital signs and the nursing notes.  Pertinent labs & imaging results that were available during my care of the patient were reviewed by me and considered in my medical decision making (see chart for details).     Discussed possibility of false negative Return PRN, new or worsening symptoms Final Clinical Impressions(s) / UC Diagnoses   Final diagnoses:  Viral URI  Lab test negative for COVID-19 virus     Discharge Instructions      Recommend repeat COVID testing in 24 - 48 hours Take zyrtec -D as needed for nasal congestion     ED Prescriptions   None    PDMP not reviewed this encounter.   Juleen Rush, PA-C 07/03/24 1631

## 2024-07-03 NOTE — Discharge Instructions (Addendum)
 Recommend repeat COVID testing in 24 - 48 hours Take zyrtec -D as needed for nasal congestion

## 2024-07-03 NOTE — ED Triage Notes (Signed)
 Patient here today with c/o nasal congestion, ST, cough, body aches, sweats, and headache X 1 day. Patient has taken Tylenol  and Dayquil with some relief. No known sick contacts. Patient rides the city busses though.

## 2024-07-04 ENCOUNTER — Other Ambulatory Visit: Payer: Self-pay | Admitting: Internal Medicine

## 2024-07-04 DIAGNOSIS — Z1231 Encounter for screening mammogram for malignant neoplasm of breast: Secondary | ICD-10-CM

## 2024-08-05 ENCOUNTER — Ambulatory Visit

## 2024-08-08 ENCOUNTER — Ambulatory Visit
Admission: RE | Admit: 2024-08-08 | Discharge: 2024-08-08 | Disposition: A | Discharge status: Home / Self Care | Source: Ambulatory Visit | Attending: Internal Medicine | Admitting: Internal Medicine

## 2024-08-08 DIAGNOSIS — Z1231 Encounter for screening mammogram for malignant neoplasm of breast: Secondary | ICD-10-CM

## 2024-09-04 ENCOUNTER — Encounter: Payer: Self-pay | Admitting: Nurse Practitioner

## 2024-09-16 ENCOUNTER — Encounter: Admitting: Nurse Practitioner

## 2024-09-18 ENCOUNTER — Encounter: Admitting: Internal Medicine

## 2024-10-13 ENCOUNTER — Ambulatory Visit (HOSPITAL_COMMUNITY): Admission: EM | Admit: 2024-10-13 | Discharge: 2024-10-13 | Disposition: A | Discharge status: Home / Self Care

## 2024-10-13 ENCOUNTER — Other Ambulatory Visit: Payer: Self-pay

## 2024-10-13 ENCOUNTER — Encounter (HOSPITAL_COMMUNITY): Payer: Self-pay | Admitting: *Deleted

## 2024-10-13 DIAGNOSIS — B349 Viral infection, unspecified: Secondary | ICD-10-CM

## 2024-10-13 LAB — POC SOFIA SARS ANTIGEN FIA: SARS Coronavirus 2 Ag: NEGATIVE

## 2024-10-13 LAB — POCT INFLUENZA A/B
Influenza A, POC: NEGATIVE
Influenza B, POC: NEGATIVE

## 2024-10-13 NOTE — Discharge Instructions (Signed)
" °  1. Acute viral syndrome (Primary) - POC Influenza A/B completed today in UC is negative for influenza - POC SARS Coronavirus completed today in UC is negative for coronavirus. - Continue with current over-the-counter medication therapy for symptoms of acute viral syndrome.  -Continue to monitor symptoms for any change in severity if there is any escalation of current symptoms or development of new symptoms follow-up in ER for further evaluation and management. "

## 2024-10-13 NOTE — ED Triage Notes (Signed)
 PT reports for 3 days she has had a fever,chills,cough,body aches and ear pain

## 2024-10-13 NOTE — ED Provider Notes (Signed)
 " [facility] [place]  Note:  This document was prepared using Dragon voice recognition software and may include unintentional dictation errors.  MRN: [id number] DOB: [birth year]  Subjective:   Taylor Santiago is a 69 y.o. female presenting for cough, body aches, chills, fever, ear pain x 3 days.  Patient denies any known sick contacts but states she did go to [employer] and may have been exposed to sick contacts there.  Patient reports she has been taking several over-the-counter cough and cold remedies with minimal improvement to symptoms.  No shortness of breath, chest pain, weakness, dizziness.  Current Medications[1]   Allergies[2]  Past Medical History:  Diagnosis Date   Abnormal LFTs (liver function tests)    Cirrhosis (HCC)    Hepatitis C virus infection    Patient stated she has been treated   Hepatitis C, chronic (HCC) 2004   genotype 1a   Hyperlipidemia    Medical history non-contributory    Thrombocytopenia      Past Surgical History:  Procedure Laterality Date   ABDOMINAL HYSTERECTOMY     APPENDECTOMY     COLONOSCOPY  2013   IR TRANSCATHETER BX  07/09/2019   IR US  GUIDE VASC ACCESS RIGHT  07/09/2019   IR VENOGRAM HEPATIC W HEMODYNAMIC EVALUATION  07/09/2019   PARTIAL HYSTERECTOMY      Family History  Problem Relation Age of Onset   Hypertension Mother    Diabetes Mother    Ovarian cancer Mother    Heart Problems Brother    Throat cancer Brother    Heart disease Maternal Grandmother    Heart disease Maternal Grandfather    Healthy Daughter    Colon cancer Neg Hx    Breast cancer Neg Hx     Social History[3]  ROS Refer to HPI for ROS details.  Objective:    Vitals: BP 136/77   Pulse 72   Temp 99.3 F (37.4 C)   Resp 18   SpO2 93%   Physical Exam Vitals and nursing note reviewed.  Constitutional:      General: She is not in acute distress.    Appearance: Normal appearance. She is well-developed. She is not ill-appearing or  toxic-appearing.  HENT:     Head: Normocephalic and atraumatic.     Right Ear: Tympanic membrane, ear canal and external ear normal.     Left Ear: Tympanic membrane, ear canal and external ear normal.     Nose: Congestion present.     Mouth/Throat:     Mouth: Mucous membranes are moist.     Pharynx: Oropharynx is clear.  Cardiovascular:     Rate and Rhythm: Normal rate and regular rhythm.     Heart sounds: Normal heart sounds. No murmur heard. Pulmonary:     Effort: Pulmonary effort is normal. No respiratory distress.     Breath sounds: Normal breath sounds. No stridor. No wheezing, rhonchi or rales.  Chest:     Chest wall: No tenderness.  Skin:    General: Skin is warm and dry.  Neurological:     General: No focal deficit present.     Mental Status: She is alert and oriented to person, place, and time.  Psychiatric:        Mood and Affect: Mood normal.        Behavior: Behavior normal.     Procedures  Results for orders placed or performed during the hospital encounter of 10/13/24 (from the past 24 hours)  POC Influenza  A/B     Status: None   Collection Time: 10/13/24 10:15 AM  Result Value Ref Range   Influenza A, POC Negative Negative   Influenza B, POC Negative Negative  POC SARS Coronavirus Ag     Status: None   Collection Time: 10/13/24 10:15 AM  Result Value Ref Range   SARS Coronavirus 2 Ag Negative Negative    Assessment and Plan :     Discharge Instructions       1. Acute viral syndrome (Primary) - POC Influenza A/B completed today in UC is negative for influenza - POC SARS Coronavirus completed today in UC is negative for coronavirus. - Continue with current over-the-counter medication therapy for symptoms of acute viral syndrome.  -Continue to monitor symptoms for any change in severity if there is any escalation of current symptoms or development of new symptoms follow-up in ER for further evaluation and management.      Anaisa Radi B Salene Mohamud     [1] No current facility-administered medications for this encounter. No current outpatient medications on file. [2]  Allergies Allergen Reactions   Hydrocodone Itching   Penicillins Hives    Sweats Has patient had a PCN reaction causing immediate rash, facial/tongue/throat swelling, SOB or lightheadedness with hypotension: No Has patient had a PCN reaction causing severe rash involving mucus membranes or skin necrosis:No Has patient had a PCN reaction that required hospitalization No Has patient had a PCN reaction occurring within the last 10 years: no   If all of the above answers are NO, then may proceed with Cephalosporin use.    Percocet [Oxycodone -Acetaminophen ]    Prednisone  Rash  [3]  Social History Tobacco Use   Smoking status: Former    Current packs/day: 0.00    Average packs/day: 0.5 packs/day for 26.0 years (13.0 ttl pk-yrs)    Types: Cigarettes    Start date: 53    Quit date: 2000    Years since quitting: 26.0   Smokeless tobacco: Never  Vaping Use   Vaping status: Never Used  Substance Use Topics   Alcohol use: Not Currently    Comment: Occasionally   Drug use: No     Aurea Goodell B, NP 10/13/24 1041  "

## 2024-11-05 ENCOUNTER — Encounter: Admitting: Internal Medicine

## 2024-12-02 ENCOUNTER — Encounter: Payer: Self-pay | Admitting: Internal Medicine

## 2025-02-05 ENCOUNTER — Ambulatory Visit

## 2025-02-05 ENCOUNTER — Ambulatory Visit: Payer: Self-pay
